# Patient Record
Sex: Female | Born: 1945
Health system: Southern US, Community
[De-identification: ages and names within clinical notes are randomized; demographics above are authoritative.]

## PROBLEM LIST (undated history)

## (undated) DIAGNOSIS — E669 Obesity, unspecified: Secondary | ICD-10-CM

## (undated) DIAGNOSIS — R7401 Elevation of levels of liver transaminase levels: Secondary | ICD-10-CM

## (undated) DIAGNOSIS — R740 Nonspecific elevation of levels of transaminase and lactic acid dehydrogenase [LDH]: Secondary | ICD-10-CM

## (undated) DIAGNOSIS — M1990 Unspecified osteoarthritis, unspecified site: Secondary | ICD-10-CM

## (undated) DIAGNOSIS — I10 Essential (primary) hypertension: Secondary | ICD-10-CM

## (undated) DIAGNOSIS — Z952 Presence of prosthetic heart valve: Secondary | ICD-10-CM

## (undated) DIAGNOSIS — E781 Pure hyperglyceridemia: Secondary | ICD-10-CM

## (undated) DIAGNOSIS — I872 Venous insufficiency (chronic) (peripheral): Secondary | ICD-10-CM

## (undated) DIAGNOSIS — Z853 Personal history of malignant neoplasm of breast: Secondary | ICD-10-CM

## (undated) DIAGNOSIS — Z96653 Presence of artificial knee joint, bilateral: Secondary | ICD-10-CM

## (undated) DIAGNOSIS — R011 Cardiac murmur, unspecified: Secondary | ICD-10-CM

## (undated) HISTORY — DX: Nonspecific elevation of levels of transaminase and lactic acid dehydrogenase (ldh): R74.0

## (undated) HISTORY — PX: REPLACEMENT TOTAL KNEE BILATERAL: SUR1225

## (undated) HISTORY — DX: Presence of artificial knee joint, bilateral: Z96.653

## (undated) HISTORY — DX: Presence of prosthetic heart valve: Z95.2

## (undated) HISTORY — DX: Venous insufficiency (chronic) (peripheral): I87.2

## (undated) HISTORY — DX: Personal history of malignant neoplasm of breast: Z85.3

## (undated) HISTORY — DX: Unspecified osteoarthritis, unspecified site: M19.90

## (undated) HISTORY — DX: Elevation of levels of liver transaminase levels: R74.01

## (undated) HISTORY — DX: Pure hyperglyceridemia: E78.1

## (undated) HISTORY — DX: Obesity, unspecified: E66.9

## (undated) HISTORY — DX: Cardiac murmur, unspecified: R01.1

## (undated) HISTORY — DX: Essential (primary) hypertension: I10

---

## 1984-04-18 HISTORY — PX: TOTAL ABDOMINAL HYSTERECTOMY: SHX209

## 1984-04-18 HISTORY — PX: APPENDECTOMY: SHX54

## 2010-05-03 ENCOUNTER — Encounter: Payer: Self-pay | Admitting: Family Medicine

## 2010-05-05 ENCOUNTER — Encounter: Payer: Self-pay | Admitting: Family Medicine

## 2010-05-05 ENCOUNTER — Ambulatory Visit
Admission: RE | Admit: 2010-05-05 | Discharge: 2010-05-05 | Payer: Self-pay | Source: Home / Self Care | Attending: Family Medicine | Admitting: Family Medicine

## 2010-05-05 DIAGNOSIS — E118 Type 2 diabetes mellitus with unspecified complications: Secondary | ICD-10-CM | POA: Insufficient documentation

## 2010-05-05 DIAGNOSIS — R740 Nonspecific elevation of levels of transaminase and lactic acid dehydrogenase [LDH]: Secondary | ICD-10-CM

## 2010-05-05 DIAGNOSIS — I10 Essential (primary) hypertension: Secondary | ICD-10-CM | POA: Insufficient documentation

## 2010-05-05 LAB — CONVERTED CEMR LAB
Alkaline Phosphatase: 71 units/L (ref 39–117)
BUN: 18 mg/dL (ref 6–23)
CO2: 28 meq/L (ref 19–32)
Cholesterol: 157 mg/dL (ref 0–200)
Creatinine, Ser: 0.78 mg/dL (ref 0.40–1.20)
Glucose, Bld: 154 mg/dL — ABNORMAL HIGH (ref 70–99)
HDL: 41 mg/dL (ref >39)
LDL Cholesterol: 92 mg/dL (ref 0–99)
Total Bilirubin: 0.6 mg/dL (ref 0.3–1.2)
Total CHOL/HDL Ratio: 3.8
Total Protein: 7.7 g/dL (ref 6.0–8.3)
Triglycerides: 118 mg/dL (ref <150)
VLDL: 24 mg/dL (ref 0–40)

## 2010-05-06 ENCOUNTER — Telehealth: Payer: Self-pay | Admitting: Family Medicine

## 2010-05-20 NOTE — Assessment & Plan Note (Signed)
Summary: routine checkup/vfw   Vital Signs:  Patient profile:   65 year old female Height:      63 inches Weight:      283 pounds BMI:     50.31 O2 Sat:      93 % on Room air Pulse rate:   77 / minute BP sitting:   152 / 89  (right arm) Cuff size:   large  Vitals Entered By: Francee Piccolo CMA Duncan Dull) (May 05, 2010 8:22 AM)  O2 Flow:  Room air CC: new to establish, refill glucovance//SP Is Patient Diabetic? Yes Did you bring your meter with you today? No   History of Present Illness:  Type 2 diabetes mellitus follow-up      This is a 65 year old woman who presents with Type 2 diabetes mellitus follow-up.  The patient denies polyuria, polydipsia, blurred vision, self managed hypoglycemia, hypoglycemia requiring help, weight loss, weight gain, and numbness of extremities.  She has intermittent burning/tingling in her toes.  Moderate compliance with diet, no regular exercise.  Checks glucose fasting AM: avg 130-140. Random later in day usually 140s.  No hypoglyc.  Rare gluc > 200.   Hips not bothering her lately at all, nor is her low back. She has her BP checked by a friend who is studying nursing and reports normal readings recently.  Past checks in my office have been mildly elevated more than   We discussed vaccines and routine screenings for her today and she declined everything despite being told of the potential risks/benefits of each (flu vaccine, mammogram, colon cancer screening, bone densitometry).  She did say she would do a bone density test but wants to put it off for now.  Preventive Screening-Counseling & Management  Alcohol-Tobacco     Alcohol drinks/day: 0     Smoking Status: quit  Current Medications (verified): 1)  Glucovance 5-500 Mg Tabs (Glyburide-Metformin) .... 2 Tabs By Mouth Bid 2)  Aspirin 81 Mg Tbec (Aspirin) .Marland Kitchen.. 1 Tab By Mouth Qd 3)  Vitamin E 1000 Unit Caps (Vitamin E) .... Take 1 Capsule By Mouth Once A Day 4)  Vitamin C Cr 1500 Mg  Cr-Tabs (Ascorbic Acid) .... Take 1 Tablet By Mouth Once A Day 5)  Cranberry 500 Mg Caps (Cranberry) .... Take 1 Capsule By Mouth Once A Day 6)  Coenzyme Q10 100 Mg Caps (Coenzyme Q10) .... Take 1 Capsule By Mouth Once A Day 7)  Biotin 300 Mcg Tabs (Biotin) .... Take 1 Tablet By Mouth Once A Day 8)  Niacin Flush Free 500 Mg Caps (Inositol Niacinate) .... Take 1 Capsule By Mouth Once A Day  Allergies (verified): 1)  ! Pcn  Past History:  Past Surgical History: TAH & Hysterectomy 1986 Appendectomy 1986  Family History: Mother: DM 2, CHF Father: CHF Twin brother: dysrythmia Brother: alcoholism  Younger brother: MS Sister: celiac disease  Social History: Smoking Status:  quit  Review of Systems  The patient denies anorexia, fever, weight loss, weight gain, vision loss, decreased hearing, hoarseness, chest pain, syncope, dyspnea on exertion, peripheral edema, prolonged cough, headaches, hemoptysis, abdominal pain, melena, hematochezia, severe indigestion/heartburn, hematuria, incontinence, genital sores, muscle weakness, suspicious skin lesions, transient blindness, difficulty walking, depression, unusual weight change, abnormal bleeding, enlarged lymph nodes, angioedema, and breast masses.    Physical Exam  General:  VS: noted, all normal except BP 152/89 Gen: Alert, well appearing, oriented x 4. HEENT: Scalp without lesions or hair loss.  Ears: EACs clear, normal epithelium.  TMs with good light reflex and landmarks bilaterally.  Eyes: no injection, icteris, swelling, or exudate.  EOMI, PERRLA. Nose: no drainage or turbinate edema/swelling.  No inection or focal lesion.  Mouth: lips without lesion/swelling.  Oral mucosa pink and moist.  Dentition intact and without obvious caries or gingival swelling.  Oropharynx without erythema, exudate, or swelling.  Neck: supple.  No lymphadenopathy, thyromegaly, or mass.  No bruits. Chest: symmetric expansion, with nonlabored respirations.   Clear and equal breath sounds in all lung fields.   CV: RRR, 1-2/6 systolic murmur heard best at RUSB.  S1 and S2 normal.  No /r/g.  Peripheral pulses 2+/symmetric. EXT: no clubbing, cyanosis, or edema.     Impression & Recommendations:  Problem # 1:  DIAB W/NEURO MANIFESTS TYPE II/UNS NOT UNCNTRL (ICD-250.60) Assessment Unchanged She is current on her urine microalbumin/cr and her eye exam. Continue current meds, encouraged stricter compliance with diet and START exercising.  Continue two times a day home gluc monitoring. Her updated medication list for this problem includes:    Glucovance 5-500 Mg Tabs (Glyburide-metformin) .Marland Kitchen... 2 tabs by mouth bid    Aspirin 81 Mg Tbec (Aspirin) .Marland Kitchen... 1 tab by mouth qd  Orders: T-Comprehensive Metabolic Panel [id number]) T-Lipid Profile [phone]) T- Hemoglobin A1C ([id number])  Problem # 2:  ELEVATED BP READING WITHOUT DX HYPERTENSION (ICD-796.2) Assessment: Unchanged Recommended EKG to look for LVH, but she refused this today (said she can't breath when lying on her back and also said she would not take off her blouse).   She'll continue home monitoring of her BP a few times a week and call or return if they are >140/90.  Problem # 3:  NONSPEC ELEVATION OF LEVELS OF TRANSAMINASE/LDH (ICD-790.4) Assessment: Unchanged These were barely 2X normal last check in 07/2009.  Presumably fatty liver, but pt not interested in w/u of this at this time.  Orders: T-Comprehensive Metabolic Panel ([id number])  Problem # 4:  OBESITY, MORBID (ICD-278.01) Assessment: Unchanged Her wt is 3 lb down from 07/2009. Emphasized importance of consistent compliance with diet/exercise...Marland KitchenMarland Kitchenlifestyle change.  Complete Medication List: 1)  Glucovance 5-500 Mg Tabs (Glyburide-metformin) .... 2 tabs by mouth bid 2)  Aspirin 81 Mg Tbec (Aspirin) .Marland Kitchen.. 1 tab by mouth qd 3)  Vitamin E 1000 Unit Caps (Vitamin e) .... Take 1 capsule by mouth once a day 4)  Vitamin C Cr  1500 Mg Cr-tabs (Ascorbic acid) .... Take 1 tablet by mouth once a day 5)  Cranberry 500 Mg Caps (Cranberry) .... Take 1 capsule by mouth once a day 6)  Coenzyme Q10 100 Mg Caps (Coenzyme q10) .... Take 1 capsule by mouth once a day 7)  Biotin 300 Mcg Tabs (Biotin) .... Take 1 tablet by mouth once a day 8)  Niacin Flush Free 500 Mg Caps (Inositol niacinate) .... Take 1 capsule by mouth once a day  Patient Instructions: 1)  Take orders to [facility] on [address]--(please give directions) 2)  Take a women's multivitamin daily and 3 tums daily. 3)  Recheck in office in 6 months. Prescriptions: GLUCOVANCE 5-500 MG TABS (GLYBURIDE-METFORMIN) 2 tabs by mouth bid  #360 x 1   Entered and Authorized by:   Michell Heinrich M.D.   Signed by:   Michell Heinrich M.D. on 05/05/2010   Method used:   Print then Give to Patient   RxID:   [id number]    Orders Added: 1)  T-Comprehensive Metabolic Panel [[id number]] 2)  T-Lipid Profile [[id number]] 3)  T- Hemoglobin A1C [[id number]] 4)  Est. Patient Level IV [04540]

## 2010-05-20 NOTE — Miscellaneous (Signed)
  Clinical Lists Changes  Medications: Added new medication of GLUCOVANCE 5-500 MG TABS (GLYBURIDE-METFORMIN) 2 tabs by mouth bid Added new medication of ASPIRIN 81 MG TBEC (ASPIRIN) 1 tab by mouth qd Observations: Added new observation of SOCIAL HX: Married, no children. Active in church ministry/missionary. No T/A/Ds. (05/03/2010 16:18) Added new observation of PAST MED HX: DM 2 (A1c 07/2009 8.7%.  Urine pro/cr NEG 07/2009.  Eye exam 08/2009, No D.R.) Obesity Osteoarthritis (low back, hips, knees) Hypertriglyceridemia Transaminasemia (mild 07/2009)--hep panel neg. (05/03/2010 16:18) Added new observation of PAST SURG HX: Hysterectomy 1986 (05/03/2010 16:18)       Past History:  Past Medical History: DM 2 (A1c 07/2009 8.7%.  Urine pro/cr NEG 07/2009.  Eye exam 08/2009, No D.R.) Obesity Osteoarthritis (low back, hips, knees) Hypertriglyceridemia Transaminasemia (mild 07/2009)--hep panel neg.  Past Surgical History: Hysterectomy 1986   Social History: Married, no children. Active in church ministry/missionary. No T/A/Ds.

## 2010-05-20 NOTE — Progress Notes (Signed)
Summary: Lab results  Phone Note Other Incoming   Summary of Call: Please notify: her labs showed that her cholesterol level, kidney function, and liver function are all normal.  This is great.  However, her HbA1c---the number that shows how well her sugar has been controlled over the last 3 months---was 8.1%.  This means her average sugar level during this time was 180.  This is better than back in April 2011 when we last checked it, but her goal is to get this down below 7%. Tell her I encourage her to start exercising (she can start with a daily walk, start small and work up to 30 minutes every day), and try to remain faithful to a strict diabetic diet.  Keep taking the glucovance like she is doing, and I also recommend an additional diabetes pill: start Onglyza 2.5mg  once daily.  Continue checking sugar twice per day.  Follow up in office in 459mo instead of 59mo.  If she agrees to the med, let me know and I'll do e-RX.  Also, please mail her a HbA1c info sheet that I'll put on your keyboard.  Thanks Initial call taken by: Michell Heinrich M.D.,  May 06, 2010 8:31 AM  Follow-up for Phone Call        LM to [facility] at home number Francee Piccolo CMA Duncan Dull)  May 06, 2010 4:52 PM   RC from pt.  Notified of above.  She is agreeable with this plan.  Pt requests a copy of labs to be mailed to her. Labs printed and mailed.  Onglyza rx sent. Follow-up by: Francee Piccolo CMA (AAMA),  May 07, 2010 9:08 AM    New/Updated Medications: ONGLYZA 2.5 MG TABS (SAXAGLIPTIN HCL) Take 1 tablet by mouth once a day Prescriptions: ONGLYZA 2.5 MG TABS (SAXAGLIPTIN HCL) Take 1 tablet by mouth once a day  #30 x 2   Entered by:   Francee Piccolo CMA (AAMA)   Authorized by:   Michell Heinrich M.D.   Signed by:   Francee Piccolo CMA (AAMA) on 05/07/2010   Method used:   Electronically to        [facility]* (retail)       [address] [place],  [place]  [address]       Ph: [phone]       Fax: [phone]   RxID:   [id number]

## 2010-06-25 ENCOUNTER — Encounter: Payer: Self-pay | Admitting: *Deleted

## 2010-08-27 ENCOUNTER — Telehealth: Payer: Self-pay | Admitting: *Deleted

## 2010-08-27 DIAGNOSIS — E1149 Type 2 diabetes mellitus with other diabetic neurological complication: Secondary | ICD-10-CM

## 2010-08-27 MED ORDER — SAXAGLIPTIN HCL 2.5 MG PO TABS: 2.5000 mg | ORAL_TABLET | Freq: Every day | ORAL | Status: DC

## 2010-08-27 NOTE — Telephone Encounter (Signed)
Pt needs refill of Onglyza until follow up in July.  Pt states she has started walking 5 minutes twice a day.  Her FBS's are down to 98-112 and her 3pm CBG's are between 110-120.  She is feeling better and can't wait to see what numbers are in July.  Refill sent thru July.

## 2010-10-27 ENCOUNTER — Ambulatory Visit (INDEPENDENT_AMBULATORY_CARE_PROVIDER_SITE_OTHER): Payer: Medicare Other | Admitting: Family Medicine

## 2010-10-27 ENCOUNTER — Encounter: Payer: Self-pay | Admitting: Family Medicine

## 2010-10-27 VITALS — BP 137/78 | HR 79 | Ht 63.0 in | Wt 277.0 lb

## 2010-10-27 DIAGNOSIS — R7402 Elevation of levels of lactic acid dehydrogenase (LDH): Secondary | ICD-10-CM

## 2010-10-27 DIAGNOSIS — R7401 Elevation of levels of liver transaminase levels: Secondary | ICD-10-CM

## 2010-10-27 DIAGNOSIS — B351 Tinea unguium: Secondary | ICD-10-CM

## 2010-10-27 DIAGNOSIS — Z23 Encounter for immunization: Secondary | ICD-10-CM

## 2010-10-27 DIAGNOSIS — E1149 Type 2 diabetes mellitus with other diabetic neurological complication: Secondary | ICD-10-CM

## 2010-10-27 DIAGNOSIS — E119 Type 2 diabetes mellitus without complications: Secondary | ICD-10-CM

## 2010-10-27 DIAGNOSIS — I872 Venous insufficiency (chronic) (peripheral): Secondary | ICD-10-CM

## 2010-10-27 LAB — BASIC METABOLIC PANEL
BUN: 15 mg/dL (ref 6–23)
Calcium: 10.1 mg/dL (ref 8.4–10.5)
Creat: 0.89 mg/dL (ref 0.50–1.10)
Glucose, Bld: 147 mg/dL — ABNORMAL HIGH (ref 70–99)

## 2010-10-27 LAB — MICROALBUMIN / CREATININE URINE RATIO: Microalb Creat Ratio: 9.8 mg/g (ref 0.0–30.0)

## 2010-10-27 LAB — HEPATIC FUNCTION PANEL
AST: 28 U/L (ref 0–37)
Alkaline Phosphatase: 68 U/L (ref 39–117)
Indirect Bilirubin: 0.4 mg/dL (ref 0.0–0.9)
Total Bilirubin: 0.6 mg/dL (ref 0.3–1.2)

## 2010-10-27 LAB — HEMOGLOBIN A1C: Hgb A1c MFr Bld: 7 % — ABNORMAL HIGH (ref <5.7)

## 2010-10-27 MED ORDER — ZOSTER VACCINE LIVE 19400 UNT/0.65ML ~~LOC~~ SOLR: 0.6500 mL | Freq: Once | SUBCUTANEOUS | Status: DC

## 2010-10-27 MED ORDER — FUROSEMIDE 40 MG PO TABS: 40.0000 mg | ORAL_TABLET | Freq: Every day | ORAL | Status: DC

## 2010-10-27 MED ORDER — TERBINAFINE HCL 250 MG PO TABS: ORAL_TABLET | ORAL | Status: DC

## 2010-10-27 MED ORDER — GLYBURIDE-METFORMIN 5-500 MG PO TABS: 2.0000 | ORAL_TABLET | Freq: Two times a day (BID) | ORAL | Status: DC

## 2010-10-27 MED ORDER — TETANUS-DIPHTH-ACELL PERTUSSIS 5-2.5-18.5 LF-MCG/0.5 IM SUSP: 0.5000 mL | Freq: Once | INTRAMUSCULAR | Status: DC

## 2010-10-27 NOTE — Assessment & Plan Note (Signed)
Will call in lamisil tabs x 12 wks. Need to add on hepatic panel to labs today for baseline prior to starting lamisil since she does have hx of mild transaminasemia.

## 2010-10-27 NOTE — Progress Notes (Signed)
OFFICE VISIT  10/27/2010   CC:  Chief Complaint  Patient presents with  . Hypertension  . Diabetes     HPI:    Patient is a 65 y.o. Caucasian female who presents for DM 2 f/u. Says glucoses (checks tid) are much better: usually around 100 fasting, no higher than 130s later in the day except very rare 170s reading. Has stopped the onglyza about 3 wks ago b/c sugars doing good.  Has decreased her glucovance to 1 tab bid instead of 2 bid. Has made some dietary changes, nutrisystem--moderately high protein diet lately.  Fasting today.  Says feet (mainly toes) do tingle and burn constantly.  Also struggles with chronic LE edema bilat. Exercise: walks regularly.  Past Medical History  Diagnosis Date  . Diabetes mellitus     Type 2 (A1C 04/2010 8.1%.  Urine pro/cr Neg 4-11.)  . Obesity   . Osteoarthritis     low back, hips, knees  . Hypertriglyceridemia   . Transaminasemia mild 4/11    hep panel- neg    Past Surgical History  Procedure Date  . Total abdominal hysterectomy 1986  . Appendectomy 1986    Outpatient Prescriptions Prior to Visit  Medication Sig Dispense Refill  . Ascorbic Acid (VITAMIN C CR) 1500 MG TBCR Take 1 tablet by mouth daily.        Marland Kitchen aspirin 81 MG tablet Take 81 mg by mouth daily.        . Biotin 300 MCG TABS Take 1 tablet by mouth daily.        . Coenzyme Q10 100 MG CPCR Take 1 capsule by mouth.       . vitamin E 1000 UNIT capsule Take 1,000 Units by mouth daily.        . Cranberry 500 MG CAPS Take 1 capsule by mouth daily.        . Inositol Niacinate (NIACIN FLUSH FREE) 500 MG CAPS Take 1 capsule by mouth daily.        Marland Kitchen glyBURIDE-metformin (GLUCOVANCE) 5-500 MG per tablet Take 2 tablets by mouth 2 (two) times daily.        . saxagliptin HCl (ONGLYZA) 2.5 MG TABS tablet Take 1 tablet (2.5 mg total) by mouth daily.  30 tablet  1   No facility-administered medications prior to visit.    Allergies  Allergen Reactions  . Penicillins     REACTION:  neck/throat swelling    ROS As per HPI  PE: Blood pressure 137/78, pulse 79, height 5\' 3"  (1.6 m), weight 277 lb (125.646 kg). Gen: Alert, well appearing.  Patient is oriented to person, place, time, and situation. LEGS: 2+ pitting edema in both lower legs, with some varicosities and mild skin freckling and hyperkeratosis from chronic venous insuff edema. Feet pink, diffusely flaky, one small callus in metatarsal head region on left plantar surface.  Mildly TTP here.  No ulceration.  No deformity. Nails all thickened and crumbling.  Sensation intact.  Puffiness of feet and ankles prevent adequate pulse check.  LABS:  None today  IMPRESSION AND PLAN:  DIAB W/NEURO MANIFESTS TYPE II/UNS NOT UNCNTRL Improved as per her home monitoring. Check HbA1c today and check annual urine microalb/cr ratio today.  Chronic venous insufficiency Discussed dx today, discussed low Na recommendations, compression hose (she'll get these herself), lasix up to 3x per week prn, elevation of legs. Check urine protein today as well as BMET.  Need for Tdap vaccination TdaP today. Also gave rx for zostavax.  We'll give her pneumovax at next visit--she agreed to this.  Onychomycosis Will call in lamisil tabs x 12 wks. Need to add on hepatic panel to labs today for baseline prior to starting lamisil since she does have hx of mild transaminasemia.     FOLLOW UP: Return in about 6 months (around 04/29/2011) for f/u DM and venous insufficiency.

## 2010-10-27 NOTE — Assessment & Plan Note (Signed)
Improved as per her home monitoring. Check HbA1c today and check annual urine microalb/cr ratio today.

## 2010-10-27 NOTE — Progress Notes (Signed)
Addended by: Francee Piccolo C on: 10/27/2010 12:10 PM   Modules accepted: Orders

## 2010-10-27 NOTE — Assessment & Plan Note (Signed)
TdaP today. Also gave rx for zostavax. We'll give her pneumovax at next visit--she agreed to this.

## 2010-10-27 NOTE — Assessment & Plan Note (Signed)
Discussed dx today, discussed low Na recommendations, compression hose (she'll get these herself), lasix up to 3x per week prn, elevation of legs. Check urine protein today as well as BMET.

## 2010-11-01 ENCOUNTER — Telehealth: Payer: Self-pay | Admitting: Family Medicine

## 2010-11-01 NOTE — Telephone Encounter (Signed)
Judeth Cornfield mailed out labs on the 12th of July

## 2010-11-01 NOTE — Telephone Encounter (Signed)
Wants lab results mailed from last visit 7.11.12.

## 2010-11-04 ENCOUNTER — Ambulatory Visit: Payer: Self-pay | Admitting: Family Medicine

## 2011-04-29 ENCOUNTER — Ambulatory Visit: Payer: Medicare Other | Admitting: Family Medicine

## 2011-06-07 ENCOUNTER — Telehealth: Payer: Self-pay | Admitting: *Deleted

## 2011-06-07 NOTE — Telephone Encounter (Signed)
Faxed request received for 90 day supply of glucovance.  RX denied.  Pt is due for follow up.

## 2011-07-21 ENCOUNTER — Ambulatory Visit (INDEPENDENT_AMBULATORY_CARE_PROVIDER_SITE_OTHER): Payer: Medicare Other | Admitting: Family Medicine

## 2011-07-21 ENCOUNTER — Encounter: Payer: Self-pay | Admitting: Family Medicine

## 2011-07-21 VITALS — BP 168/81 | HR 80 | Temp 97.6°F | Ht 63.0 in | Wt 279.0 lb

## 2011-07-21 DIAGNOSIS — E1149 Type 2 diabetes mellitus with other diabetic neurological complication: Secondary | ICD-10-CM

## 2011-07-21 DIAGNOSIS — R030 Elevated blood-pressure reading, without diagnosis of hypertension: Secondary | ICD-10-CM

## 2011-07-21 DIAGNOSIS — E1142 Type 2 diabetes mellitus with diabetic polyneuropathy: Secondary | ICD-10-CM

## 2011-07-21 DIAGNOSIS — G609 Hereditary and idiopathic neuropathy, unspecified: Secondary | ICD-10-CM

## 2011-07-21 DIAGNOSIS — Z23 Encounter for immunization: Secondary | ICD-10-CM

## 2011-07-21 DIAGNOSIS — G629 Polyneuropathy, unspecified: Secondary | ICD-10-CM

## 2011-07-21 DIAGNOSIS — E119 Type 2 diabetes mellitus without complications: Secondary | ICD-10-CM

## 2011-07-21 DIAGNOSIS — I872 Venous insufficiency (chronic) (peripheral): Secondary | ICD-10-CM

## 2011-07-21 DIAGNOSIS — B351 Tinea unguium: Secondary | ICD-10-CM

## 2011-07-21 LAB — COMPREHENSIVE METABOLIC PANEL
ALT: 16 U/L (ref 0–35)
AST: 16 U/L (ref 0–37)
Alkaline Phosphatase: 67 U/L (ref 39–117)
Creatinine, Ser: 0.8 mg/dL (ref 0.4–1.2)
GFR: 72.02 mL/min (ref >60.00)
Sodium: 142 mEq/L (ref 135–145)
Total Bilirubin: 0.6 mg/dL (ref 0.3–1.2)

## 2011-07-21 LAB — LIPID PANEL
HDL: 31.8 mg/dL — ABNORMAL LOW (ref >39.00)
Total CHOL/HDL Ratio: 5
VLDL: 24 mg/dL (ref 0.0–40.0)

## 2011-07-21 LAB — HEMOGLOBIN A1C: Hgb A1c MFr Bld: 6.8 % — ABNORMAL HIGH (ref 4.6–6.5)

## 2011-07-21 MED ORDER — UREA 40 % EX FOAM: CUTANEOUS | Status: DC

## 2011-07-21 MED ORDER — GABAPENTIN 300 MG PO CAPS: 300.0000 mg | ORAL_CAPSULE | Freq: Three times a day (TID) | ORAL | Status: DC

## 2011-07-21 NOTE — Progress Notes (Signed)
OFFICE VISIT  07/24/2011   CC:  Chief Complaint  Patient presents with  . Diabetes    follow up, foot exam     HPI:    Patient is a 66 y.o. Caucasian female who presents for 8mo f/u DM 2, obesity, chronic venous insufficiency. Glucoses avg 90s-120s fasting, 150s avg 2H PP, rarely has numbers up to 200 anymore.  Diabetic diet but no exercise: takes 1-2 glucovance qhs.   Describes constant nagging burning on bottoms of feet and in toes of both feet, unclear how long but sounds like greater than a year and getting worse.  She cannot exercise, although sitting with legs elevated doesn't alleviate the pain any.  Denies numbness but says they do tingle sometimes.     Past Medical History  Diagnosis Date  . Diabetes mellitus     Type 2  . Obesity   . Osteoarthritis     low back, hips, knees  . Hypertriglyceridemia   . Transaminasemia mild 4/11    hep panel- neg  . Elevated blood pressure reading without diagnosis of hypertension     Past Surgical History  Procedure Date  . Total abdominal hysterectomy 1986  . Appendectomy 1986    Outpatient Prescriptions Prior to Visit  Medication Sig Dispense Refill  . Ascorbic Acid (VITAMIN C CR) 1500 MG TBCR Take 1 tablet by mouth daily.        Marland Kitchen aspirin 81 MG tablet Take 81 mg by mouth daily.        . Biotin 300 MCG TABS Take 1 tablet by mouth daily.        . Coenzyme Q10 100 MG CPCR Take 1 capsule by mouth.       . Cranberry 500 MG CAPS Take 1 capsule by mouth daily.        . furosemide (LASIX) 40 MG tablet Take 1 tablet (40 mg total) by mouth daily.  30 tablet  3  . glyBURIDE-metformin (GLUCOVANCE) 5-500 MG per tablet Take 2 tablets by mouth 2 (two) times daily.  360 tablet  1  . Inositol Niacinate (NIACIN FLUSH FREE) 500 MG CAPS Take 1 capsule by mouth daily.        . Pyridoxine HCl (VITAMIN B-6) 500 MG tablet Take 500 mg by mouth daily.        . vitamin E 1000 UNIT capsule Take 1,000 Units by mouth daily.        Marland Kitchen terbinafine  (LAMISIL) 250 MG tablet 1 tab po qd x 12 wks  30 tablet  3  . zoster vaccine live, PF, (ZOSTAVAX) 16109 UNT/0.65ML injection Inject 19,400 Units into the skin once.  1 vial  0    Allergies  Allergen Reactions  . Penicillins     REACTION: neck/throat swelling    ROS As per HPI  PE: Blood pressure 168/81, pulse 80, temperature 97.6 F (36.4 C), temperature source Temporal, height 5\' 3"  (1.6 m), weight 279 lb (126.554 kg). Gen: Alert, well appearing, obese white female.  Patient is oriented to person, place, time, and situation. CV: RRR, 2/6 systolic ejection murmur, without rub or gallop.   Chest with symmetric expansion, good aeration, nonlabored respirations.  Clear and equal breath sounds in all lung fields.  No clubbing or cyanosis. LEGS: 2+ pitting edema from knees down to feet bilat, with hemosiderin/pigment skin changes and some hyperkeratosis consistent with venous stasis dermatitis.   Feet: sensation is intact.  Very sensitive to touch on plantar surfaces.  Some mild  calluses scattered about.  No skin breakdown.  Toenails on left foot normal, with toenails on right foot having some thickening/hypertrophic changes distally but proximally they look healthy.   LABS:  none  IMPRESSION AND PLAN:  DIAB W/NEURO MANIFESTS TYPE II/UNS NOT UNCNTRL Control sounds good. She has significant diabetic neuropathy sx's + chronic venous insufficiency changes complicating things (unable to exercise AT ALL). Check HbA1c today.   Has never had diab retinpthy screening exam and she said she'll schedule this soon.   Pneumovax given today.  Diabetic peripheral neuropathy Trial of neurontin: 300mg  qhs and titrate up to 300mg  tid.  Call with report of progress in 1 month. Rx for diabetic shoes, although I suspect she may need podiatrist eval and special custum shoes made for her feet--she declined this referral today and wants to try "regular" diabetic shoes first for  now.  Onychomycosis Resolving s/p terbinafine course.  ELEVATED BP READING WITHOUT DX HYPERTENSION Encouraged her to check BP at home with large cuff on upper arm, which she says she has access to. No meds started as of yet.  Chronic venous insufficiency Start urea foam bid 35-40%. Reiterated the importance of sodium limitation, elevation of legs prn.  May continue qd lasix.  OBESITY, MORBID Nutrisystem helps her lose wt, but she often gains it back quickly b/c she doesn't remain on the system. She is unable to exercise at all due to her feet pain + wt.     FOLLOW UP: Return for 4-6 for f/u DM 2 + neuropathy.

## 2011-07-21 NOTE — Patient Instructions (Signed)
Check blood pressure once daily and call if top number is consistently >140 or bottom number is consistently >90.

## 2011-07-24 ENCOUNTER — Encounter: Payer: Self-pay | Admitting: Family Medicine

## 2011-07-24 DIAGNOSIS — E1142 Type 2 diabetes mellitus with diabetic polyneuropathy: Secondary | ICD-10-CM | POA: Insufficient documentation

## 2011-07-24 NOTE — Assessment & Plan Note (Addendum)
Start urea foam bid 35-40%. Reiterated the importance of sodium limitation, elevation of legs prn.  May continue qd lasix.

## 2011-07-24 NOTE — Assessment & Plan Note (Signed)
Resolving s/p terbinafine course.

## 2011-07-24 NOTE — Assessment & Plan Note (Signed)
Encouraged her to check BP at home with large cuff on upper arm, which she says she has access to. No meds started as of yet.

## 2011-07-24 NOTE — Assessment & Plan Note (Signed)
Control sounds good. She has significant diabetic neuropathy sx's + chronic venous insufficiency changes complicating things (unable to exercise AT ALL). Check HbA1c today.   Has never had diab retinpthy screening exam and she said she'll schedule this soon.   Pneumovax given today.

## 2011-07-24 NOTE — Assessment & Plan Note (Signed)
Nutrisystem helps her lose wt, but she often gains it back quickly b/c she doesn't remain on the system. She is unable to exercise at all due to her feet pain + wt.

## 2011-07-24 NOTE — Assessment & Plan Note (Signed)
Trial of neurontin: 300mg  qhs and titrate up to 300mg  tid.  Call with report of progress in 1 month. Rx for diabetic shoes, although I suspect she may need podiatrist eval and special custum shoes made for her feet--she declined this referral today and wants to try "regular" diabetic shoes first for now.

## 2011-07-27 ENCOUNTER — Telehealth: Payer: Self-pay | Admitting: Family Medicine

## 2011-07-27 NOTE — Telephone Encounter (Signed)
RC to work #--pt has already left for the day.  Message left at home number to [facility] at earliest convenience.

## 2011-08-15 ENCOUNTER — Encounter: Payer: Self-pay | Admitting: Family Medicine

## 2011-08-15 ENCOUNTER — Ambulatory Visit (INDEPENDENT_AMBULATORY_CARE_PROVIDER_SITE_OTHER): Payer: Medicare Other | Admitting: Family Medicine

## 2011-08-15 VITALS — BP 158/81 | HR 80 | Ht 63.0 in | Wt 281.0 lb

## 2011-08-15 DIAGNOSIS — K1370 Unspecified lesions of oral mucosa: Secondary | ICD-10-CM

## 2011-08-15 DIAGNOSIS — R4586 Emotional lability: Secondary | ICD-10-CM

## 2011-08-15 DIAGNOSIS — T50905A Adverse effect of unspecified drugs, medicaments and biological substances, initial encounter: Secondary | ICD-10-CM

## 2011-08-15 DIAGNOSIS — T887XXA Unspecified adverse effect of drug or medicament, initial encounter: Secondary | ICD-10-CM

## 2011-08-15 DIAGNOSIS — K1379 Other lesions of oral mucosa: Secondary | ICD-10-CM

## 2011-08-15 NOTE — Telephone Encounter (Signed)
Pt has appt today.  We will discuss at appointment.

## 2011-08-15 NOTE — Patient Instructions (Signed)
Take lyrica 75mg  twice daily for 7d, then increase to TWO of the 75mg  tabs twice daily and continue this dosing until next follow up appt.

## 2011-08-15 NOTE — Progress Notes (Signed)
OFFICE NOTE  08/15/2011  CC:  Chief Complaint  Patient presents with  . Mouth Lesions    sore on tongue since last week     HPI: Patient is a 66 y.o. Caucasian female who is here for mouth sores.  I saw her a couple of weeks ago and started her on neurontin for diabetic peripheral neuropathy and she says the sores started after starting this med.  Also notes increased emotional lability/sadness/crying for no reason since being on the med. She noted MUCH improvement in her feet neuropathy sx's.  She stopped taking the med 3 days ago and sx's are abating some.  Pertinent PMH:  Past Medical History  Diagnosis Date  . Diabetes mellitus     Type 2  . Obesity   . Osteoarthritis     low back, hips, knees  . Hypertriglyceridemia   . Transaminasemia mild 4/11    hep panel- neg  . Elevated blood pressure reading without diagnosis of hypertension     MEDS:  Outpatient Prescriptions Prior to Visit  Medication Sig Dispense Refill  . Ascorbic Acid (VITAMIN C CR) 1500 MG TBCR Take 1 tablet by mouth daily.        Marland Kitchen aspirin 81 MG tablet Take 81 mg by mouth daily.        . Biotin 300 MCG TABS Take 1 tablet by mouth daily.        . Coenzyme Q10 100 MG CPCR Take 1 capsule by mouth.       . Cranberry 500 MG CAPS Take 1 capsule by mouth daily.        . furosemide (LASIX) 40 MG tablet Take 1 tablet (40 mg total) by mouth daily.  30 tablet  3  . gabapentin (NEURONTIN) 300 MG capsule Take 1 capsule (300 mg total) by mouth 3 (three) times daily.  90 capsule  3  . glyBURIDE-metformin (GLUCOVANCE) 5-500 MG per tablet Take 2 tablets by mouth 2 (two) times daily.  360 tablet  1  . Inositol Niacinate (NIACIN FLUSH FREE) 500 MG CAPS Take 1 capsule by mouth daily.        . Pyridoxine HCl (VITAMIN B-6) 500 MG tablet Take 500 mg by mouth daily.        . vitamin E 1000 UNIT capsule Take 1,000 Units by mouth daily.        . Urea 40 % FOAM Apply to lower legs and feet twice daily  150 g  6    PE: Blood  pressure 158/81, pulse 80, height 5\' 3"  (1.6 m), weight 281 lb (127.461 kg). Gen: Alert, well appearing.  Patient is oriented to person, place, time, and situation. Mouth: right side of tongue with 2 mm nodular/fleshy lesion that is nontender, tiny ulceration just above this spot.  No other oral abnormalities.  IMPRESSION AND PLAN: Mouth lesion: unclear etiology but presumably due to neurontin. It is resolving.  Emotional lability also noted on neurontin at just 300mg  qd--but it was helping wonderfully. Will start trial of lyrica--samples given today-75mg  tabs. Take lyrica 75mg  twice daily for 7d, then increase to TWO of the 75mg  tabs twice daily and continue this dosing until next follow up appt.   Elevated bp: I think she has HTN but she's not been great at trying to document home bps.  FOLLOW UP: routine

## 2011-12-15 ENCOUNTER — Encounter: Payer: Self-pay | Admitting: Family Medicine

## 2011-12-15 ENCOUNTER — Ambulatory Visit (INDEPENDENT_AMBULATORY_CARE_PROVIDER_SITE_OTHER): Payer: Medicare Other | Admitting: Family Medicine

## 2011-12-15 VITALS — BP 159/80 | HR 87 | Ht 63.0 in | Wt 274.0 lb

## 2011-12-15 DIAGNOSIS — R030 Elevated blood-pressure reading, without diagnosis of hypertension: Secondary | ICD-10-CM

## 2011-12-15 DIAGNOSIS — E1142 Type 2 diabetes mellitus with diabetic polyneuropathy: Secondary | ICD-10-CM

## 2011-12-15 DIAGNOSIS — E119 Type 2 diabetes mellitus without complications: Secondary | ICD-10-CM

## 2011-12-15 DIAGNOSIS — E1149 Type 2 diabetes mellitus with other diabetic neurological complication: Secondary | ICD-10-CM

## 2011-12-15 DIAGNOSIS — R011 Cardiac murmur, unspecified: Secondary | ICD-10-CM

## 2011-12-15 LAB — HEMOGLOBIN A1C: Hgb A1c MFr Bld: 7 % — ABNORMAL HIGH (ref 4.6–6.5)

## 2011-12-15 MED ORDER — GLYBURIDE-METFORMIN 5-500 MG PO TABS: 2.0000 | ORAL_TABLET | Freq: Two times a day (BID) | ORAL | Status: DC

## 2011-12-15 MED ORDER — GABAPENTIN 300 MG PO CAPS: ORAL_CAPSULE | ORAL | Status: DC

## 2011-12-15 NOTE — Assessment & Plan Note (Signed)
Stable.   Check HbA1c and urine micro/cr today. Continue neurontin 300mg  qod for DPN. She is due for Diab retin screening exam soon.

## 2011-12-15 NOTE — Progress Notes (Signed)
OFFICE NOTE  12/15/2011  CC:  Chief Complaint  Patient presents with  . Follow-up    neuropathy, lyrica     HPI: Patient is a 66 y.o. Caucasian female who is here for 4 mo f/u visit for DM 2 and obesity. We started her on lyrica 75mg  bid last visit, with hopes of increasing to 150 bid for DPN.  She finds the qod regimen of lyrica OR qod neurontin tolerable (no emotional lability) and HELPS THE PAIN a lot, also feels like her sensation in feet is improved.  Only two home bp checks since last visit: normal per her report.  Lately has had URI/cough x 1 wk, slowly getting better: she is taking OTC cold meds with decongestants.  Fasting gluc 104-142.  Occasional 3 pm checks are 130-140 range. She is dieting some lately.  Very little exercise/physical activity.  Pertinent PMH:  Past Medical History  Diagnosis Date  . Diabetes mellitus     Type 2  . Obesity   . Osteoarthritis     low back, hips, knees  . Hypertriglyceridemia   . Transaminasemia mild 4/11    hep panel- neg  . Elevated blood pressure reading without diagnosis of hypertension     MEDS:  Outpatient Prescriptions Prior to Visit  Medication Sig Dispense Refill  . Ascorbic Acid (VITAMIN C CR) 1500 MG TBCR Take 1 tablet by mouth daily.        Marland Kitchen aspirin 81 MG tablet Take 81 mg by mouth daily.        . Biotin 300 MCG TABS Take 1 tablet by mouth daily.        . Coenzyme Q10 100 MG CPCR Take 1 capsule by mouth.       . furosemide (LASIX) 40 MG tablet Take 1 tablet (40 mg total) by mouth daily.  30 tablet  3  . glyBURIDE-metformin (GLUCOVANCE) 5-500 MG per tablet Take 2 tablets by mouth 2 (two) times daily.  360 tablet  1  . Inositol Niacinate (NIACIN FLUSH FREE) 500 MG CAPS Take 1 capsule by mouth daily.        . Pyridoxine HCl (VITAMIN B-6) 500 MG tablet Take 500 mg by mouth daily.        . vitamin E 1000 UNIT capsule Take 1,000 Units by mouth daily.        . Cranberry 500 MG CAPS Take 1 capsule by mouth daily.        Marland Kitchen  gabapentin (NEURONTIN) 300 MG capsule Take 1 capsule (300 mg total) by mouth 3 (three) times daily.  90 capsule  3  **Note: not taking neurontin 300 tid as listed above  PE: Blood pressure 159/80, pulse 87, height 5\' 3"  (1.6 m), weight 274 lb (124.286 kg). VS: noted--normal. Gen: alert, NAD, NONTOXIC APPEARING. HEENT: eyes without injection, drainage, or swelling.  Ears: EACs clear, TMs with normal light reflex and landmarks.  Nose: Clear rhinorrhea, with some dried, crusty exudate adherent to mildly injected mucosa.  No purulent d/c.  No paranasal sinus TTP.  No facial swelling.  Throat and mouth without focal lesion.  No pharyngial swelling, erythema, or exudate.   Neck: supple, no LAD.   LUNGS: CTA bilat, nonlabored resps.   CV: RRR, 2/6 systolic murmur best heard at RUSB, S1 and S 2 normal.  No diastolic murmur.  No rub or gallop. EXT: no c/c/e SKIN: no rash   IMPRESSION AND PLAN:  DIAB W/NEURO MANIFESTS TYPE II/UNS NOT UNCNTRL Stable.  Check HbA1c and urine micro/cr today. Continue neurontin 300mg  qod for DPN. She is due for Diab retin screening exam soon.  ELEVATED BP READING WITHOUT DX HYPERTENSION Again, stressed the importance of home bp monitoring. At this point, neither of [place] is willing to start bp med based on her measurements only in office.  OBESITY, MORBID Continue to work on consistent calorie restriction and increase physical activity.  Encouraged pt for her wt loss so far.  Heart murmur, systolic I have documented this in my exams for her since I started seeing her 04/2010. No progression of the murmur.  Pulses symmetric, pt without DOE, chest pain, dizziness, or palpitations. We discussed possible causes of the murmur, discussed option of doing echocardiogram. At this point patient is most comfortable with watchful waiting approach--no imaging at this time.  Of note, in the past when I attempted to get an EKG on her in the office she refused b/c she would have to  take off her blouse to get it done and she was not willing to do this.     FOLLOW UP: 54mo

## 2011-12-15 NOTE — Assessment & Plan Note (Signed)
I have documented this in my exams for her since I started seeing her 04/2010. No progression of the murmur.  Pulses symmetric, pt without DOE, chest pain, dizziness, or palpitations. We discussed possible causes of the murmur, discussed option of doing echocardiogram. At this point patient is most comfortable with watchful waiting approach--no imaging at this time.  Of note, in the past when I attempted to get an EKG on her in the office she refused b/c she would have to take off her blouse to get it done and she was not willing to do this.

## 2011-12-15 NOTE — Assessment & Plan Note (Signed)
Continue to work on consistent calorie restriction and increase physical activity.  Encouraged pt for her wt loss so far.

## 2011-12-15 NOTE — Assessment & Plan Note (Signed)
Again, stressed the importance of home bp monitoring. At this point, neither of [place] is willing to start bp med based on her measurements only in office.

## 2012-04-16 ENCOUNTER — Encounter: Payer: Self-pay | Admitting: Family Medicine

## 2012-04-16 ENCOUNTER — Ambulatory Visit (INDEPENDENT_AMBULATORY_CARE_PROVIDER_SITE_OTHER): Payer: Medicare Other | Admitting: Family Medicine

## 2012-04-16 VITALS — BP 160/81 | HR 89 | Ht 63.0 in | Wt 274.0 lb

## 2012-04-16 DIAGNOSIS — R030 Elevated blood-pressure reading, without diagnosis of hypertension: Secondary | ICD-10-CM

## 2012-04-16 DIAGNOSIS — Z23 Encounter for immunization: Secondary | ICD-10-CM

## 2012-04-16 DIAGNOSIS — M25519 Pain in unspecified shoulder: Secondary | ICD-10-CM

## 2012-04-16 DIAGNOSIS — E1149 Type 2 diabetes mellitus with other diabetic neurological complication: Secondary | ICD-10-CM

## 2012-04-16 DIAGNOSIS — R011 Cardiac murmur, unspecified: Secondary | ICD-10-CM

## 2012-04-16 NOTE — Progress Notes (Signed)
OFFICE NOTE  04/16/2012  CC:  Chief Complaint  Patient presents with  . Follow-up    HTN, DM, DPN     HPI: Patient is a 66 y.o. Caucasian female who is here for 4 mo f/u DM 2, elevated bp w/out dx of HTN, and obesity. Feeling well other than some bilateral shoulder/upper arm soreness that has been occurring intermittently/randomly, not daily, for about the last 2 months.  Notices it more when it is damp outside.  Also notes right great troch area hurts sometimes, esp after sleeping on it all night.  TAkes no meds for these things. HS glucose avg 150s Fasting avg: 110-120. LE periph neuro sx's still well controlled with her use of lyrica and neurontin (she seems to alternate between these two meds; does not use them at the same time).  No outside bp's to report. 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  She will be looking for another job.  This has her stressed out, on top of her usual nervousness/discomfort at being in the doctor's office today.  Pertinent PMH:  Past Medical History  Diagnosis Date  . Diabetes mellitus     Type 2  . Obesity   . Osteoarthritis     low back, hips, knees  . Hypertriglyceridemia   . Transaminasemia mild 4/11    hep panel- neg  . Elevated blood pressure reading without diagnosis of hypertension    Past surgical, social, and family history reviewed and no changes noted since last office visit.  MEDS:  Outpatient Prescriptions Prior to Visit  Medication Sig Dispense Refill  . Ascorbic Acid (VITAMIN C CR) 1500 MG TBCR Take 1 tablet by mouth daily.        Marland Kitchen aspirin 81 MG tablet Take 81 mg by mouth daily.        . Biotin 300 MCG TABS Take 1 tablet by mouth daily.        . Coenzyme Q10 100 MG CPCR Take 1 capsule by mouth.       . furosemide (LASIX) 40 MG tablet Take 1 tablet (40 mg total) by mouth daily.  30 tablet  3    . gabapentin (NEURONTIN) 300 MG capsule 1 cap po qod  45 capsule  2  . glyBURIDE-metformin (GLUCOVANCE) 5-500 MG per tablet Take 2 tablets by mouth 2 (two) times daily.  360 tablet  1  . pregabalin (LYRICA) 75 MG capsule Take 150 mg by mouth 2 (two) times daily.      . vitamin E 1000 UNIT capsule Take 1,000 Units by mouth daily.        . Inositol Niacinate (NIACIN FLUSH FREE) 500 MG CAPS Take 1 capsule by mouth daily.        . Pyridoxine HCl (VITAMIN B-6) 500 MG tablet Take 500 mg by mouth daily.         Last reviewed on 04/16/2012  8:36 AM by Jeoffrey Massed, MD  PE: Blood pressure 160/81, pulse 89, height 5\' 3"  (1.6 m), weight 274 lb (124.286 kg). Wt unchanged since last f/u visit. Gen: Alert, well appearing, obese white female.  Patient is oriented to person, place, time, and situation. ENT: Eyes: no injection, icteris, swelling, or exudate.  EOMI, PERRLA. Nose: no drainage or turbinate edema/swelling.  No injection or focal lesion.  Mouth: lips without lesion/swelling.  Oral mucosa pink and  moist.  Dentition intact and without obvious caries or gingival swelling.  Oropharynx without erythema, exudate, or swelling.  Neck - No masses or thyromegaly or limitation in range of motion CV: RRR 2/6 systolic murmur, S1 and S2 fairly distinct, without diastolic murmur.  Lungs CTA bilat, nonlabored resps. Shoulders: bilat AC joint TTP, pain with arm abduction bilat.  Abduction is limited to 90 degrees bilat due to pain. No subacromial TTP.  Shoulder ER/IR are fine.  Flexion and extension at shoulder are fine.  UE strength 5/5/ bilat. UE DTRs trace at biceps and triceps bilat. Right greater troch region with mild TTP.  IMPRESSION AND PLAN:  DIAB W/NEURO MANIFESTS TYPE II/UNS NOT UNCNTRL Stable. Will check labs at next f/u 07/2012.  OBESITY, MORBID Unchanged wt. She simply will not push herself to exercise. Will keep encouraging her.  ELEVATED BP READING WITHOUT DX HYPERTENSION Pt still  averse to trying med at this time. Will try to talk her into this more at next visit--she is mentally tough in this respect, and I have to be tactful with trying to manage things like this and not get too aggressive for her or it will completely scare her off.  Heart murmur, systolic I hear no significant change. She does not stress herself enough to determine whether or not she may have a symptomatic/hemodynamically significant valvular problem. At this time she is ok, as am I, with following this with serial exams and watch for sx's.  Arthralgia of shoulder region Bilateral. Exam c/w more of an AC joint arthritis than anything glenohumeral or rotator cuff-like. Reassured.  Try ice/heat, encouraged ROM.  If worsens then will refer to PT, +/- ortho as appropriate.   Right greater troch bursitis: mild, no specific mgmt at this time.  Reassured pt that this was not a low back or intra-articular hip problem.  Avoid sleeping on this side for at least a week.  Flu vaccine IM today.  An After Visit Summary was printed and given to the patient.  FOLLOW UP: 4 mo

## 2012-04-18 DIAGNOSIS — M25519 Pain in unspecified shoulder: Secondary | ICD-10-CM | POA: Insufficient documentation

## 2012-04-18 NOTE — Assessment & Plan Note (Signed)
Pt still averse to trying med at this time. Will try to talk her into this more at next visit--she is mentally tough in this respect, and I have to be tactful with trying to manage things like this and not get too aggressive for her or it will completely scare her off.

## 2012-04-18 NOTE — Assessment & Plan Note (Signed)
Bilateral. Exam c/w more of an AC joint arthritis than anything glenohumeral or rotator cuff-like. Reassured.  Try ice/heat, encouraged ROM.  If worsens then will refer to PT, +/- ortho as appropriate.

## 2012-04-18 NOTE — Assessment & Plan Note (Signed)
Stable. Will check labs at next f/u 07/2012.

## 2012-04-18 NOTE — Assessment & Plan Note (Signed)
I hear no significant change. She does not stress herself enough to determine whether or not she may have a symptomatic/hemodynamically significant valvular problem. At this time she is ok, as am I, with following this with serial exams and watch for sx's.

## 2012-04-18 NOTE — Assessment & Plan Note (Signed)
Unchanged wt. She simply will not push herself to exercise. Will keep encouraging her.

## 2012-08-15 ENCOUNTER — Ambulatory Visit (INDEPENDENT_AMBULATORY_CARE_PROVIDER_SITE_OTHER): Payer: Medicare Other | Admitting: Family Medicine

## 2012-08-15 ENCOUNTER — Encounter: Payer: Self-pay | Admitting: Family Medicine

## 2012-08-15 VITALS — BP 154/84 | HR 84 | Temp 97.7°F | Resp 16 | Wt 263.5 lb

## 2012-08-15 DIAGNOSIS — E1142 Type 2 diabetes mellitus with diabetic polyneuropathy: Secondary | ICD-10-CM

## 2012-08-15 DIAGNOSIS — I872 Venous insufficiency (chronic) (peripheral): Secondary | ICD-10-CM

## 2012-08-15 DIAGNOSIS — I10 Essential (primary) hypertension: Secondary | ICD-10-CM

## 2012-08-15 DIAGNOSIS — E1149 Type 2 diabetes mellitus with other diabetic neurological complication: Secondary | ICD-10-CM

## 2012-08-15 LAB — BASIC METABOLIC PANEL
BUN: 17 mg/dL (ref 6–23)
Chloride: 102 mEq/L (ref 96–112)
Potassium: 4.8 mEq/L (ref 3.5–5.1)
Sodium: 137 mEq/L (ref 135–145)

## 2012-08-15 MED ORDER — GLYBURIDE-METFORMIN 5-500 MG PO TABS: 2.0000 | ORAL_TABLET | Freq: Two times a day (BID) | ORAL | Status: DC

## 2012-08-15 MED ORDER — LISINOPRIL 10 MG PO TABS: 10.0000 mg | ORAL_TABLET | Freq: Every day | ORAL | Status: DC

## 2012-08-15 NOTE — Assessment & Plan Note (Signed)
Time to start med, patient is agreeable. Start lisinopril 10mg  qd, continue home bp monitoring through her friend. F/u 1 mo, possibly repeat BUN/Cr/K at that time.

## 2012-08-15 NOTE — Assessment & Plan Note (Signed)
Still problematic, impeding regular walking/exercise. Encouraged her to increase her lasix to 40mg  DAILY instead of prn (she avg's qod dosing currently). She couldn't tolerate compression stockings.  Encouraged her to elevate legs and continue to minimize sodium intake. Check lytes/cr today.

## 2012-08-15 NOTE — Assessment & Plan Note (Signed)
Due for HbA1c today. Foot exam today c/w mild DPN.

## 2012-08-15 NOTE — Assessment & Plan Note (Signed)
She has lost 10 lbs in 70mo and I congratulated her for this, encouraged her to continue to increase exercise and eat prudent diet.

## 2012-08-15 NOTE — Progress Notes (Signed)
OFFICE NOTE  08/15/2012  CC:  Chief Complaint  Patient presents with  . Follow-up    4-mth. [DM; HTN; Obesity]     HPI: Patient is a 67 y.o. Caucasian female who is here for 4 mo f/u DM 2, obesity, painful DPN. Glucoses ok: avg 130 fasting and random, but admits the last few weeks they have been "a little worse".  No log book or better number recollection today. Compliant with meds most of the time but if glucose is 110 or so around bedtime she is afraid to take her hs dosing of glyb/metf. Still with very irritating burning/tingling in both LE's, esp bottoms of feet.  This has been somewhat alleviated in the past with use of lyrica but pt reports that lyrica consistently makes her hyper-emotional, "I cry at the drop of a hat".  She stops the med and within 2-3 days all of this goes away.  She takes neurontin 1 cap per day and this has helped a bit.  She denies any similar psych side effect from neurontin.  Her feet edema makes it hard for her to ambulate/exercise.  She takes lasix qod at the most.  A friend of hers checks her bp occasionally at home and it is consistently 150s over 80s.  Also c/o lateral hip and thigh pain on right when she sleeps on her side.  When she sleeps in recliner she does not have this. She denies pain in the area with wt bearing/ambulation.  Pertinent PMH:  Past Medical History  Diagnosis Date  . Diabetes mellitus     Type 2  . Obesity   . Osteoarthritis     low back, hips, knees  . Hypertriglyceridemia   . Transaminasemia mild 4/11    hep panel- neg  . Elevated blood pressure reading without diagnosis of hypertension    Past Surgical History  Procedure Laterality Date  . Total abdominal hysterectomy  1986  . Appendectomy  1986    MEDS:  Outpatient Prescriptions Prior to Visit  Medication Sig Dispense Refill  . Ascorbic Acid (VITAMIN C CR) 1500 MG TBCR Take 1 tablet by mouth daily.        Marland Kitchen aspirin 81 MG tablet Take 81 mg by mouth daily.         . Coenzyme Q10 100 MG CPCR Take 1 capsule by mouth.       . furosemide (LASIX) 40 MG tablet Take 1 tablet (40 mg total) by mouth daily.  30 tablet  3  . gabapentin (NEURONTIN) 300 MG capsule 1 cap po qod  45 capsule  2  . vitamin E 1000 UNIT capsule Take 1,000 Units by mouth daily.        Marland Kitchen glyBURIDE-metformin (GLUCOVANCE) 5-500 MG per tablet Take 2 tablets by mouth 2 (two) times daily.  360 tablet  1  . pregabalin (LYRICA) 75 MG capsule Take 150 mg by mouth 2 (two) times daily.      . Biotin 300 MCG TABS Take 1 tablet by mouth daily.        . Inositol Niacinate (NIACIN FLUSH FREE) 500 MG CAPS Take 1 capsule by mouth daily.        . Pyridoxine HCl (VITAMIN B-6) 500 MG tablet Take 500 mg by mouth daily.         No facility-administered medications prior to visit.    PE: Blood pressure 154/84, pulse 84, temperature 97.7 F (36.5 C), temperature source Oral, resp. rate 16, weight 263 lb 8  oz (119.523 kg), SpO2 93.00%. Gen: Alert, well appearing.  Patient is oriented to person, place, time, and situation. She gets tearful when talking about the lyrica and how it makes her feel. EXT: 2+ pitting/brawny edema in pretibial regions down into both feet.  Mild stasis derm rash pretibial. Feet: no skin breakdown.  Monofilament testing shows decreased sensation in plantar surfaces of toes/feet bilat.   LABS:  Lab Results  Component Value Date   HGBA1C 7.0* 12/15/2011     Chemistry      Component Value Date/Time   NA 142 07/21/2011 0949   K 4.6 07/21/2011 0949   CL 104 07/21/2011 0949   CO2 30 07/21/2011 0949   BUN 17 07/21/2011 0949   CREATININE 0.8 07/21/2011 0949   CREATININE 0.89 10/27/2010 0912      Component Value Date/Time   CALCIUM 9.4 07/21/2011 0949   ALKPHOS 67 07/21/2011 0949   AST 16 07/21/2011 0949   ALT 16 07/21/2011 0949   BILITOT 0.6 07/21/2011 0949      IMPRESSION AND PLAN:  DIAB W/NEURO MANIFESTS TYPE II/UNS NOT UNCNTRL Due for HbA1c today. Foot exam today c/w mild DPN.   HTN  (hypertension), benign Time to start med, patient is agreeable. Start lisinopril 10mg  qd, continue home bp monitoring through her friend. F/u 1 mo, possibly repeat BUN/Cr/K at that time.  Chronic venous insufficiency Still problematic, impeding regular walking/exercise. Encouraged her to increase her lasix to 40mg  DAILY instead of prn (she avg's qod dosing currently). She couldn't tolerate compression stockings.  Encouraged her to elevate legs and continue to minimize sodium intake. Check lytes/cr today.  Diabetic peripheral neuropathy Stop lyrica due to psych side effects. Titrate neurontin up to bid and then tid if needed.  OBESITY, MORBID She has lost 10 lbs in 57mo and I congratulated her for this, encouraged her to continue to increase exercise and eat prudent diet.   Right trochanteric bursitis: avoid lying on right side.  I meant to give pt stretches to do for this (handout) but forgot.  Will give her this at next f/u visit.  An After Visit Summary was printed and given to the patient.  FOLLOW UP:  1 mo f/u HTN

## 2012-08-15 NOTE — Assessment & Plan Note (Signed)
Stop lyrica due to psych side effects. Titrate neurontin up to bid and then tid if needed.

## 2012-08-16 ENCOUNTER — Other Ambulatory Visit: Payer: Self-pay | Admitting: Family Medicine

## 2012-08-16 MED ORDER — FUROSEMIDE 40 MG PO TABS: 40.0000 mg | ORAL_TABLET | Freq: Every day | ORAL | Status: DC

## 2012-08-16 NOTE — Telephone Encounter (Signed)
Refill request for lasix Last filled-10/27/10, #30 x3 Last seen-08/15/12 Follow up -09/14/12 Please advise refill?

## 2012-09-14 ENCOUNTER — Ambulatory Visit (INDEPENDENT_AMBULATORY_CARE_PROVIDER_SITE_OTHER): Payer: Medicare Other | Admitting: Family Medicine

## 2012-09-14 ENCOUNTER — Encounter: Payer: Self-pay | Admitting: Family Medicine

## 2012-09-14 VITALS — BP 130/80 | HR 92 | Temp 98.0°F | Ht 63.0 in | Wt 271.5 lb

## 2012-09-14 DIAGNOSIS — I872 Venous insufficiency (chronic) (peripheral): Secondary | ICD-10-CM

## 2012-09-14 DIAGNOSIS — I10 Essential (primary) hypertension: Secondary | ICD-10-CM

## 2012-09-14 DIAGNOSIS — E1149 Type 2 diabetes mellitus with other diabetic neurological complication: Secondary | ICD-10-CM

## 2012-09-14 LAB — BASIC METABOLIC PANEL WITH GFR
BUN: 16 mg/dL (ref 6–23)
CO2: 29 meq/L (ref 19–32)
Calcium: 9.4 mg/dL (ref 8.4–10.5)
Chloride: 103 meq/L (ref 96–112)
Creatinine, Ser: 0.8 mg/dL (ref 0.4–1.2)
GFR: 75.92 mL/min
Glucose, Bld: 129 mg/dL — ABNORMAL HIGH (ref 70–99)
Potassium: 4.5 meq/L (ref 3.5–5.1)
Sodium: 138 meq/L (ref 135–145)

## 2012-09-14 LAB — LIPID PANEL
Cholesterol: 162 mg/dL (ref 0–200)
HDL: 31.3 mg/dL — ABNORMAL LOW
LDL Cholesterol: 104 mg/dL — ABNORMAL HIGH (ref 0–99)
Total CHOL/HDL Ratio: 5
Triglycerides: 133 mg/dL (ref 0.0–149.0)
VLDL: 26.6 mg/dL (ref 0.0–40.0)

## 2012-09-14 MED ORDER — LISINOPRIL 10 MG PO TABS: 10.0000 mg | ORAL_TABLET | Freq: Every day | ORAL | Status: DC

## 2012-09-14 NOTE — Progress Notes (Signed)
OFFICE NOTE  09/14/2012  CC:  Chief Complaint  Patient presents with  . Follow-up    1 month     HPI: Patient is a 67 y.o. Caucasian female who is here for 1 mo f/u HTN and chronic venous insufficiency edema. Says leg swelling overall improved on a day to day basis since starting daily lasix dosing last month. Tolerating lisinopril w/out problem.  Home bp checks all 130s/70s-80 per pt report today.  ROS: no palpitations, no chest pain, no HAs, no cough, no abd pain.  Appetite good.  Pertinent PMH:  Past Medical History  Diagnosis Date  . Diabetes mellitus     Type 2  . Obesity   . Osteoarthritis     low back, hips, knees  . Hypertriglyceridemia   . Transaminasemia mild 4/11    hep panel- neg  . Elevated blood pressure reading without diagnosis of hypertension    Past surgical, social, and family history reviewed and no changes noted since last office visit.  MEDS:  Outpatient Prescriptions Prior to Visit  Medication Sig Dispense Refill  . Ascorbic Acid (VITAMIN C CR) 1500 MG TBCR Take 1 tablet by mouth daily.        Marland Kitchen aspirin 81 MG tablet Take 81 mg by mouth daily.        . Coenzyme Q10 100 MG CPCR Take 1 capsule by mouth.       . furosemide (LASIX) 40 MG tablet Take 1 tablet (40 mg total) by mouth daily.  30 tablet  6  . gabapentin (NEURONTIN) 300 MG capsule 1 cap po qod  45 capsule  2  . glyBURIDE-metformin (GLUCOVANCE) 5-500 MG per tablet Take 2 tablets by mouth 2 (two) times daily.  360 tablet  1  . lisinopril (PRINIVIL,ZESTRIL) 10 MG tablet Take 1 tablet (10 mg total) by mouth daily.  30 tablet  1  . vitamin E 1000 UNIT capsule Take 1,000 Units by mouth daily.         No facility-administered medications prior to visit.    PE: Blood pressure 130/80, pulse 92, temperature 98 F (36.7 C), temperature source Oral, height 5\' 3"  (1.6 m), weight 271 lb 8 oz (123.152 kg), SpO2 95.00%. Gen: Alert, well appearing.  Patient is oriented to person, place, time, and  situation. No further exam today.  IMPRESSION AND PLAN:  1) HTN, control good now.  Continue lisinopril, RFs done today. Recheck BMET today.  2) Hyperlipidemia, past hx: check FLP today.  3) LE venous insufficiency: stable/improved on daily lasix 40mg  qd.  Check cr/lytes today.  FOLLOW UP: 3 mo DM 2 f/u

## 2012-09-19 ENCOUNTER — Telehealth: Payer: Self-pay | Admitting: Family Medicine

## 2012-09-19 NOTE — Telephone Encounter (Signed)
While giving spouse requested lab information, pt reported she had already seen her results on [facility] Chart]/SLS

## 2012-09-26 ENCOUNTER — Encounter: Payer: Self-pay | Admitting: *Deleted

## 2012-10-09 ENCOUNTER — Telehealth: Payer: Self-pay | Admitting: Family Medicine

## 2012-10-09 NOTE — Telephone Encounter (Signed)
Patient Information:  Caller Name: Lanaya  Phone: [phone]  Patient: Bonnie Kane, Bonnie Kane  Gender: Female  DOB: 03/14/1946  Age: 67 Years  PCP: Earley Favor [facility])  Office Follow Up:  Does the office need to follow up with this patient?: Yes  Instructions For The Office: Please see pt's sxs and follow up with pt if any changes in med's need to be done or other recommendations.   Symptoms  Reason For Call & Symptoms: Approximately 6 weeks pt has had a dry hacking cough, no other symptoms, no drainage, afebrile.    Pt has been on lisinopril x 2 months.  Advised pt of need for appt within 3 days, pt requests if message could be sent to Dr regarding sxs and if she needs to change medications or do anything differently.  Reviewed Health History In EMR: Yes  Reviewed Medications In EMR: Yes  Reviewed Allergies In EMR: Yes  Reviewed Surgeries / Procedures: Yes  Date of Onset of Symptoms: 08/28/2012  Guideline(s) Used:  Cough  Disposition Per Guideline:   See Within 3 Days in Office  Reason For Disposition Reached:   Taking an ACE Inhibitor medication (e.g., benazepril/LOTENSIN, captopril/CAPOTEN, enalapril/VASOTEC, lisinopril/ZESTRIL)  Advice Given:  Call Back If:  Difficulty breathing  Fever lasts > 3 days  You become worse.  Patient Refused Recommendation:  Patient Refused Care Advice  Pt does not want to schedule appt at this time, requests for message to be sent to Dr regarding sxs.

## 2012-10-10 NOTE — Telephone Encounter (Signed)
Please advise 

## 2012-10-10 NOTE — Telephone Encounter (Signed)
Spoke with patient and she states she does not want to come in because she has a lot going on right now.  Please advise about cough.

## 2012-10-11 MED ORDER — IRBESARTAN 150 MG PO TABS: ORAL_TABLET | ORAL | Status: DC

## 2012-10-11 NOTE — Telephone Encounter (Signed)
OK--we'll assume her cough is a side effect of her lisinopril and we'll change bp med to generic avapro--I have already sent in 1 mo supply with 1 RF.  If she's doing well on the med, tolerating it, etc, then she should call and let us know when the med RF time comes and then I'll do a 90 day rx for it.--thx

## 2012-10-11 NOTE — Telephone Encounter (Signed)
Left detailed message on patients vm

## 2012-10-18 ENCOUNTER — Telehealth: Payer: Self-pay | Admitting: Family Medicine

## 2012-10-18 NOTE — Telephone Encounter (Signed)
Pt states that since stopping lisinopril she is no longer coughing.  She wants to know what she should take for her BP now.  Please advise.

## 2012-10-22 NOTE — Telephone Encounter (Signed)
Left detailed message for pt explaining provider instructions.  Told her to call back if she has any questions about avapro or if she needed her 90 day supply filled.

## 2012-10-22 NOTE — Telephone Encounter (Signed)
--  I have already sent in 1 mo supply with 1 RF. If she's doing well on the med, tolerating it, etc, then she should call and let us know when the med RF time comes and then I'll do a 90 day rx for it.--thx      I did the new med on 10/11/12 when I d/c'd the old med and Marcelino Duster documented that she left a voicemail message to pt with this information on it.  I guess pt did not recall this info.

## 2012-10-25 ENCOUNTER — Other Ambulatory Visit: Payer: Self-pay

## 2012-12-12 ENCOUNTER — Ambulatory Visit (INDEPENDENT_AMBULATORY_CARE_PROVIDER_SITE_OTHER): Payer: Medicare Other | Admitting: Family Medicine

## 2012-12-12 DIAGNOSIS — Z111 Encounter for screening for respiratory tuberculosis: Secondary | ICD-10-CM

## 2012-12-14 ENCOUNTER — Ambulatory Visit (INDEPENDENT_AMBULATORY_CARE_PROVIDER_SITE_OTHER): Payer: Medicare Other | Admitting: Family Medicine

## 2012-12-14 ENCOUNTER — Encounter: Payer: Self-pay | Admitting: Family Medicine

## 2012-12-14 VITALS — BP 130/76 | HR 76 | Temp 97.4°F | Resp 16 | Ht 63.0 in | Wt 251.0 lb

## 2012-12-14 DIAGNOSIS — IMO0001 Reserved for inherently not codable concepts without codable children: Secondary | ICD-10-CM

## 2012-12-14 DIAGNOSIS — M25519 Pain in unspecified shoulder: Secondary | ICD-10-CM

## 2012-12-14 DIAGNOSIS — I10 Essential (primary) hypertension: Secondary | ICD-10-CM

## 2012-12-14 DIAGNOSIS — G8929 Other chronic pain: Secondary | ICD-10-CM | POA: Insufficient documentation

## 2012-12-14 DIAGNOSIS — M542 Cervicalgia: Secondary | ICD-10-CM

## 2012-12-14 LAB — TB SKIN TEST: TB Skin Test: NEGATIVE

## 2012-12-14 NOTE — Progress Notes (Signed)
OFFICE NOTE  12/14/2012  CC:  Chief Complaint  Patient presents with  . Hypertension  . Diabetes  . Follow-up     HPI: Patient is a 67 y.o. Caucasian female who is here for routine f/u DM 2, HTN, and obesity.   Gluc's normal. Taking fish oil bid instead of the statin I recommended.  C/o chronic neck pain and stiffness, and last 2 mo or so has noted heaviness and pain in shoulders/upper arms, worse with lifting and with turning/bending of neck.  No real tingling or numbness in arms.  No pain at rest.  Takes no meds for this.  Had TB skin test placed her 2 d/a, needs this read and documented today.  Pertinent PMH:  Past Medical History  Diagnosis Date  . Diabetes mellitus     Type 2  . Obesity   . Osteoarthritis     low back, hips, knees  . Hypertriglyceridemia   . Transaminasemia mild 4/11    hep panel- neg  . Elevated blood pressure reading without diagnosis of hypertension    Past Surgical History  Procedure Laterality Date  . Total abdominal hysterectomy  1986  . Appendectomy  1986    MEDS:  Outpatient Prescriptions Prior to Visit  Medication Sig Dispense Refill  . Ascorbic Acid (VITAMIN C CR) 1500 MG TBCR Take 1 tablet by mouth daily.        Marland Kitchen aspirin 81 MG tablet Take 81 mg by mouth daily.        . Coenzyme Q10 100 MG CPCR Take 1 capsule by mouth.       . furosemide (LASIX) 40 MG tablet Take 1 tablet (40 mg total) by mouth daily.  30 tablet  6  . gabapentin (NEURONTIN) 300 MG capsule 1 cap po qod  45 capsule  2  . glyBURIDE-metformin (GLUCOVANCE) 5-500 MG per tablet Take 2 tablets by mouth 2 (two) times daily.  360 tablet  1  . irbesartan (AVAPRO) 150 MG tablet 1 tab by mouth every day for treatment of high blood pressure  30 tablet  1  . vitamin E 1000 UNIT capsule Take 1,000 Units by mouth daily.         No facility-administered medications prior to visit.    PE: Blood pressure 130/76, pulse 76, temperature 97.4 F (36.3 C), temperature source Temporal,  resp. rate 16, height 5\' 3"  (1.6 m), weight 251 lb (113.853 kg), SpO2 95.00%.  Gen: Alert, well appearing.  Patient is oriented to person, place, time, and situation. Neck: ROM moderately diminished in extension and lateral bending, mildly diminished in rotation.  Flexion is fine. Neck musculature tense, mildly tender, as are her upper back/trapezius regions. She has R shoulder >L shoulder TTP around hook of acromion and at [facility] joint.  Deltoids nontender. Pain with resisted abduction of arms and with resisted ER, R worse than left. Negative drop sign.  She can only abduct her arms to about 110 degrees--limited by pain.  Left forearm where TB skin test was placed: there is NO induration or tenderness.  She has approx 2 cm diameter oval of mild pinkish skin coloration where the test was placed.  IMPRESSION AND PLAN:  1) DM 2 (with DPN) and obesity: dieting well/good wt loss/good glucose numbers. Check HbA1c today.  Eye exam UTD now (no diab retpthy). Continue current meds.  2) HTN: good control.  Continue Avapro 150mg  qd.  3) Bilat shoulder arthralgias---likely some AC joint arthritis as well as some rotator cuff  tendonitis. Discussed judicious use of nsaids and she'll think about my recommendation of PT for this as well as for her neck.  4) Cervical spine arthralgia: likely some DDD + myofascial pain. NSAIDs + possible PT discussed.  5) TB skin test: negative  FOLLOW UP: 59mo

## 2012-12-24 ENCOUNTER — Other Ambulatory Visit: Payer: Self-pay | Admitting: Family Medicine

## 2013-01-19 ENCOUNTER — Other Ambulatory Visit: Payer: Self-pay | Admitting: Family Medicine

## 2013-02-21 ENCOUNTER — Other Ambulatory Visit: Payer: Self-pay

## 2013-02-23 ENCOUNTER — Other Ambulatory Visit: Payer: Self-pay | Admitting: Family Medicine

## 2013-02-25 NOTE — Telephone Encounter (Signed)
Patient requesting a refill on Gabapentin.  Patient last seen 12/14/12.  Last rx was sent 12/14/12 x 2 refills.  Please advise.

## 2013-03-08 ENCOUNTER — Encounter: Payer: Self-pay | Admitting: Family Medicine

## 2013-03-08 ENCOUNTER — Telehealth: Payer: Self-pay | Admitting: Family Medicine

## 2013-03-08 NOTE — Telephone Encounter (Signed)
On 02/25/13 I did 30d supply with 6 RFs.  Why is a new rx needed at this time?  I will be seeing her in the office for routine f/u every 6 months and can renew her rx as appropriate at that time. Let me know if there is anything else i can help with.-thx

## 2013-03-08 NOTE — Telephone Encounter (Signed)
Patient would like her BP medication refilled for one year.  Please advise.

## 2013-04-15 ENCOUNTER — Ambulatory Visit: Payer: Medicare Other | Admitting: Family Medicine

## 2013-04-22 ENCOUNTER — Encounter: Payer: Self-pay | Admitting: Family Medicine

## 2013-04-22 ENCOUNTER — Ambulatory Visit (INDEPENDENT_AMBULATORY_CARE_PROVIDER_SITE_OTHER): Payer: Medicare HMO | Admitting: Family Medicine

## 2013-04-22 VITALS — BP 146/82 | HR 69 | Temp 97.8°F | Resp 18 | Ht 63.0 in | Wt 257.0 lb

## 2013-04-22 DIAGNOSIS — E1149 Type 2 diabetes mellitus with other diabetic neurological complication: Secondary | ICD-10-CM

## 2013-04-22 DIAGNOSIS — E1142 Type 2 diabetes mellitus with diabetic polyneuropathy: Secondary | ICD-10-CM

## 2013-04-22 DIAGNOSIS — E119 Type 2 diabetes mellitus without complications: Secondary | ICD-10-CM

## 2013-04-22 DIAGNOSIS — I10 Essential (primary) hypertension: Secondary | ICD-10-CM

## 2013-04-22 DIAGNOSIS — E66813 Obesity, class 3: Secondary | ICD-10-CM

## 2013-04-22 LAB — BASIC METABOLIC PANEL
BUN: 17 mg/dL (ref 6–23)
CHLORIDE: 103 meq/L (ref 96–112)
CO2: 28 mEq/L (ref 19–32)
Calcium: 9.8 mg/dL (ref 8.4–10.5)
Creatinine, Ser: 0.8 mg/dL (ref 0.4–1.2)
GFR: 81.64 mL/min (ref >60.00)
Glucose, Bld: 97 mg/dL (ref 70–99)
POTASSIUM: 4.5 meq/L (ref 3.5–5.1)
Sodium: 140 mEq/L (ref 135–145)

## 2013-04-22 LAB — LIPID PANEL
CHOLESTEROL: 174 mg/dL (ref 0–200)
HDL: 35.6 mg/dL — ABNORMAL LOW (ref >39.00)
LDL Cholesterol: 110 mg/dL — ABNORMAL HIGH (ref 0–99)
TRIGLYCERIDES: 141 mg/dL (ref 0.0–149.0)
Total CHOL/HDL Ratio: 5
VLDL: 28.2 mg/dL (ref 0.0–40.0)

## 2013-04-22 LAB — HEMOGLOBIN A1C: Hgb A1c MFr Bld: 6.6 % — ABNORMAL HIGH (ref 4.6–6.5)

## 2013-04-22 LAB — MICROALBUMIN / CREATININE URINE RATIO
CREATININE, U: 91.5 mg/dL
MICROALB/CREAT RATIO: 0.5 mg/g (ref 0.0–30.0)
Microalb, Ur: 0.5 mg/dL (ref 0.0–1.9)

## 2013-04-22 NOTE — Progress Notes (Signed)
OFFICE NOTE  04/22/2013  CC:  Chief Complaint  Patient presents with  . Follow-up     HPI: Patient is a 68 y.o. Caucasian female who is here for 4 mo f/u DM 2, HTN, obesity.   Here for fasting blood work.   Occ gluc check near normal.  No home bp checks to report. Compliant with meds, doing a bit better with diet.  Walks a little more now but still not much. Aches and pains doing well on alleve 2 tabs once a day most days.   Pertinent PMH:  Past Medical History  Diagnosis Date  . Diabetes mellitus     Type 2  . Obesity   . Osteoarthritis     low back, hips, knees  . Hypertriglyceridemia   . Transaminasemia mild 4/11    hep panel- neg  . HTN (hypertension), benign    Past surgical, social, and family history reviewed and no changes noted since last office visit.  MEDS:  Outpatient Prescriptions Prior to Visit  Medication Sig Dispense Refill  . Ascorbic Acid (VITAMIN C CR) 1500 MG TBCR Take 1 tablet by mouth daily.        Marland Kitchen aspirin 81 MG tablet Take 81 mg by mouth daily.        . Coenzyme Q10 100 MG CPCR Take 1 capsule by mouth.       . furosemide (LASIX) 40 MG tablet Take 1 tablet (40 mg total) by mouth daily.  30 tablet  6  . gabapentin (NEURONTIN) 300 MG capsule TAKE 1 CAPSULE BY MOUTH 3TIMES A DAY  90 capsule  3  . glyBURIDE-metformin (GLUCOVANCE) 5-500 MG per tablet TAKE 2 TABLETS BY MOUTH 2TIMES DAILY.  360 tablet  1  . irbesartan (AVAPRO) 150 MG tablet TAKE 1 TABLET BY MOUTH DAILY FOR TREATMENT OF HIGH BLOOD PRESSURE  30 tablet  6  . vitamin E 1000 UNIT capsule Take 1,000 Units by mouth daily.         No facility-administered medications prior to visit.    PE: Blood pressure 146/82, pulse 69, temperature 97.8 F (36.6 C), temperature source Temporal, resp. rate 18, height 5\' 3"  (1.6 m), weight 257 lb (116.574 kg), SpO2 99.00%. Gen: Alert, well appearing.  Patient is oriented to person, place, time, and situation. No further exam today.  IMPRESSION AND  PLAN:  1) DM 2, non insulin-requiring. The current medical regimen is effective;  continue present plan and medications. HbA1c today.    2) HTN; The current medical regimen is effective;  continue present plan and medications. Cr/Lytes today.  3) Obesity; reinforced importance of TLC.  Encouraged pt in her steps in the right direction.  4) DPN; doing well on neurontin.  An After Visit Summary was printed and given to the patient.  FOLLOW UP: 42mo

## 2013-04-22 NOTE — Progress Notes (Signed)
Pre visit review using our clinic review tool, if applicable. No additional management support is needed unless otherwise documented below in the visit note. 

## 2013-05-10 ENCOUNTER — Telehealth: Payer: Self-pay | Admitting: Family Medicine

## 2013-05-10 NOTE — Telephone Encounter (Signed)
Patients diabetic testing supplys require prior-auth with her new insurance, the number to call is [phone], Patient uses  [place]

## 2013-06-06 NOTE — Telephone Encounter (Signed)
I tried to call [facility]

## 2013-06-07 NOTE — Telephone Encounter (Signed)
Spoke with pt, she states she was approved for her diabetes testing supplies. She received a letter in the mail.

## 2013-07-15 ENCOUNTER — Other Ambulatory Visit: Payer: Self-pay | Admitting: Family Medicine

## 2013-08-13 ENCOUNTER — Other Ambulatory Visit: Payer: Self-pay

## 2013-08-13 MED ORDER — GABAPENTIN 300 MG PO CAPS: ORAL_CAPSULE | ORAL | Status: DC

## 2013-08-13 MED ORDER — IRBESARTAN 150 MG PO TABS: ORAL_TABLET | ORAL | Status: DC

## 2013-08-13 MED ORDER — GLYBURIDE-METFORMIN 5-500 MG PO TABS: ORAL_TABLET | ORAL | Status: DC

## 2013-08-20 ENCOUNTER — Ambulatory Visit: Payer: Medicare HMO | Admitting: Family Medicine

## 2013-08-28 ENCOUNTER — Telehealth: Payer: Self-pay | Admitting: Family Medicine

## 2013-08-28 ENCOUNTER — Encounter: Payer: Self-pay | Admitting: Family Medicine

## 2013-08-28 ENCOUNTER — Ambulatory Visit (INDEPENDENT_AMBULATORY_CARE_PROVIDER_SITE_OTHER): Payer: Medicare HMO | Admitting: Family Medicine

## 2013-08-28 VITALS — BP 170/83 | HR 76 | Temp 97.4°F | Ht 63.0 in | Wt 263.0 lb

## 2013-08-28 DIAGNOSIS — I10 Essential (primary) hypertension: Secondary | ICD-10-CM

## 2013-08-28 DIAGNOSIS — E1142 Type 2 diabetes mellitus with diabetic polyneuropathy: Secondary | ICD-10-CM

## 2013-08-28 DIAGNOSIS — I872 Venous insufficiency (chronic) (peripheral): Secondary | ICD-10-CM

## 2013-08-28 DIAGNOSIS — E1149 Type 2 diabetes mellitus with other diabetic neurological complication: Secondary | ICD-10-CM

## 2013-08-28 LAB — HEMOGLOBIN A1C: HEMOGLOBIN A1C: 6.4 % (ref 4.6–6.5)

## 2013-08-28 NOTE — Patient Instructions (Signed)
Gradually increase your gabapentin by one 300mg  tab per week to help with your burning in feet. Build up to a dose of TWO 300mg  tabs three times a day by the time I see you next in 4 mo.

## 2013-08-28 NOTE — Progress Notes (Signed)
OFFICE NOTE  08/28/2013  CC:  Chief Complaint  Patient presents with  . Follow-up    4 month     HPI: Patient is a 68 y.o. Caucasian female who is here for 4 mo f/u DM and HTN. Fasting glucs 70s-110, hs gluc usually 130s. Not always requiring 2 tabs bid of glucovance.  Diet seems to be improving, still struggling to exercise much due to feet pain.  Home bps normal.  Still with quite a bit burning in feet,, also numbness:  takes 300mg  neurontin tid. Says she has fungus under one toenail, wants oral med.  Of note, pt reports being told by a family member that several family members have "HNPP", which she says she can't recall the name of but says it is something similar to MS.  She says she was told the only way to diagnose it is by EMG and she is unwilling to go through this test.    She has been told there is no treatment for the dz.   Pertinent PMH:  Past medical, surgical, social, and family history reviewed and no changes are noted since last office visit.  MEDS:  Outpatient Prescriptions Prior to Visit  Medication Sig Dispense Refill  . Ascorbic Acid (VITAMIN C CR) 1500 MG TBCR Take 1 tablet by mouth daily.        Marland Kitchen aspirin 81 MG tablet Take 81 mg by mouth daily.        . Coenzyme Q10 100 MG CPCR Take 1 capsule by mouth.       . furosemide (LASIX) 40 MG tablet Take 1 tablet (40 mg total) by mouth daily.  30 tablet  6  . gabapentin (NEURONTIN) 300 MG capsule TAKE 1 CAPSULE BY MOUTH 3TIMES A DAY  90 capsule  0  . glyBURIDE-metformin (GLUCOVANCE) 5-500 MG per tablet TAKE 2 TABLETS BY MOUTH 2TIMES DAILY.  360 tablet  0  . irbesartan (AVAPRO) 150 MG tablet TAKE 1 TABLET BY MOUTH DAILY FOR TREATMENT OF HIGH BLOOD PRESSURE  30 tablet  0  . vitamin E 1000 UNIT capsule Take 1,000 Units by mouth daily.         No facility-administered medications prior to visit.    PE: Blood pressure 170/83, pulse 76, temperature 97.4 F (36.3 C), temperature source Temporal, height 5\' 3"  (1.6  m), weight 263 lb (119.296 kg), SpO2 94.00%. Gen: Alert, well appearing, morbidly obese-appearing.  Patient is oriented to person, place, time, and situation. Feet: puffy, w/out much pitting edema at all, violaceous hue except for toes.  Some thickened toenails.  Mild decreased sensation in left heal with monofilament testing.  No calluses, no ulcerations.     IMPRESSION AND PLAN:  DIAB W/NEURO MANIFESTS TYPE II/UNS NOT UNCNTRL Stable.  Check HbA1c today.  Diabetic peripheral neuropathy Poor control.  Needs to up-titrate gabapentin: Gradually increase your gabapentin by one 300mg  tab per week to help with your burning in feet. Build up to a dose of TWO 300mg  tabs three times a day by the time I see you next in 4 mo.   HTN (hypertension), benign The current medical regimen is effective;  continue present plan and medications.   OBESITY, MORBID Reviewed prudent/diabetic diet, try to increase exercise slowly ---hopefully her DPN sx's will be less of an impairment in the near future.  FH of neurologic dz: ? "HNPP"--will try to research this.  An After Visit Summary was printed and given to the patient.  FOLLOW UP: 28mo

## 2013-08-28 NOTE — Telephone Encounter (Signed)
Relevant patient education assigned to patient using Emmi. ° °

## 2013-08-28 NOTE — Progress Notes (Signed)
Pre visit review using our clinic review tool, if applicable. No additional management support is needed unless otherwise documented below in the visit note. 

## 2013-09-07 NOTE — Assessment & Plan Note (Addendum)
Poor control.  Needs to up-titrate gabapentin: Gradually increase your gabapentin by one 300mg  tab per week to help with your burning in feet. Build up to a dose of TWO 300mg  tabs three times a day by the time I see you next in 4 mo.

## 2013-09-07 NOTE — Assessment & Plan Note (Signed)
Reviewed prudent/diabetic diet, try to increase exercise slowly ---hopefully her DPN sx's will be less of an impairment in the near future.

## 2013-09-07 NOTE — Assessment & Plan Note (Signed)
Stable.  Check HbA1c today.

## 2013-09-07 NOTE — Assessment & Plan Note (Signed)
The current medical regimen is effective;  continue present plan and medications.  

## 2013-09-13 ENCOUNTER — Other Ambulatory Visit: Payer: Self-pay

## 2013-09-13 MED ORDER — IRBESARTAN 150 MG PO TABS: ORAL_TABLET | ORAL | Status: DC

## 2013-09-27 ENCOUNTER — Telehealth: Payer: Self-pay | Admitting: Family Medicine

## 2013-09-27 MED ORDER — IRBESARTAN 150 MG PO TABS: ORAL_TABLET | ORAL | Status: DC

## 2013-09-27 NOTE — Telephone Encounter (Signed)
Patient is out of her irbesartan and needs a refill sent to [facility], patient no longer lives in [place] so she will no longer be using [facility] if that can be removed from her chart

## 2013-09-27 NOTE — Telephone Encounter (Signed)
Rx sent into [facility] per patient request.  [facility] removed from pt demographics.

## 2013-10-31 ENCOUNTER — Telehealth: Payer: Self-pay | Admitting: Family Medicine

## 2013-10-31 MED ORDER — GLYBURIDE-METFORMIN 5-500 MG PO TABS: ORAL_TABLET | ORAL | Status: DC

## 2013-10-31 NOTE — Telephone Encounter (Signed)
Rx sent to [facility].

## 2013-12-16 ENCOUNTER — Other Ambulatory Visit: Payer: Self-pay | Admitting: Family Medicine

## 2013-12-16 NOTE — Telephone Encounter (Signed)
Last visit it was our plan for her to increase her gabapentin dosing gradually to a dose of TWO of the 300 mg tabs tid.  Did she do this?  If not, how many tabs a day is she taking.  I need to know this so I can put the correct instructions on sig, dispense the right amount of pills, etc. -thx

## 2013-12-16 NOTE — Telephone Encounter (Signed)
Spoke with pt, she is taking two 300 mg TID.

## 2013-12-16 NOTE — Telephone Encounter (Signed)
Which pharmacy?  [facility] her [facility] in MEDS section, but an alternate one listed is [facility].  Also, does she want 30d or 90d supply?-thx

## 2013-12-16 NOTE — Telephone Encounter (Signed)
Pt requesting rf of gabapentin.  Last OV was 08/28/13.  Last Rx was 08/13/13 no refills.  Please advise.

## 2013-12-17 MED ORDER — GABAPENTIN 300 MG PO CAPS: ORAL_CAPSULE | ORAL | Status: DC

## 2013-12-17 NOTE — Telephone Encounter (Signed)
[place] and 90 day supply.

## 2013-12-17 NOTE — Telephone Encounter (Signed)
Ok, rx sent as per pt request.-thx

## 2014-02-26 ENCOUNTER — Ambulatory Visit (INDEPENDENT_AMBULATORY_CARE_PROVIDER_SITE_OTHER): Payer: Medicare HMO | Admitting: Family Medicine

## 2014-02-26 ENCOUNTER — Encounter: Payer: Self-pay | Admitting: Family Medicine

## 2014-02-26 VITALS — BP 160/90 | HR 68 | Temp 98.4°F | Ht 63.0 in | Wt 267.0 lb

## 2014-02-26 DIAGNOSIS — E1140 Type 2 diabetes mellitus with diabetic neuropathy, unspecified: Secondary | ICD-10-CM

## 2014-02-26 DIAGNOSIS — I10 Essential (primary) hypertension: Secondary | ICD-10-CM

## 2014-02-26 DIAGNOSIS — Z23 Encounter for immunization: Secondary | ICD-10-CM

## 2014-02-26 LAB — HEMOGLOBIN A1C: HEMOGLOBIN A1C: 6.1 % (ref 4.6–6.5)

## 2014-02-26 NOTE — Progress Notes (Signed)
OFFICE NOTE  02/26/2014  CC:  Chief Complaint  Patient presents with  . Follow-up     HPI: Patient is a 68 y.o. Caucasian female who is here for 4 mo f/u DM 2, HTN, obesity, DPN. Glucoses excellent: 90s fasting.  New diet: "whole 30" in last 3 months.  Taking glucovance only once every few weeks. Home bp's 140s/60s.    Feet: "much better", takes 2 of the 300mg  tabs 2-3 times per day, says she is walking better.    Pertinent PMH:  Past medical, surgical, social, and family history reviewed and no changes are noted since last office visit.  MEDS:  Outpatient Prescriptions Prior to Visit  Medication Sig Dispense Refill  . Ascorbic Acid (VITAMIN C CR) 1500 MG TBCR Take 1 tablet by mouth daily.      Marland Kitchen aspirin 81 MG tablet Take 81 mg by mouth daily.      . Coenzyme Q10 100 MG CPCR Take 1 capsule by mouth.     . furosemide (LASIX) 40 MG tablet Take 1 tablet (40 mg total) by mouth daily. 30 tablet 6  . gabapentin (NEURONTIN) 300 MG capsule TAKE 2 CAPSULES BY MOUTH 3 TIMES A DAY 540 capsule 4  . glyBURIDE-metformin (GLUCOVANCE) 5-500 MG per tablet TAKE 2 TABLETS BY MOUTH 2TIMES DAILY. 360 tablet 1  . irbesartan (AVAPRO) 150 MG tablet TAKE 1 TABLET BY MOUTH DAILY FOR TREATMENT OF HIGH BLOOD PRESSURE 90 tablet 1  . vitamin E 1000 UNIT capsule Take 1,000 Units by mouth daily.       No facility-administered medications prior to visit.    PE: Blood pressure 160/90, pulse 68, temperature 98.4 F (36.9 C), temperature source Temporal, height 5\' 3"  (1.6 m), weight 267 lb (121.11 kg), SpO2 96 %. Gen: Alert, well appearing.  Patient is oriented to person, place, time, and situation. No further exam today.  IMPRESSION AND PLAN:  1) DM 2, good control w/out med per home measurments. Cont good diet, activity. Check A1c today.  2) HTN: good control as per home bp monitoring.  3) DPN: much improved on gabapentin. Continue this med at current dosing.  4) Morbid obesity: doing great with  diet and increasing activity.  Flu vaccine IM today.  FOLLOW UP: 21mo

## 2014-02-26 NOTE — Progress Notes (Signed)
Pre visit review using our clinic review tool, if applicable. No additional management support is needed unless otherwise documented below in the visit note. 

## 2014-03-24 ENCOUNTER — Other Ambulatory Visit: Payer: Self-pay | Admitting: Family Medicine

## 2014-03-28 ENCOUNTER — Telehealth: Payer: Self-pay

## 2014-03-31 ENCOUNTER — Other Ambulatory Visit: Payer: Self-pay | Admitting: Family Medicine

## 2014-03-31 MED ORDER — GLYBURIDE-METFORMIN 5-500 MG PO TABS: ORAL_TABLET | ORAL | Status: DC

## 2014-08-27 ENCOUNTER — Encounter: Payer: Self-pay | Admitting: Family Medicine

## 2014-08-27 ENCOUNTER — Other Ambulatory Visit: Payer: Self-pay | Admitting: Family Medicine

## 2014-08-27 ENCOUNTER — Ambulatory Visit (INDEPENDENT_AMBULATORY_CARE_PROVIDER_SITE_OTHER): Payer: Medicare PPO | Admitting: Family Medicine

## 2014-08-27 VITALS — BP 160/84 | HR 65 | Temp 97.5°F | Resp 16 | Wt 268.0 lb

## 2014-08-27 DIAGNOSIS — I10 Essential (primary) hypertension: Secondary | ICD-10-CM | POA: Diagnosis not present

## 2014-08-27 DIAGNOSIS — E785 Hyperlipidemia, unspecified: Secondary | ICD-10-CM | POA: Diagnosis not present

## 2014-08-27 DIAGNOSIS — E118 Type 2 diabetes mellitus with unspecified complications: Secondary | ICD-10-CM | POA: Diagnosis not present

## 2014-08-27 LAB — LIPID PANEL
CHOL/HDL RATIO: 4
Cholesterol: 150 mg/dL (ref 0–200)
HDL: 37.4 mg/dL — AB (ref >39.00)
LDL CALC: 84 mg/dL (ref 0–99)
NONHDL: 112.6
TRIGLYCERIDES: 145 mg/dL (ref 0.0–149.0)
VLDL: 29 mg/dL (ref 0.0–40.0)

## 2014-08-27 LAB — COMPREHENSIVE METABOLIC PANEL
ALT: 11 U/L (ref 0–35)
AST: 14 U/L (ref 0–37)
Albumin: 3.8 g/dL (ref 3.5–5.2)
Alkaline Phosphatase: 83 U/L (ref 39–117)
BILIRUBIN TOTAL: 0.6 mg/dL (ref 0.2–1.2)
BUN: 15 mg/dL (ref 6–23)
CHLORIDE: 102 meq/L (ref 96–112)
CO2: 29 meq/L (ref 19–32)
Calcium: 9.7 mg/dL (ref 8.4–10.5)
Creatinine, Ser: 0.68 mg/dL (ref 0.40–1.20)
GFR: 91.05 mL/min (ref >60.00)
Glucose, Bld: 97 mg/dL (ref 70–99)
Potassium: 4.3 mEq/L (ref 3.5–5.1)
SODIUM: 139 meq/L (ref 135–145)
TOTAL PROTEIN: 7.6 g/dL (ref 6.0–8.3)

## 2014-08-27 LAB — MICROALBUMIN / CREATININE URINE RATIO
CREATININE, U: 84 mg/dL
MICROALB/CREAT RATIO: 1.3 mg/g (ref 0.0–30.0)
Microalb, Ur: 1.1 mg/dL (ref 0.0–1.9)

## 2014-08-27 LAB — HEMOGLOBIN A1C: Hgb A1c MFr Bld: 6.1 % (ref 4.6–6.5)

## 2014-08-27 MED ORDER — METFORMIN HCL 500 MG PO TABS: 500.0000 mg | ORAL_TABLET | Freq: Two times a day (BID) | ORAL | Status: DC

## 2014-08-27 NOTE — Progress Notes (Signed)
OFFICE NOTE  08/27/2014  CC:  Chief Complaint  Patient presents with  . Follow-up     HPI: Patient is a 69 y.o. Caucasian female who is here for 6 mo f/u DM 2, HTN, DPN, and morbid obesity. Says she feels good.  Walking more. She was doing well on neurontin but then started reading about potential side effects so she stopped it: she felt like it was making her depressed. Soaking feet, toes not burning.  Occ alleve use helps.  BP: uses wrist cuff daily: 132-143 over 60s.   Glucoses: fastings 90-115, hs 130 avg.  Takes glucovance about once a week. Diet is excellent, she is beginning to ramp up her walking regimen, esp since she's going to get diabetic shoes today.  Left knee hurting for the last 10d, no injury recalled, hurts with wt bearing only.  It swelled up some, felt hot one day, no redness.  Feels like it is improving the last few days.  Pertinent PMH:  Past medical, surgical, social, and family history reviewed and no changes are noted since last office visit. +Hx of osteoarthritis in L spine, hips, knees  MEDS:  Outpatient Prescriptions Prior to Visit  Medication Sig Dispense Refill  . Ascorbic Acid (VITAMIN C CR) 1500 MG TBCR Take 1 tablet by mouth daily.      Marland Kitchen aspirin 81 MG tablet Take 81 mg by mouth daily.      . Coenzyme Q10 100 MG CPCR Take 1 capsule by mouth.     . furosemide (LASIX) 40 MG tablet Take 1 tablet (40 mg total) by mouth daily. 30 tablet 6  . glyBURIDE-metformin (GLUCOVANCE) 5-500 MG per tablet TAKE 2 TABLETS BY MOUTH 2TIMES DAILY. 360 tablet 1  . irbesartan (AVAPRO) 150 MG tablet TAKE 1 TABLET DAILY FOR TREATMENT OF HIGH BLOOD PRESSURE 90 tablet 1  . vitamin E 1000 UNIT capsule Take 1,000 Units by mouth daily.      Marland Kitchen gabapentin (NEURONTIN) 300 MG capsule TAKE 2 CAPSULES BY MOUTH 3 TIMES A DAY (Patient not taking: Reported on 08/27/2014) 540 capsule 4   No facility-administered medications prior to visit.    PE: Blood pressure 160/84, pulse 65,  temperature 97.5 F (36.4 C), temperature source Oral, resp. rate 16, weight 268 lb (121.564 kg), SpO2 96 %. Gen: Alert, well appearing.  Patient is oriented to person, place, time, and situation. Knees: no obvious effusion, no warmth or erythema.  ROM is intact.  No instability, neg patellar grind.  Mild medial peripatellar tenderness.  No joint line tenderness. Doughy-type edema in both LL's in ankles and feet, only 1+ pitting edema in these areas, with hyperpigmentation of skin in ankles and feet.  LABS:  Lab Results  Component Value Date   HGBA1C 6.1 02/26/2014     Chemistry      Component Value Date/Time   NA 140 04/22/2013 1101   K 4.5 04/22/2013 1101   CL 103 04/22/2013 1101   CO2 28 04/22/2013 1101   BUN 17 04/22/2013 1101   CREATININE 0.8 04/22/2013 1101   CREATININE 0.89 10/27/2010 0912      Component Value Date/Time   CALCIUM 9.8 04/22/2013 1101   ALKPHOS 67 07/21/2011 0949   AST 16 07/21/2011 0949   ALT 16 07/21/2011 0949   BILITOT 0.6 07/21/2011 0949     Lab Results  Component Value Date   CHOL 174 04/22/2013   HDL 35.60* 04/22/2013   LDLCALC 110* 04/22/2013   TRIG 141.0 04/22/2013  CHOLHDL 5 04/22/2013    IMPRESSION AND PLAN:  1) DM 2, control is good. A1c and urine microalb/cr today. If A1c still excellent will likely change med to metformin and take daily instead of the way she is taking her glucovance at this time. Overdue for diab retpthy eye exam: reminded pt of need for this annually, encouraged her to find an optometrist or ophthalmologist in her new hometown in [place]. Her DPN pain in feet seems to be doing fine even since her self d/c of gabapentin.  2) HTN; control good per home monitoring. Continue avapro 150mg  qd. Check BMET today.  3) Hx of mild hyperlipidemia; check FLP today. She has been resistant to the idea of taking a statin in the past due to her husband's bad side effects from this med.  4) L knee pain recently: suspect  osteoarthritis flare.  Resolving. Knee exam normal today.  5) Prev health care: She refused prevnar 13 today.  She has repeatedly refused breast and colon cancer screening.  6) Morbid obesity: working hard on TLC and she feels good about her efforts and results at this point. Encouraged her to keep up the hard work.  An After Visit Summary was printed and given to the patient.  FOLLOW UP: 6 mo

## 2014-08-27 NOTE — Progress Notes (Signed)
Pre visit review using our clinic review tool, if applicable. No additional management support is needed unless otherwise documented below in the visit note. 

## 2014-09-01 ENCOUNTER — Telehealth: Payer: Self-pay

## 2014-09-01 ENCOUNTER — Telehealth: Payer: Self-pay | Admitting: *Deleted

## 2014-09-01 NOTE — Telephone Encounter (Signed)
Signed.

## 2014-09-01 NOTE — Telephone Encounter (Signed)
Patient lives in [place] and is going to look to see whats around her to be able to get screening done. She will CB but she does want a MMG scheduled.

## 2014-09-01 NOTE — Telephone Encounter (Signed)
I will document mammogram info

## 2014-09-01 NOTE — Telephone Encounter (Signed)
Patient called back & said she was having her mammo done at [facility].

## 2014-09-01 NOTE — Telephone Encounter (Signed)
Pt called requesting a new Rx be faxed to [facility] for her glucose meter, test strips and lancets. She states that they no longer carry the glucose meter she is using. Spoke to [employer] with [facility] and he stated that they will cover the [employer] with test strips and lancets. She states that she checks her blood sugar twice a day. Rx printed and waiting for providers signature. Will fax to [phone] once singed.

## 2014-10-07 ENCOUNTER — Telehealth: Payer: Self-pay | Admitting: *Deleted

## 2014-10-07 NOTE — Telephone Encounter (Signed)
Pt LMOM stating that she wants a shot for her knees and she wants to go back to the other medication for her DM. I spoke to pt and she stated that she is having pain in both of her knees and would like to discuss steroid injections. She also stated that she does not feel the metformin is working as well as the glyburide and she would like to go back to glyburide. She stated that glyburide will need a PA for insurance to pay for it. I advised her the Dr. Anitra Lauth was out of the office this week and would be back next week. She voiced understanding.

## 2014-10-13 MED ORDER — GLYBURIDE-METFORMIN 5-500 MG PO TABS: ORAL_TABLET | ORAL | Status: DC

## 2014-10-13 NOTE — Telephone Encounter (Signed)
Fine. Stop metformin. Pls eRx generic glucovance 5/500 (this is what she was on prior to my switching her to metformin), 2 tabs po bid, #360, RF x 3. Tell her to make o/v with me for knee injections.-thx

## 2014-10-13 NOTE — Telephone Encounter (Signed)
Pt advised and voiced understanding. Pt stated that she only needs to take 1-2 of the glucovance daily. Per Dr. Anitra Lauth okay to send Rx as requested.

## 2014-10-21 ENCOUNTER — Telehealth: Payer: Self-pay | Admitting: Family Medicine

## 2014-10-21 NOTE — Telephone Encounter (Signed)
Pt is requesting the reason for her next visit to decide if she wants to come to the office or not.  Please advise.

## 2014-10-21 NOTE — Telephone Encounter (Signed)
Noted  

## 2014-10-21 NOTE — Telephone Encounter (Signed)
Spoke to pt and she stated that she has to dirve 3 hours and if she is going to need xray of her knees then she would like to have them done there before she drives down. I advised pt that its best to be evaluated first and that there is a walking imaging clinics we can send her to after her apt if needed be. Pt voiced understanding and was in agreement.

## 2014-10-23 ENCOUNTER — Ambulatory Visit (HOSPITAL_BASED_OUTPATIENT_CLINIC_OR_DEPARTMENT_OTHER)
Admission: RE | Admit: 2014-10-23 | Discharge: 2014-10-23 | Disposition: A | Discharge status: Home / Self Care | Payer: Medicare PPO | Source: Ambulatory Visit | Attending: Family Medicine | Admitting: Family Medicine

## 2014-10-23 ENCOUNTER — Ambulatory Visit (INDEPENDENT_AMBULATORY_CARE_PROVIDER_SITE_OTHER): Payer: Medicare PPO | Admitting: Family Medicine

## 2014-10-23 ENCOUNTER — Encounter: Payer: Self-pay | Admitting: Family Medicine

## 2014-10-23 VITALS — BP 168/84 | HR 76 | Temp 97.6°F | Resp 16 | Ht 63.0 in | Wt 266.0 lb

## 2014-10-23 DIAGNOSIS — M25562 Pain in left knee: Secondary | ICD-10-CM

## 2014-10-23 DIAGNOSIS — M25561 Pain in right knee: Secondary | ICD-10-CM | POA: Insufficient documentation

## 2014-10-23 DIAGNOSIS — M150 Primary generalized (osteo)arthritis: Secondary | ICD-10-CM | POA: Diagnosis not present

## 2014-10-23 DIAGNOSIS — I739 Peripheral vascular disease, unspecified: Secondary | ICD-10-CM | POA: Insufficient documentation

## 2014-10-23 DIAGNOSIS — M1990 Unspecified osteoarthritis, unspecified site: Secondary | ICD-10-CM | POA: Insufficient documentation

## 2014-10-23 DIAGNOSIS — M179 Osteoarthritis of knee, unspecified: Secondary | ICD-10-CM | POA: Diagnosis not present

## 2014-10-23 DIAGNOSIS — M159 Polyosteoarthritis, unspecified: Secondary | ICD-10-CM

## 2014-10-23 MED ORDER — METHYLPREDNISOLONE ACETATE 40 MG/ML IJ SUSP
40.0000 mg | Freq: Once | INTRAMUSCULAR | Status: AC
  Administered 2014-10-23: 40 mg via INTRA_ARTICULAR

## 2014-10-23 MED ORDER — LIDOCAINE HCL 1 % IJ SOLN
2.0000 mL | Freq: Once | INTRAMUSCULAR | Status: AC
  Administered 2014-10-23: 2 mL

## 2014-10-23 NOTE — Addendum Note (Signed)
Addended by: Lanae Crumbly on: 10/23/2014 09:55 AM   Modules accepted: Orders

## 2014-10-23 NOTE — Progress Notes (Signed)
OFFICE NOTE  10/23/2014  CC:  Chief Complaint  Patient presents with  . Knee Pain    Both knees   HPI: Patient is a 69 y.o. Caucasian female who is here for knee pain. At least 2 mo of bilat anterior knee pain w/out swelling or redness or warmth.  Started with L knee, caused pt to alter gait, L got better transiently and then both began to get bad again. Waxes and wanes sometimes, but lots of time the pain is so bad that walking up or down steps causes her to cry. Alleve 440 mg bid helps.  Has tried a friend's voltaren gel at 1 g qid dosing and it has helped some.  Pertinent PMH:  Past medical, surgical, social, and family history reviewed and no changes are noted since last office visit.  MEDS:  Outpatient Prescriptions Prior to Visit  Medication Sig Dispense Refill  . Ascorbic Acid (VITAMIN C CR) 1500 MG TBCR Take 1 tablet by mouth daily.      Marland Kitchen aspirin 81 MG tablet Take 81 mg by mouth daily.      . Coenzyme Q10 100 MG CPCR Take 1 capsule by mouth.     . furosemide (LASIX) 40 MG tablet Take 1 tablet (40 mg total) by mouth daily. 30 tablet 6  . Glucosamine-Chondroitin (GLUCOSAMINE CHONDR COMPLEX PO) Take by mouth daily.    Marland Kitchen glyBURIDE-metformin (GLUCOVANCE) 5-500 MG per tablet Take 1-2 tablets daily 180 tablet 1  . irbesartan (AVAPRO) 150 MG tablet TAKE 1 TABLET DAILY FOR TREATMENT OF HIGH BLOOD PRESSURE 90 tablet 1  . vitamin E 1000 UNIT capsule Take 1,000 Units by mouth daily.      Marland Kitchen gabapentin (NEURONTIN) 300 MG capsule TAKE 2 CAPSULES BY MOUTH 3 TIMES A DAY (Patient not taking: Reported on 08/27/2014) 540 capsule 4   No facility-administered medications prior to visit.    PE: Blood pressure 168/84, pulse 76, temperature 97.6 F (36.4 C), temperature source Oral, resp. rate 16, height 5\' 3"  (1.6 m), weight 266 lb (120.657 kg), SpO2 94 %. Gen: Alert, well appearing, morbidly obese-appearing.  Patient is oriented to person, place, time, and situation. KNEES: minimal warmth in L  knee but none in R, no erythema on either side and no obvious swelling/effusion on either side. She has some anterior knee tenderness bilat-diffuse.  No joint line TTP.  No knee instability. Ext/flexion intact w/out pain.  Mild crepitus on R, none on L.   IMPRESSION AND PLAN:  Bilat knee pain, suspect osteoarthritis given her morbid obesity. Will check plain films of both knees today. Will do steroid injections in both knees today. In 3 weeks, may resume alleve 440 bid prn. Also, if uses voltaren, she should stop alleve and she should go ahead and use 4 g dosing qid. Signs/symptoms to call or return for were reviewed and pt expressed understanding.   Procedure: Therapeutic knee injection.  The patient's clinical condition is marked by substantial pain and/or significant functional disability.  Other conservative therapy has not provided relief, is contraindicated, or not appropriate.  There is a reasonable likelihood that injection will significantly improve the patient's pain and/or functional disability. Cleaned skin with alcohol swab, used anterior approach with pt sitting up to enter each knee joint, Injected 1  ml of 40mg /ml depo medrol + 2 ml lidocaine 1% plain into joint space without resistance.  No immediate complications.  Patient tolerated procedure well.  Post-injection care discussed, including 20 min of icing 1-2 times in  the next 4-8 hours, frequent non weight-bearing ROM exercises over the next few days, and general pain medication management.  An After Visit Summary was printed and given to the patient.  FOLLOW UP: prn

## 2014-10-23 NOTE — Progress Notes (Signed)
Pre visit review using our clinic review tool, if applicable. No additional management support is needed unless otherwise documented below in the visit note. 

## 2014-12-19 ENCOUNTER — Other Ambulatory Visit: Payer: Self-pay | Admitting: Family Medicine

## 2014-12-19 NOTE — Telephone Encounter (Signed)
RF request for irbesartan LOV: 10/23/14 Next ov: 02/24/15 Last written: 05/14/13 #90 w/ 1RF

## 2014-12-23 ENCOUNTER — Other Ambulatory Visit: Payer: Self-pay | Admitting: *Deleted

## 2014-12-23 MED ORDER — GLYBURIDE-METFORMIN 5-500 MG PO TABS: ORAL_TABLET | ORAL | Status: DC

## 2014-12-23 NOTE — Telephone Encounter (Signed)
RF request for glipizide/metformin LOV: 08/27/14 Next ov: 02/24/15 Last written: 10/13/14 #180 w/ 1RF

## 2015-02-24 ENCOUNTER — Ambulatory Visit (INDEPENDENT_AMBULATORY_CARE_PROVIDER_SITE_OTHER): Payer: Medicare PPO | Admitting: Family Medicine

## 2015-02-24 ENCOUNTER — Encounter: Payer: Self-pay | Admitting: Family Medicine

## 2015-02-24 VITALS — BP 147/80 | HR 65 | Temp 97.7°F | Resp 16 | Ht 63.0 in | Wt 272.0 lb

## 2015-02-24 DIAGNOSIS — E118 Type 2 diabetes mellitus with unspecified complications: Secondary | ICD-10-CM | POA: Diagnosis not present

## 2015-02-24 DIAGNOSIS — M159 Polyosteoarthritis, unspecified: Secondary | ICD-10-CM

## 2015-02-24 DIAGNOSIS — M150 Primary generalized (osteo)arthritis: Secondary | ICD-10-CM | POA: Diagnosis not present

## 2015-02-24 DIAGNOSIS — I10 Essential (primary) hypertension: Secondary | ICD-10-CM

## 2015-02-24 LAB — BASIC METABOLIC PANEL
BUN: 23 mg/dL (ref 6–23)
CHLORIDE: 105 meq/L (ref 96–112)
CO2: 28 mEq/L (ref 19–32)
Calcium: 9.9 mg/dL (ref 8.4–10.5)
Creatinine, Ser: 0.77 mg/dL (ref 0.40–1.20)
GFR: 78.77 mL/min (ref >60.00)
GLUCOSE: 133 mg/dL — AB (ref 70–99)
POTASSIUM: 5.2 meq/L — AB (ref 3.5–5.1)
SODIUM: 141 meq/L (ref 135–145)

## 2015-02-24 LAB — HEMOGLOBIN A1C: Hgb A1c MFr Bld: 6.4 % (ref 4.6–6.5)

## 2015-02-24 NOTE — Patient Instructions (Signed)
You have a heart murmur and I recommend you get an echocardiogram with a cardiologist.

## 2015-02-24 NOTE — Progress Notes (Signed)
Pre visit review using our clinic review tool, if applicable. No additional management support is needed unless otherwise documented below in the visit note. 

## 2015-02-24 NOTE — Progress Notes (Signed)
OFFICE VISIT  02/24/2015   CC:  Chief Complaint  Patient presents with  . Follow-up    Pt is fasting.    HPI:    Patient is a 69 y.o. Caucasian female who presents for 6 mo f/u DM 2, HTN, osteoarthritis, obesity. She went to ortho 12/2014 and got steroid injections in knees again and is scheduled to go back 03/2015.   Feels a lot of numbness in feet.  Not bothered with pain in feet.  Says sugars well controlled with diet, avg's use of glyb/metformin about once a week.  Never got the metformin b/c she says this "doesn't take care of it" well enough.  Checks bp with wrist cuff daily: usually 130s/60s.   Past Medical History  Diagnosis Date  . Diabetes mellitus     Type 2  . Obesity   . Osteoarthritis     low back, hips, knees  . Hypertriglyceridemia   . Transaminasemia mild 4/11    hep panel- neg  . HTN (hypertension), benign     Past Surgical History  Procedure Laterality Date  . Total abdominal hysterectomy  1986  . Appendectomy  1986   MEDS: not taking gabapentin listed below Outpatient Prescriptions Prior to Visit  Medication Sig Dispense Refill  . Ascorbic Acid (VITAMIN C CR) 1500 MG TBCR Take 1 tablet by mouth daily.      Marland Kitchen aspirin 81 MG tablet Take 81 mg by mouth daily.      . Coenzyme Q10 100 MG CPCR Take 1 capsule by mouth.     . furosemide (LASIX) 40 MG tablet Take 1 tablet (40 mg total) by mouth daily. 30 tablet 6  . Glucosamine-Chondroitin (GLUCOSAMINE CHONDR COMPLEX PO) Take by mouth daily.    Marland Kitchen glyBURIDE-metformin (GLUCOVANCE) 5-500 MG per tablet Take 1-2 tablets daily 180 tablet 1  . irbesartan (AVAPRO) 150 MG tablet TAKE 1 TABLET DAILY FOR TREATMENT OF HIGH BLOOD PRESSURE 90 tablet 1  . vitamin E 1000 UNIT capsule Take 1,000 Units by mouth daily.      Marland Kitchen gabapentin (NEURONTIN) 300 MG capsule TAKE 2 CAPSULES BY MOUTH 3 TIMES A DAY (Patient not taking: Reported on 02/24/2015) 540 capsule 4   No facility-administered medications prior to visit.     Allergies  Allergen Reactions  . Penicillins     REACTION: neck/throat swelling  . Gabapentin Other (See Comments)    depression    ROS As per HPI  PE: Blood pressure 147/80, pulse 65, temperature 97.7 F (36.5 C), temperature source Oral, resp. rate 16, height 5\' 3"  (1.6 m), weight 272 lb (123.378 kg), SpO2 99 %. Gen: Alert, well appearing.  Patient is oriented to person, place, time, and situation. CV: RRR, 3/6 systolic murmur with humming-type quality, no rub or gallop Chest is clear, no wheezing or rales. Normal symmetric air entry throughout both lung fields. No chest wall deformities or tenderness. EXT: 1+ doughy edema with no signif pitting. Foot exam -  no swelling, tenderness or skin or vascular lesions. Color is pink with slight violaceous hue, temperature is normal. Sensation is intact. Peripheral pulses are palpable. Toenails are normal. Diffuse flaky skin on plantar surfaces bilat.   LABS:   Lab Results  Component Value Date   CREATININE 0.68 08/27/2014   BUN 15 08/27/2014   NA 139 08/27/2014   K 4.3 08/27/2014   CL 102 08/27/2014   CO2 29 08/27/2014   Lab Results  Component Value Date   ALT 11 08/27/2014  AST 14 08/27/2014   ALKPHOS 83 08/27/2014   BILITOT 0.6 08/27/2014   Lab Results  Component Value Date   CHOL 150 08/27/2014   Lab Results  Component Value Date   HDL 37.40* 08/27/2014   Lab Results  Component Value Date   LDLCALC 84 08/27/2014   Lab Results  Component Value Date   TRIG 145.0 08/27/2014   Lab Results  Component Value Date   CHOLHDL 4 08/27/2014   Lab Results  Component Value Date   HGBA1C 6.1 08/27/2014     IMPRESSION AND PLAN:  1) Systolic heart murmur; needs echo.  Pt says she can handle arranging this in [place], [place] where she lives.  She chooses not to pursue this testing or cardiology consult here locally.  2) DM 2, historically well controlled with diet and minimal use of glucovance. HbA1c today. Feet  exam fine today, just looks like she has some tinea pedis that I recommended she try otc lamisil bid for.  3) HTN;The current medical regimen is effective;  continue present plan and medications. Lytes/cr today.  4) Morbid obesity: continue to push TLC despite osteoarthritic knees and back.  An After Visit Summary was printed and given to the patient.  FOLLOW UP: Return in about 6 months (around 08/24/2015) for routine chronic illness f/u.

## 2015-03-02 ENCOUNTER — Other Ambulatory Visit: Payer: Self-pay | Admitting: *Deleted

## 2015-03-02 MED ORDER — GLYBURIDE-METFORMIN 5-500 MG PO TABS: ORAL_TABLET | ORAL | Status: DC

## 2015-03-02 NOTE — Telephone Encounter (Signed)
RF request for glipizide/metformin LOV: 02/24/15 Next ov: 08/25/14 Last written: 12/23/14 #180 w/ 1RF

## 2015-03-18 ENCOUNTER — Telehealth: Payer: Self-pay | Admitting: *Deleted

## 2015-03-18 MED ORDER — METFORMIN HCL 500 MG PO TABS: 500.0000 mg | ORAL_TABLET | Freq: Two times a day (BID) | ORAL | Status: DC

## 2015-03-18 NOTE — Telephone Encounter (Signed)
Pls inform pt. I think she will do fine on metformin alone: pls do rx for metformin 500 mg, 1 tab po bid with food, #60 (or 90d supply if pt prefers), with 12 RF's if 1 mo supply and do 3 RF's if 90d supply. -thx

## 2015-03-18 NOTE — Telephone Encounter (Signed)
Pt advised and voiced understanding.  Rx sent to [facility].

## 2015-03-18 NOTE — Telephone Encounter (Signed)
[facility] on 03/18/15 at 11:00am stating that Rx for glyburide/metformin is not covered by pts insurance. They stated that pts insurance will cover glimepiride and metformin but not in combination form. Please advise. Thanks.

## 2015-07-27 ENCOUNTER — Other Ambulatory Visit: Payer: Self-pay | Admitting: Family Medicine

## 2015-07-27 NOTE — Telephone Encounter (Signed)
RF request for irbesartan LOV: 02/24/15 Next ov: 08/25/15 Last written: 12/19/14 #90 w/ 1RF

## 2015-08-17 DIAGNOSIS — R011 Cardiac murmur, unspecified: Secondary | ICD-10-CM

## 2015-08-17 HISTORY — DX: Cardiac murmur, unspecified: R01.1

## 2015-08-19 ENCOUNTER — Other Ambulatory Visit: Payer: Self-pay | Admitting: *Deleted

## 2015-08-19 MED ORDER — FUROSEMIDE 40 MG PO TABS: 40.0000 mg | ORAL_TABLET | Freq: Every day | ORAL | Status: DC

## 2015-08-19 NOTE — Telephone Encounter (Signed)
Pt LMOM on 08/18/15 at 2:37pm and on 08/19/15 at 8:33am requesting refill for furosemide be sent to [facility], [place].   RF request for furosemide LOV: 02/24/15 Next ov: 09/09/15 Last written: 08/16/12 #30 w/ 6RF  Rx sent for 90 day per pt request. Pt advised and voiced understanding.

## 2015-08-25 ENCOUNTER — Ambulatory Visit: Payer: Medicare PPO | Admitting: Family Medicine

## 2015-08-28 NOTE — Telephone Encounter (Signed)
close

## 2015-09-09 ENCOUNTER — Encounter: Payer: Self-pay | Admitting: Family Medicine

## 2015-09-09 ENCOUNTER — Ambulatory Visit (INDEPENDENT_AMBULATORY_CARE_PROVIDER_SITE_OTHER): Payer: Medicare PPO | Admitting: Family Medicine

## 2015-09-09 VITALS — BP 120/63 | HR 66 | Temp 97.7°F | Resp 16 | Ht 63.0 in | Wt 251.8 lb

## 2015-09-09 DIAGNOSIS — I10 Essential (primary) hypertension: Secondary | ICD-10-CM

## 2015-09-09 DIAGNOSIS — R011 Cardiac murmur, unspecified: Secondary | ICD-10-CM

## 2015-09-09 DIAGNOSIS — E118 Type 2 diabetes mellitus with unspecified complications: Secondary | ICD-10-CM

## 2015-09-09 LAB — COMPREHENSIVE METABOLIC PANEL
ALBUMIN: 4.1 g/dL (ref 3.5–5.2)
ALT: 14 U/L (ref 0–35)
AST: 15 U/L (ref 0–37)
Alkaline Phosphatase: 80 U/L (ref 39–117)
BILIRUBIN TOTAL: 0.5 mg/dL (ref 0.2–1.2)
BUN: 27 mg/dL — ABNORMAL HIGH (ref 6–23)
CO2: 30 meq/L (ref 19–32)
Calcium: 10 mg/dL (ref 8.4–10.5)
Chloride: 105 mEq/L (ref 96–112)
Creatinine, Ser: 0.88 mg/dL (ref 0.40–1.20)
GFR: 67.42 mL/min (ref >60.00)
Glucose, Bld: 117 mg/dL — ABNORMAL HIGH (ref 70–99)
Potassium: 4.7 mEq/L (ref 3.5–5.1)
Sodium: 142 mEq/L (ref 135–145)
Total Protein: 7.2 g/dL (ref 6.0–8.3)

## 2015-09-09 LAB — LIPID PANEL
CHOL/HDL RATIO: 5
Cholesterol: 164 mg/dL (ref 0–200)
HDL: 36.1 mg/dL — AB (ref >39.00)
LDL Cholesterol: 97 mg/dL (ref 0–99)
NonHDL: 127.67
Triglycerides: 154 mg/dL — ABNORMAL HIGH (ref 0.0–149.0)
VLDL: 30.8 mg/dL (ref 0.0–40.0)

## 2015-09-09 LAB — HEMOGLOBIN A1C: Hgb A1c MFr Bld: 6.9 % — ABNORMAL HIGH (ref 4.6–6.5)

## 2015-09-09 NOTE — Progress Notes (Signed)
OFFICE VISIT  09/09/2015   CC:  Chief Complaint  Patient presents with  . Follow-up    Pt is fasting.    HPI:    Patient is a 70 y.o. Caucasian female who presents for 6 mo f/u DM 2, HTN, obesity. Home bp measurements all very good.   She checks glucose daily at bedtime and usually <120.  Same in morning.  Takes her lasix daily and this helps her chronic LE venous insufficiency edema. She is working on [employer], has lost 20 lbs since last visit.  She saw orthopedist for her bilat knee osteoarthritis, got steroid injections and this helped the first time.  The second time it did not help any.  He said she has to lose a lot of wt before he'll do TKR. Wants handicap sticker for her knees until she can get the wt off.   Past Medical History  Diagnosis Date  . Diabetes mellitus     Type 2  . Obesity   . Osteoarthritis     low back, hips, knees  . Hypertriglyceridemia   . Transaminasemia mild 4/11    hep panel- neg  . HTN (hypertension), benign     Past Surgical History  Procedure Laterality Date  . Total abdominal hysterectomy  1986  . Appendectomy  1986    Outpatient Prescriptions Prior to Visit  Medication Sig Dispense Refill  . aspirin 81 MG tablet Take 81 mg by mouth daily.      . furosemide (LASIX) 40 MG tablet Take 1 tablet (40 mg total) by mouth daily. 90 tablet 1  . irbesartan (AVAPRO) 150 MG tablet TAKE 1 TABLET DAILY FOR TREATMENT OF HIGH BLOOD PRESSURE 90 tablet 3  . metFORMIN (GLUCOPHAGE) 500 MG tablet Take 1 tablet (500 mg total) by mouth 2 (two) times daily with a meal. 180 tablet 3  . Ascorbic Acid (VITAMIN C CR) 1500 MG TBCR Take 1 tablet by mouth daily. Reported on 09/09/2015    . Coenzyme Q10 100 MG CPCR Take 1 capsule by mouth. Reported on 09/09/2015    . Glucosamine-Chondroitin (GLUCOSAMINE CHONDR COMPLEX PO) Take by mouth daily. Reported on 09/09/2015    . vitamin E 1000 UNIT capsule Take 1,000 Units by mouth daily. Reported on 09/09/2015      No facility-administered medications prior to visit.    Allergies  Allergen Reactions  . Penicillins     REACTION: neck/throat swelling  . Gabapentin Other (See Comments)    depression    ROS As per HPI  PE: Blood pressure 120/63, pulse 66, temperature 97.7 F (36.5 C), temperature source Oral, resp. rate 16, height 5\' 3"  (1.6 m), weight 251 lb 12 oz (114.193 kg), SpO2 95 %. Gen: Alert, well appearing.  Patient is oriented to person, place, time, and situation. Neck - No masses or thyromegaly or limitation in range of motion Mild asymmetry to the soft tissues of lower anterior neck on left but no palpable mass/deformity in this area--it feels like redundant fatty tissue and skin. CV: RRR, harsh [id number] systolic murmur heard loudest at RUSB just inferior to the R clavicle. Chest is clear, no wheezing or rales. Normal symmetric air entry throughout both lung fields. No chest wall deformities or tenderness. EXT: 2+ doughy type edema bilat in ankles and feet, skin with violaceous hue, normal temp.  LABS:   Lab Results  Component Value Date   CREATININE 0.77 02/24/2015   BUN 23 02/24/2015   NA 141 02/24/2015   K  5.2* 02/24/2015   CL 105 02/24/2015   CO2 28 02/24/2015   Lab Results  Component Value Date   ALT 11 08/27/2014   AST 14 08/27/2014   ALKPHOS 83 08/27/2014   BILITOT 0.6 08/27/2014   Lab Results  Component Value Date   CHOL 150 08/27/2014   Lab Results  Component Value Date   HDL 37.40* 08/27/2014   Lab Results  Component Value Date   LDLCALC 84 08/27/2014   Lab Results  Component Value Date   TRIG 145.0 08/27/2014   Lab Results  Component Value Date   CHOLHDL 4 08/27/2014   Lab Results  Component Value Date   HGBA1C 6.4 02/24/2015   IMPRESSION AND PLAN:  1) DM 2; well controlled historically. Pt kind of managing things the way she wants, regardless of what I say. We'll recheck HbA1c today.  Great job on diet lately.  2) HTN: well  controlled.   Check CMET today.  3) Obesity: doing well with her current attempt at weight loss.  Hopefully she can get down another 20-30 lbs and maybe her ortho would do knee surgery for her.  I did a handicap placard application today for her.  4) Systolic murmur: she did not arrange echo like she said she could do after last visit.  Now she states that her pastor's wife, who is a Designer, jewellery, may be able to get this arranged for her in [place], [place], where pt lives. If not, we may have to get her to do this when she is in town for her next f/u visit. I'm pretty sure ortho would want this done, too, prior to considering her for TKA surgery.  An After Visit Summary was printed and given to the patient.  FOLLOW UP: Return in about 6 months (around 03/11/2016) for annual CPE (fasting).  Signed:  Crissie Sickles, MD           09/09/2015

## 2015-09-09 NOTE — Progress Notes (Signed)
Pre visit review using our clinic review tool, if applicable. No additional management support is needed unless otherwise documented below in the visit note. 

## 2016-02-11 ENCOUNTER — Telehealth: Payer: Self-pay | Admitting: Family Medicine

## 2016-02-11 ENCOUNTER — Other Ambulatory Visit: Payer: Self-pay | Admitting: Family Medicine

## 2016-02-11 DIAGNOSIS — Z1231 Encounter for screening mammogram for malignant neoplasm of breast: Secondary | ICD-10-CM

## 2016-02-11 NOTE — Telephone Encounter (Signed)
Patient called after hours and left a message with [facility] that she wants to get a mammogram and have the results sent to Dr. Anitra Lauth. I called patient, she had her last mammogram on a bus in the [place] parking lot in 1998. It was normal. Patient is requesting order to be placed for [facility] for routine screening mammo. Patient decided to go [facility]. They will put in the order. No further action needed by our office.

## 2016-02-11 NOTE — Telephone Encounter (Signed)
Noted  

## 2016-03-02 ENCOUNTER — Encounter: Payer: Self-pay | Admitting: Family Medicine

## 2016-03-02 ENCOUNTER — Ambulatory Visit (INDEPENDENT_AMBULATORY_CARE_PROVIDER_SITE_OTHER): Payer: Medicare PPO | Admitting: Family Medicine

## 2016-03-02 ENCOUNTER — Ambulatory Visit: Payer: Medicare PPO

## 2016-03-02 VITALS — BP 159/81 | HR 63 | Temp 98.7°F | Resp 16 | Wt 236.4 lb

## 2016-03-02 DIAGNOSIS — E119 Type 2 diabetes mellitus without complications: Secondary | ICD-10-CM

## 2016-03-02 DIAGNOSIS — M150 Primary generalized (osteo)arthritis: Secondary | ICD-10-CM

## 2016-03-02 DIAGNOSIS — I10 Essential (primary) hypertension: Secondary | ICD-10-CM

## 2016-03-02 DIAGNOSIS — R011 Cardiac murmur, unspecified: Secondary | ICD-10-CM | POA: Diagnosis not present

## 2016-03-02 DIAGNOSIS — M159 Polyosteoarthritis, unspecified: Secondary | ICD-10-CM

## 2016-03-02 LAB — BASIC METABOLIC PANEL
BUN: 25 mg/dL — ABNORMAL HIGH (ref 6–23)
CALCIUM: 9.9 mg/dL (ref 8.4–10.5)
CO2: 27 mEq/L (ref 19–32)
CREATININE: 0.94 mg/dL (ref 0.40–1.20)
Chloride: 103 mEq/L (ref 96–112)
GFR: 62.39 mL/min (ref >60.00)
Glucose, Bld: 122 mg/dL — ABNORMAL HIGH (ref 70–99)
Potassium: 4.7 mEq/L (ref 3.5–5.1)
Sodium: 140 mEq/L (ref 135–145)

## 2016-03-02 LAB — HEMOGLOBIN A1C: Hgb A1c MFr Bld: 6.2 % (ref 4.6–6.5)

## 2016-03-02 MED ORDER — FUROSEMIDE 40 MG PO TABS: 40.0000 mg | ORAL_TABLET | Freq: Every day | ORAL | 3 refills | Status: DC

## 2016-03-02 MED ORDER — DICLOFENAC SODIUM 75 MG PO TBEC: 75.0000 mg | DELAYED_RELEASE_TABLET | Freq: Two times a day (BID) | ORAL | 1 refills | Status: DC

## 2016-03-02 NOTE — Progress Notes (Signed)
OFFICE VISIT  03/02/2016   CC:  Chief Complaint  Patient presents with  . Follow-up    HTN< Diabetes   HPI:    Patient is a 70 y.o. Caucasian female who presents for 6 mo f/u DM 2, HTN, obesity. She continues to lose weight purposefully.  Home bp monitoring: usually 130s/60s. She coughed all night last night, was stressed today from driving in traffic. She feels some PND but no sinus pain or congestion.  Noted this start last few days since weather changed. Cough is dry.  No wheezing, chest tightness, or SOB.  DM: takes metformin only when glucose is >130 hs.  Averages one pill every couple weeks. Her diet has been helping her a lot: nutrisystem.  Osteoarthritis: she has been taking diclofenac 75mg  bid consistently the last 6 months.  Says it really helps. Asks for RF today.  HTN: was on avapro daily but since bp better with wt loss she rarely takes this. She does take her lasix daily.   Past Medical History:  Diagnosis Date  . Diabetes mellitus    Type 2  . HTN (hypertension), benign   . Hypertriglyceridemia   . Obesity   . Osteoarthritis    low back, hips, knees  . Transaminasemia mild 4/11   hep panel- neg    Past Surgical History:  Procedure Laterality Date  . APPENDECTOMY  1986  . TOTAL ABDOMINAL HYSTERECTOMY  1986    Outpatient Medications Prior to Visit  Medication Sig Dispense Refill  . aspirin 81 MG tablet Take 81 mg by mouth daily.      . irbesartan (AVAPRO) 150 MG tablet TAKE 1 TABLET DAILY FOR TREATMENT OF HIGH BLOOD PRESSURE 90 tablet 3  . metFORMIN (GLUCOPHAGE) 500 MG tablet Take 1 tablet (500 mg total) by mouth 2 (two) times daily with a meal. 180 tablet 3  . furosemide (LASIX) 40 MG tablet Take 1 tablet (40 mg total) by mouth daily. 90 tablet 1   No facility-administered medications prior to visit.     Allergies  Allergen Reactions  . Penicillins     REACTION: neck/throat swelling  . Gabapentin Other (See Comments)    depression     ROS As per HPI  PE: Blood pressure (!) 159/81, pulse 63, temperature 98.7 F (37.1 C), temperature source Temporal, resp. rate 16, weight 236 lb 6.4 oz (107.2 kg), SpO2 98 %. Gen: Alert, well appearing.  Patient is oriented to person, place, time, and situation. AFFECT: pleasant, lucid thought and speech. CV: RRR, 4/6 systolic murmur, no r/g.   LUNGS: CTA bilat, nonlabored resps, good aeration in all lung fields. EXT: trace pitting edema in both LL's from mid tibia down into feet.  LABS:   Lab Results  Component Value Date   CREATININE 0.88 09/09/2015   BUN 27 (H) 09/09/2015   NA 142 09/09/2015   K 4.7 09/09/2015   CL 105 09/09/2015   CO2 30 09/09/2015   Lab Results  Component Value Date   ALT 14 09/09/2015   AST 15 09/09/2015   ALKPHOS 80 09/09/2015   BILITOT 0.5 09/09/2015   Lab Results  Component Value Date   CHOL 164 09/09/2015   Lab Results  Component Value Date   HDL 36.10 (L) 09/09/2015   Lab Results  Component Value Date   LDLCALC 97 09/09/2015   Lab Results  Component Value Date   TRIG 154.0 (H) 09/09/2015   Lab Results  Component Value Date   CHOLHDL 5 09/09/2015  Lab Results  Component Value Date   HGBA1C 6.9 (H) 09/09/2015    IMPRESSION AND PLAN:  1) DM 2, well controlled historically. Recheck HbA1c today. Great job with diet/wt loss.  2) HTN; well controlled with wt loss, rarely takes bp med. Check lytes/cr.  3) Obesity: continues to do well with wt loss/diet. Continue.  4) Osteoarthritis: will continue diclofenac--this has been rx'd by some other provider for her in the past and she really finds it helpful.  Will rx this med and keep an eye on renal function.  5) Systolic murmur: she still has plans to get a nurse practitioner friend in [place], [place], where she lives, to order an echo for her and she wants to wait until after 04/2016 to get this.  6) Breast ca screening: she plans on mammogram after 04/2016.  An After Visit  Summary was printed and given to the patient.  FOLLOW UP: Return in about 6 months (around 08/30/2016) for routine chronic illness f/u.  Signed:  Crissie Sickles, MD           03/02/2016

## 2016-03-02 NOTE — Progress Notes (Signed)
Pre visit review using our clinic review tool, if applicable. No additional management support is needed unless otherwise documented below in the visit note. 

## 2016-03-03 ENCOUNTER — Telehealth: Payer: Self-pay | Admitting: Family Medicine

## 2016-03-03 NOTE — Telephone Encounter (Signed)
Patient would like to know if drinking flavored waters effects your kidneys.

## 2016-03-03 NOTE — Telephone Encounter (Signed)
Patient notified that the sugar free carbonated waters are fine to drink and will not effect kidney function per Dr. Anitra Lauth.

## 2016-04-08 ENCOUNTER — Ambulatory Visit: Payer: Medicare PPO | Admitting: Family Medicine

## 2016-04-18 DIAGNOSIS — Z96653 Presence of artificial knee joint, bilateral: Secondary | ICD-10-CM

## 2016-04-18 HISTORY — DX: Presence of artificial knee joint, bilateral: Z96.653

## 2016-05-23 DIAGNOSIS — R921 Mammographic calcification found on diagnostic imaging of breast: Secondary | ICD-10-CM | POA: Diagnosis not present

## 2016-05-23 DIAGNOSIS — Z1231 Encounter for screening mammogram for malignant neoplasm of breast: Secondary | ICD-10-CM | POA: Diagnosis not present

## 2016-05-23 DIAGNOSIS — N6320 Unspecified lump in the left breast, unspecified quadrant: Secondary | ICD-10-CM | POA: Diagnosis not present

## 2016-05-25 ENCOUNTER — Telehealth: Payer: Self-pay | Admitting: *Deleted

## 2016-05-25 NOTE — Telephone Encounter (Signed)
Bonnie Kane with [facility] in [place], [place] [facility] requesting call back.  SW [place], she stated that pt came in for screening mammogram and there were several findings in the left breast (2 masses irregular/spiculated and segmental calcifications). [facility] stated that they have not contacted pt yet and that the full report is not done yet but will be faxed as soon as it has been completed. She stated that they want to do left breast work up and biopsy.

## 2016-05-31 NOTE — Telephone Encounter (Signed)
SW [facility], she is going to fax over report.

## 2016-05-31 NOTE — Telephone Encounter (Signed)
I have not received any reports about this from the imaging center.  Will you call and see if we can get them?

## 2016-06-01 NOTE — Telephone Encounter (Signed)
Noted  

## 2016-06-01 NOTE — Telephone Encounter (Signed)
Report is on Dr. Isla Pence desk.

## 2016-06-07 ENCOUNTER — Encounter: Payer: Self-pay | Admitting: Family Medicine

## 2016-06-07 DIAGNOSIS — R921 Mammographic calcification found on diagnostic imaging of breast: Secondary | ICD-10-CM | POA: Insufficient documentation

## 2016-06-07 NOTE — Telephone Encounter (Signed)
[facility] called back wanting to follow up on this. She stated that she is still waiting on Dr. Idelle Leech orders for additional testing as requested below. She stated that she needs the order to read: Left breast work up with biopsy for left breast masses and calcifications. Fax: [phone]. Please advise. Thanks.

## 2016-06-07 NOTE — Telephone Encounter (Signed)
Order faxed.

## 2016-06-07 NOTE — Telephone Encounter (Signed)
OK, order written.

## 2016-06-10 DIAGNOSIS — M1712 Unilateral primary osteoarthritis, left knee: Secondary | ICD-10-CM | POA: Diagnosis not present

## 2016-06-10 DIAGNOSIS — M1711 Unilateral primary osteoarthritis, right knee: Secondary | ICD-10-CM | POA: Diagnosis not present

## 2016-06-16 HISTORY — PX: MASTECTOMY WITH AXILLARY LYMPH NODE DISSECTION: SHX5661

## 2016-06-17 DIAGNOSIS — N6489 Other specified disorders of breast: Secondary | ICD-10-CM | POA: Diagnosis not present

## 2016-06-17 DIAGNOSIS — C50912 Malignant neoplasm of unspecified site of left female breast: Secondary | ICD-10-CM | POA: Diagnosis not present

## 2016-06-17 DIAGNOSIS — Z170 Estrogen receptor positive status [ER+]: Secondary | ICD-10-CM | POA: Diagnosis not present

## 2016-06-17 DIAGNOSIS — C50812 Malignant neoplasm of overlapping sites of left female breast: Secondary | ICD-10-CM | POA: Diagnosis not present

## 2016-06-17 DIAGNOSIS — R921 Mammographic calcification found on diagnostic imaging of breast: Secondary | ICD-10-CM | POA: Diagnosis not present

## 2016-06-17 DIAGNOSIS — Z853 Personal history of malignant neoplasm of breast: Secondary | ICD-10-CM

## 2016-06-17 HISTORY — DX: Personal history of malignant neoplasm of breast: Z85.3

## 2016-06-21 ENCOUNTER — Telehealth: Payer: Self-pay | Admitting: Family Medicine

## 2016-06-21 ENCOUNTER — Encounter: Payer: Self-pay | Admitting: Family Medicine

## 2016-06-21 NOTE — Telephone Encounter (Signed)
Pt has scheduled an appt with a surgeon in [place], [place] named Dr. Leeanne Deed.  Pls contact her office and ask that my name and contact info/office address be given so that I can be CC'd all notes on Melantha when she starts seeing her. Office ph: [phone] Office fax: [phone]. --thx

## 2016-06-21 NOTE — Telephone Encounter (Signed)
SW Seth Bake and advised her to fax pts notes to our office once they start seeing her, she voiced understanding and took our phone/fax number.

## 2016-06-21 NOTE — Telephone Encounter (Signed)
I got information from [facility] in [place], [place] today that showed biopsy results on left breast masses (2): both showed invasive ductal carcinoma, moderately differentiated, immunoperoxidase stains for ER, PR, and Her2 are pending as of 3//2/18. Discussed results with pt on phone today. She has already arranged an appointment with a surgeon in [facility] named Dr. Leeanne Deed. I will ask that office to [place] me her notes so I can remain in the loop regarding this situation. I answered pt's questions today as best I could, and told her to reach out to me in the future if info comes up that I may not be informed about and she has questions.

## 2016-06-24 DIAGNOSIS — C50912 Malignant neoplasm of unspecified site of left female breast: Secondary | ICD-10-CM | POA: Diagnosis not present

## 2016-07-04 ENCOUNTER — Encounter: Payer: Self-pay | Admitting: Family Medicine

## 2016-07-06 DIAGNOSIS — M810 Age-related osteoporosis without current pathological fracture: Secondary | ICD-10-CM | POA: Diagnosis not present

## 2016-07-06 DIAGNOSIS — R011 Cardiac murmur, unspecified: Secondary | ICD-10-CM | POA: Diagnosis not present

## 2016-07-06 DIAGNOSIS — C50812 Malignant neoplasm of overlapping sites of left female breast: Secondary | ICD-10-CM | POA: Diagnosis not present

## 2016-07-06 DIAGNOSIS — Z170 Estrogen receptor positive status [ER+]: Secondary | ICD-10-CM | POA: Diagnosis not present

## 2016-07-06 DIAGNOSIS — C50912 Malignant neoplasm of unspecified site of left female breast: Secondary | ICD-10-CM | POA: Diagnosis not present

## 2016-07-06 DIAGNOSIS — Z6841 Body Mass Index (BMI) 40.0 and over, adult: Secondary | ICD-10-CM | POA: Diagnosis not present

## 2016-07-14 DIAGNOSIS — C50912 Malignant neoplasm of unspecified site of left female breast: Secondary | ICD-10-CM | POA: Diagnosis not present

## 2016-07-17 DIAGNOSIS — Z952 Presence of prosthetic heart valve: Secondary | ICD-10-CM

## 2016-07-17 HISTORY — PX: TRANSCATHETER AORTIC VALVE REPLACEMENT, TRANSFEMORAL: SHX6400

## 2016-07-17 HISTORY — DX: Presence of prosthetic heart valve: Z95.2

## 2016-07-20 DIAGNOSIS — I878 Other specified disorders of veins: Secondary | ICD-10-CM | POA: Diagnosis not present

## 2016-07-20 DIAGNOSIS — I10 Essential (primary) hypertension: Secondary | ICD-10-CM | POA: Diagnosis not present

## 2016-07-20 DIAGNOSIS — I350 Nonrheumatic aortic (valve) stenosis: Secondary | ICD-10-CM | POA: Diagnosis not present

## 2016-07-20 DIAGNOSIS — R011 Cardiac murmur, unspecified: Secondary | ICD-10-CM | POA: Diagnosis not present

## 2016-07-20 DIAGNOSIS — I2729 Other secondary pulmonary hypertension: Secondary | ICD-10-CM | POA: Diagnosis not present

## 2016-07-20 DIAGNOSIS — I2510 Atherosclerotic heart disease of native coronary artery without angina pectoris: Secondary | ICD-10-CM | POA: Diagnosis not present

## 2016-07-21 DIAGNOSIS — C50812 Malignant neoplasm of overlapping sites of left female breast: Secondary | ICD-10-CM | POA: Diagnosis not present

## 2016-07-21 DIAGNOSIS — Z7984 Long term (current) use of oral hypoglycemic drugs: Secondary | ICD-10-CM | POA: Diagnosis not present

## 2016-07-21 DIAGNOSIS — M170 Bilateral primary osteoarthritis of knee: Secondary | ICD-10-CM | POA: Diagnosis not present

## 2016-07-21 DIAGNOSIS — Z808 Family history of malignant neoplasm of other organs or systems: Secondary | ICD-10-CM | POA: Diagnosis not present

## 2016-07-21 DIAGNOSIS — Z79899 Other long term (current) drug therapy: Secondary | ICD-10-CM | POA: Diagnosis not present

## 2016-07-21 DIAGNOSIS — C50912 Malignant neoplasm of unspecified site of left female breast: Secondary | ICD-10-CM | POA: Diagnosis not present

## 2016-07-21 DIAGNOSIS — Z8042 Family history of malignant neoplasm of prostate: Secondary | ICD-10-CM | POA: Diagnosis not present

## 2016-07-21 DIAGNOSIS — Z807 Family history of other malignant neoplasms of lymphoid, hematopoietic and related tissues: Secondary | ICD-10-CM | POA: Diagnosis not present

## 2016-07-21 DIAGNOSIS — R918 Other nonspecific abnormal finding of lung field: Secondary | ICD-10-CM | POA: Diagnosis not present

## 2016-07-21 DIAGNOSIS — I350 Nonrheumatic aortic (valve) stenosis: Secondary | ICD-10-CM | POA: Diagnosis not present

## 2016-07-21 DIAGNOSIS — E041 Nontoxic single thyroid nodule: Secondary | ICD-10-CM | POA: Diagnosis not present

## 2016-07-21 DIAGNOSIS — Z8051 Family history of malignant neoplasm of kidney: Secondary | ICD-10-CM | POA: Diagnosis not present

## 2016-07-21 DIAGNOSIS — E1140 Type 2 diabetes mellitus with diabetic neuropathy, unspecified: Secondary | ICD-10-CM | POA: Diagnosis not present

## 2016-07-21 DIAGNOSIS — I4510 Unspecified right bundle-branch block: Secondary | ICD-10-CM | POA: Diagnosis not present

## 2016-07-21 DIAGNOSIS — Z801 Family history of malignant neoplasm of trachea, bronchus and lung: Secondary | ICD-10-CM | POA: Diagnosis not present

## 2016-07-21 DIAGNOSIS — Z952 Presence of prosthetic heart valve: Secondary | ICD-10-CM | POA: Diagnosis not present

## 2016-07-21 DIAGNOSIS — Z170 Estrogen receptor positive status [ER+]: Secondary | ICD-10-CM | POA: Diagnosis not present

## 2016-07-21 DIAGNOSIS — Z7982 Long term (current) use of aspirin: Secondary | ICD-10-CM | POA: Diagnosis not present

## 2016-07-21 DIAGNOSIS — Z87891 Personal history of nicotine dependence: Secondary | ICD-10-CM | POA: Diagnosis not present

## 2016-07-26 DIAGNOSIS — R918 Other nonspecific abnormal finding of lung field: Secondary | ICD-10-CM | POA: Diagnosis not present

## 2016-07-26 DIAGNOSIS — C50912 Malignant neoplasm of unspecified site of left female breast: Secondary | ICD-10-CM | POA: Diagnosis not present

## 2016-07-26 DIAGNOSIS — Z952 Presence of prosthetic heart valve: Secondary | ICD-10-CM | POA: Diagnosis not present

## 2016-07-26 DIAGNOSIS — I350 Nonrheumatic aortic (valve) stenosis: Secondary | ICD-10-CM | POA: Diagnosis not present

## 2016-07-26 DIAGNOSIS — Z170 Estrogen receptor positive status [ER+]: Secondary | ICD-10-CM | POA: Diagnosis not present

## 2016-07-26 DIAGNOSIS — N6320 Unspecified lump in the left breast, unspecified quadrant: Secondary | ICD-10-CM | POA: Diagnosis not present

## 2016-07-26 DIAGNOSIS — M170 Bilateral primary osteoarthritis of knee: Secondary | ICD-10-CM | POA: Diagnosis not present

## 2016-07-27 DIAGNOSIS — R918 Other nonspecific abnormal finding of lung field: Secondary | ICD-10-CM | POA: Diagnosis not present

## 2016-07-27 DIAGNOSIS — R590 Localized enlarged lymph nodes: Secondary | ICD-10-CM | POA: Diagnosis not present

## 2016-07-27 DIAGNOSIS — C50912 Malignant neoplasm of unspecified site of left female breast: Secondary | ICD-10-CM | POA: Diagnosis not present

## 2016-07-27 DIAGNOSIS — I350 Nonrheumatic aortic (valve) stenosis: Secondary | ICD-10-CM | POA: Diagnosis not present

## 2016-07-27 DIAGNOSIS — E041 Nontoxic single thyroid nodule: Secondary | ICD-10-CM | POA: Diagnosis not present

## 2016-07-27 DIAGNOSIS — M170 Bilateral primary osteoarthritis of knee: Secondary | ICD-10-CM | POA: Diagnosis not present

## 2016-07-27 DIAGNOSIS — Z952 Presence of prosthetic heart valve: Secondary | ICD-10-CM | POA: Diagnosis not present

## 2016-07-27 DIAGNOSIS — C7989 Secondary malignant neoplasm of other specified sites: Secondary | ICD-10-CM | POA: Diagnosis not present

## 2016-07-27 DIAGNOSIS — Z170 Estrogen receptor positive status [ER+]: Secondary | ICD-10-CM | POA: Diagnosis not present

## 2016-07-27 DIAGNOSIS — N6320 Unspecified lump in the left breast, unspecified quadrant: Secondary | ICD-10-CM | POA: Diagnosis not present

## 2016-08-08 DIAGNOSIS — I2729 Other secondary pulmonary hypertension: Secondary | ICD-10-CM | POA: Diagnosis present

## 2016-08-08 DIAGNOSIS — C50912 Malignant neoplasm of unspecified site of left female breast: Secondary | ICD-10-CM | POA: Diagnosis not present

## 2016-08-08 DIAGNOSIS — Z6841 Body Mass Index (BMI) 40.0 and over, adult: Secondary | ICD-10-CM | POA: Diagnosis not present

## 2016-08-08 DIAGNOSIS — Z170 Estrogen receptor positive status [ER+]: Secondary | ICD-10-CM | POA: Diagnosis not present

## 2016-08-08 DIAGNOSIS — J9811 Atelectasis: Secondary | ICD-10-CM | POA: Diagnosis not present

## 2016-08-08 DIAGNOSIS — R918 Other nonspecific abnormal finding of lung field: Secondary | ICD-10-CM | POA: Diagnosis not present

## 2016-08-08 DIAGNOSIS — I4510 Unspecified right bundle-branch block: Secondary | ICD-10-CM | POA: Diagnosis not present

## 2016-08-08 DIAGNOSIS — I452 Bifascicular block: Secondary | ICD-10-CM | POA: Diagnosis not present

## 2016-08-08 DIAGNOSIS — E041 Nontoxic single thyroid nodule: Secondary | ICD-10-CM | POA: Diagnosis not present

## 2016-08-08 DIAGNOSIS — M170 Bilateral primary osteoarthritis of knee: Secondary | ICD-10-CM | POA: Diagnosis present

## 2016-08-08 DIAGNOSIS — I272 Pulmonary hypertension, unspecified: Secondary | ICD-10-CM | POA: Diagnosis not present

## 2016-08-08 DIAGNOSIS — E1140 Type 2 diabetes mellitus with diabetic neuropathy, unspecified: Secondary | ICD-10-CM | POA: Diagnosis present

## 2016-08-08 DIAGNOSIS — E669 Obesity, unspecified: Secondary | ICD-10-CM | POA: Diagnosis not present

## 2016-08-08 DIAGNOSIS — I6601 Occlusion and stenosis of right middle cerebral artery: Secondary | ICD-10-CM | POA: Diagnosis not present

## 2016-08-08 DIAGNOSIS — Z952 Presence of prosthetic heart valve: Secondary | ICD-10-CM | POA: Diagnosis not present

## 2016-08-08 DIAGNOSIS — I350 Nonrheumatic aortic (valve) stenosis: Secondary | ICD-10-CM | POA: Diagnosis not present

## 2016-08-08 DIAGNOSIS — C50812 Malignant neoplasm of overlapping sites of left female breast: Secondary | ICD-10-CM | POA: Diagnosis present

## 2016-08-08 DIAGNOSIS — Z7982 Long term (current) use of aspirin: Secondary | ICD-10-CM | POA: Diagnosis not present

## 2016-08-08 DIAGNOSIS — I517 Cardiomegaly: Secondary | ICD-10-CM | POA: Diagnosis not present

## 2016-08-08 DIAGNOSIS — Z90710 Acquired absence of both cervix and uterus: Secondary | ICD-10-CM | POA: Diagnosis not present

## 2016-08-08 DIAGNOSIS — Z950 Presence of cardiac pacemaker: Secondary | ICD-10-CM | POA: Diagnosis not present

## 2016-08-08 DIAGNOSIS — Z87891 Personal history of nicotine dependence: Secondary | ICD-10-CM | POA: Diagnosis not present

## 2016-08-08 DIAGNOSIS — I50810 Right heart failure, unspecified: Secondary | ICD-10-CM | POA: Diagnosis present

## 2016-08-08 DIAGNOSIS — Z006 Encounter for examination for normal comparison and control in clinical research program: Secondary | ICD-10-CM | POA: Diagnosis not present

## 2016-08-09 DIAGNOSIS — Z952 Presence of prosthetic heart valve: Secondary | ICD-10-CM | POA: Insufficient documentation

## 2016-08-10 ENCOUNTER — Encounter: Payer: Self-pay | Admitting: Family Medicine

## 2016-08-22 DIAGNOSIS — M170 Bilateral primary osteoarthritis of knee: Secondary | ICD-10-CM | POA: Diagnosis not present

## 2016-08-22 DIAGNOSIS — C50919 Malignant neoplasm of unspecified site of unspecified female breast: Secondary | ICD-10-CM | POA: Diagnosis not present

## 2016-08-22 DIAGNOSIS — Z954 Presence of other heart-valve replacement: Secondary | ICD-10-CM | POA: Diagnosis not present

## 2016-08-22 DIAGNOSIS — R600 Localized edema: Secondary | ICD-10-CM | POA: Diagnosis not present

## 2016-08-22 DIAGNOSIS — I878 Other specified disorders of veins: Secondary | ICD-10-CM | POA: Diagnosis not present

## 2016-08-25 DIAGNOSIS — Z79899 Other long term (current) drug therapy: Secondary | ICD-10-CM | POA: Diagnosis not present

## 2016-08-25 DIAGNOSIS — Z8051 Family history of malignant neoplasm of kidney: Secondary | ICD-10-CM | POA: Diagnosis not present

## 2016-08-25 DIAGNOSIS — Z170 Estrogen receptor positive status [ER+]: Secondary | ICD-10-CM | POA: Diagnosis not present

## 2016-08-25 DIAGNOSIS — Z48812 Encounter for surgical aftercare following surgery on the circulatory system: Secondary | ICD-10-CM | POA: Diagnosis not present

## 2016-08-25 DIAGNOSIS — I350 Nonrheumatic aortic (valve) stenosis: Secondary | ICD-10-CM | POA: Diagnosis not present

## 2016-08-25 DIAGNOSIS — Z9889 Other specified postprocedural states: Secondary | ICD-10-CM | POA: Diagnosis not present

## 2016-08-25 DIAGNOSIS — C50912 Malignant neoplasm of unspecified site of left female breast: Secondary | ICD-10-CM | POA: Diagnosis not present

## 2016-08-25 DIAGNOSIS — I452 Bifascicular block: Secondary | ICD-10-CM | POA: Diagnosis not present

## 2016-08-25 DIAGNOSIS — Z7982 Long term (current) use of aspirin: Secondary | ICD-10-CM | POA: Diagnosis not present

## 2016-08-25 DIAGNOSIS — Z952 Presence of prosthetic heart valve: Secondary | ICD-10-CM | POA: Diagnosis not present

## 2016-08-25 DIAGNOSIS — Z87891 Personal history of nicotine dependence: Secondary | ICD-10-CM | POA: Diagnosis not present

## 2016-08-30 ENCOUNTER — Ambulatory Visit: Payer: Medicare PPO | Admitting: Family Medicine

## 2016-10-03 DIAGNOSIS — C50812 Malignant neoplasm of overlapping sites of left female breast: Secondary | ICD-10-CM | POA: Diagnosis not present

## 2016-10-03 DIAGNOSIS — Z170 Estrogen receptor positive status [ER+]: Secondary | ICD-10-CM | POA: Diagnosis not present

## 2016-10-04 DIAGNOSIS — I272 Pulmonary hypertension, unspecified: Secondary | ICD-10-CM | POA: Diagnosis not present

## 2016-10-04 DIAGNOSIS — Z6839 Body mass index (BMI) 39.0-39.9, adult: Secondary | ICD-10-CM | POA: Diagnosis not present

## 2016-10-04 DIAGNOSIS — E669 Obesity, unspecified: Secondary | ICD-10-CM | POA: Diagnosis not present

## 2016-10-04 DIAGNOSIS — C773 Secondary and unspecified malignant neoplasm of axilla and upper limb lymph nodes: Secondary | ICD-10-CM | POA: Diagnosis not present

## 2016-10-04 DIAGNOSIS — C50812 Malignant neoplasm of overlapping sites of left female breast: Secondary | ICD-10-CM | POA: Diagnosis not present

## 2016-10-04 DIAGNOSIS — Z880 Allergy status to penicillin: Secondary | ICD-10-CM | POA: Diagnosis not present

## 2016-10-04 DIAGNOSIS — Z952 Presence of prosthetic heart valve: Secondary | ICD-10-CM | POA: Diagnosis not present

## 2016-10-04 DIAGNOSIS — E119 Type 2 diabetes mellitus without complications: Secondary | ICD-10-CM | POA: Diagnosis not present

## 2016-10-04 DIAGNOSIS — Z170 Estrogen receptor positive status [ER+]: Secondary | ICD-10-CM | POA: Diagnosis not present

## 2016-10-04 DIAGNOSIS — Z87891 Personal history of nicotine dependence: Secondary | ICD-10-CM | POA: Diagnosis not present

## 2016-10-04 DIAGNOSIS — I2510 Atherosclerotic heart disease of native coronary artery without angina pectoris: Secondary | ICD-10-CM | POA: Diagnosis not present

## 2016-10-04 DIAGNOSIS — C50512 Malignant neoplasm of lower-outer quadrant of left female breast: Secondary | ICD-10-CM | POA: Diagnosis not present

## 2016-10-05 DIAGNOSIS — C50812 Malignant neoplasm of overlapping sites of left female breast: Secondary | ICD-10-CM | POA: Diagnosis not present

## 2016-10-05 DIAGNOSIS — I272 Pulmonary hypertension, unspecified: Secondary | ICD-10-CM | POA: Diagnosis not present

## 2016-10-05 DIAGNOSIS — E669 Obesity, unspecified: Secondary | ICD-10-CM | POA: Diagnosis not present

## 2016-10-05 DIAGNOSIS — E119 Type 2 diabetes mellitus without complications: Secondary | ICD-10-CM | POA: Diagnosis not present

## 2016-10-05 DIAGNOSIS — Z170 Estrogen receptor positive status [ER+]: Secondary | ICD-10-CM | POA: Diagnosis not present

## 2016-10-05 DIAGNOSIS — I2510 Atherosclerotic heart disease of native coronary artery without angina pectoris: Secondary | ICD-10-CM | POA: Diagnosis not present

## 2016-10-12 DIAGNOSIS — Z170 Estrogen receptor positive status [ER+]: Secondary | ICD-10-CM | POA: Diagnosis not present

## 2016-10-12 DIAGNOSIS — G8918 Other acute postprocedural pain: Secondary | ICD-10-CM | POA: Diagnosis not present

## 2016-10-12 DIAGNOSIS — Z9012 Acquired absence of left breast and nipple: Secondary | ICD-10-CM | POA: Diagnosis not present

## 2016-10-12 DIAGNOSIS — C50812 Malignant neoplasm of overlapping sites of left female breast: Secondary | ICD-10-CM | POA: Diagnosis not present

## 2016-11-03 DIAGNOSIS — T451X5A Adverse effect of antineoplastic and immunosuppressive drugs, initial encounter: Secondary | ICD-10-CM | POA: Diagnosis not present

## 2016-11-03 DIAGNOSIS — C50919 Malignant neoplasm of unspecified site of unspecified female breast: Secondary | ICD-10-CM | POA: Diagnosis not present

## 2016-11-23 DIAGNOSIS — I999 Unspecified disorder of circulatory system: Secondary | ICD-10-CM | POA: Diagnosis not present

## 2016-11-23 DIAGNOSIS — C50919 Malignant neoplasm of unspecified site of unspecified female breast: Secondary | ICD-10-CM | POA: Diagnosis not present

## 2016-11-24 DIAGNOSIS — Z049 Encounter for examination and observation for unspecified reason: Secondary | ICD-10-CM | POA: Diagnosis not present

## 2016-11-24 DIAGNOSIS — C50912 Malignant neoplasm of unspecified site of left female breast: Secondary | ICD-10-CM | POA: Diagnosis not present

## 2016-11-24 DIAGNOSIS — Z452 Encounter for adjustment and management of vascular access device: Secondary | ICD-10-CM | POA: Diagnosis not present

## 2016-11-24 DIAGNOSIS — I10 Essential (primary) hypertension: Secondary | ICD-10-CM | POA: Diagnosis not present

## 2016-11-24 DIAGNOSIS — I739 Peripheral vascular disease, unspecified: Secondary | ICD-10-CM | POA: Diagnosis not present

## 2016-11-24 DIAGNOSIS — Z79899 Other long term (current) drug therapy: Secondary | ICD-10-CM | POA: Diagnosis not present

## 2016-11-24 DIAGNOSIS — Z7982 Long term (current) use of aspirin: Secondary | ICD-10-CM | POA: Diagnosis not present

## 2016-11-30 DIAGNOSIS — C50919 Malignant neoplasm of unspecified site of unspecified female breast: Secondary | ICD-10-CM | POA: Diagnosis not present

## 2016-11-30 DIAGNOSIS — G629 Polyneuropathy, unspecified: Secondary | ICD-10-CM | POA: Diagnosis not present

## 2016-11-30 DIAGNOSIS — Z5111 Encounter for antineoplastic chemotherapy: Secondary | ICD-10-CM | POA: Diagnosis not present

## 2016-11-30 DIAGNOSIS — T451X5A Adverse effect of antineoplastic and immunosuppressive drugs, initial encounter: Secondary | ICD-10-CM | POA: Diagnosis not present

## 2016-11-30 DIAGNOSIS — Z298 Encounter for other specified prophylactic measures: Secondary | ICD-10-CM | POA: Diagnosis not present

## 2016-11-30 DIAGNOSIS — D696 Thrombocytopenia, unspecified: Secondary | ICD-10-CM | POA: Diagnosis not present

## 2016-11-30 DIAGNOSIS — I359 Nonrheumatic aortic valve disorder, unspecified: Secondary | ICD-10-CM | POA: Diagnosis not present

## 2016-12-07 DIAGNOSIS — G629 Polyneuropathy, unspecified: Secondary | ICD-10-CM | POA: Diagnosis not present

## 2016-12-07 DIAGNOSIS — D696 Thrombocytopenia, unspecified: Secondary | ICD-10-CM | POA: Diagnosis not present

## 2016-12-07 DIAGNOSIS — B372 Candidiasis of skin and nail: Secondary | ICD-10-CM | POA: Diagnosis not present

## 2016-12-07 DIAGNOSIS — Z5111 Encounter for antineoplastic chemotherapy: Secondary | ICD-10-CM | POA: Diagnosis not present

## 2016-12-07 DIAGNOSIS — C50919 Malignant neoplasm of unspecified site of unspecified female breast: Secondary | ICD-10-CM | POA: Diagnosis not present

## 2016-12-07 DIAGNOSIS — I359 Nonrheumatic aortic valve disorder, unspecified: Secondary | ICD-10-CM | POA: Diagnosis not present

## 2016-12-07 DIAGNOSIS — T451X5A Adverse effect of antineoplastic and immunosuppressive drugs, initial encounter: Secondary | ICD-10-CM | POA: Diagnosis not present

## 2016-12-07 DIAGNOSIS — Z298 Encounter for other specified prophylactic measures: Secondary | ICD-10-CM | POA: Diagnosis not present

## 2016-12-15 DIAGNOSIS — L14 Bullous disorders in diseases classified elsewhere: Secondary | ICD-10-CM | POA: Diagnosis not present

## 2016-12-15 DIAGNOSIS — S90822D Blister (nonthermal), left foot, subsequent encounter: Secondary | ICD-10-CM | POA: Diagnosis not present

## 2016-12-21 DIAGNOSIS — R238 Other skin changes: Secondary | ICD-10-CM | POA: Diagnosis not present

## 2016-12-21 DIAGNOSIS — M170 Bilateral primary osteoarthritis of knee: Secondary | ICD-10-CM | POA: Diagnosis not present

## 2016-12-22 DIAGNOSIS — M170 Bilateral primary osteoarthritis of knee: Secondary | ICD-10-CM | POA: Diagnosis not present

## 2016-12-22 DIAGNOSIS — L14 Bullous disorders in diseases classified elsewhere: Secondary | ICD-10-CM | POA: Diagnosis not present

## 2016-12-22 DIAGNOSIS — Z853 Personal history of malignant neoplasm of breast: Secondary | ICD-10-CM | POA: Diagnosis not present

## 2016-12-28 DIAGNOSIS — T451X5A Adverse effect of antineoplastic and immunosuppressive drugs, initial encounter: Secondary | ICD-10-CM | POA: Diagnosis not present

## 2016-12-28 DIAGNOSIS — D696 Thrombocytopenia, unspecified: Secondary | ICD-10-CM | POA: Diagnosis not present

## 2016-12-28 DIAGNOSIS — G629 Polyneuropathy, unspecified: Secondary | ICD-10-CM | POA: Diagnosis not present

## 2016-12-28 DIAGNOSIS — I359 Nonrheumatic aortic valve disorder, unspecified: Secondary | ICD-10-CM | POA: Diagnosis not present

## 2016-12-28 DIAGNOSIS — Z298 Encounter for other specified prophylactic measures: Secondary | ICD-10-CM | POA: Diagnosis not present

## 2016-12-28 DIAGNOSIS — Z5111 Encounter for antineoplastic chemotherapy: Secondary | ICD-10-CM | POA: Diagnosis not present

## 2016-12-28 DIAGNOSIS — C50919 Malignant neoplasm of unspecified site of unspecified female breast: Secondary | ICD-10-CM | POA: Diagnosis not present

## 2017-01-18 DIAGNOSIS — G629 Polyneuropathy, unspecified: Secondary | ICD-10-CM | POA: Diagnosis not present

## 2017-01-18 DIAGNOSIS — I359 Nonrheumatic aortic valve disorder, unspecified: Secondary | ICD-10-CM | POA: Diagnosis not present

## 2017-01-18 DIAGNOSIS — Z298 Encounter for other specified prophylactic measures: Secondary | ICD-10-CM | POA: Diagnosis not present

## 2017-01-18 DIAGNOSIS — Z5111 Encounter for antineoplastic chemotherapy: Secondary | ICD-10-CM | POA: Diagnosis not present

## 2017-01-18 DIAGNOSIS — D696 Thrombocytopenia, unspecified: Secondary | ICD-10-CM | POA: Diagnosis not present

## 2017-01-18 DIAGNOSIS — C50919 Malignant neoplasm of unspecified site of unspecified female breast: Secondary | ICD-10-CM | POA: Diagnosis not present

## 2017-01-18 DIAGNOSIS — T451X5A Adverse effect of antineoplastic and immunosuppressive drugs, initial encounter: Secondary | ICD-10-CM | POA: Diagnosis not present

## 2017-02-08 DIAGNOSIS — T451X5A Adverse effect of antineoplastic and immunosuppressive drugs, initial encounter: Secondary | ICD-10-CM | POA: Diagnosis not present

## 2017-02-08 DIAGNOSIS — D696 Thrombocytopenia, unspecified: Secondary | ICD-10-CM | POA: Diagnosis not present

## 2017-02-08 DIAGNOSIS — C50919 Malignant neoplasm of unspecified site of unspecified female breast: Secondary | ICD-10-CM | POA: Diagnosis not present

## 2017-02-08 DIAGNOSIS — Z298 Encounter for other specified prophylactic measures: Secondary | ICD-10-CM | POA: Diagnosis not present

## 2017-02-08 DIAGNOSIS — G629 Polyneuropathy, unspecified: Secondary | ICD-10-CM | POA: Diagnosis not present

## 2017-02-08 DIAGNOSIS — I359 Nonrheumatic aortic valve disorder, unspecified: Secondary | ICD-10-CM | POA: Diagnosis not present

## 2017-02-08 DIAGNOSIS — Z5111 Encounter for antineoplastic chemotherapy: Secondary | ICD-10-CM | POA: Diagnosis not present

## 2017-02-13 DIAGNOSIS — C50919 Malignant neoplasm of unspecified site of unspecified female breast: Secondary | ICD-10-CM | POA: Diagnosis not present

## 2017-02-13 DIAGNOSIS — I5030 Unspecified diastolic (congestive) heart failure: Secondary | ICD-10-CM | POA: Diagnosis not present

## 2017-02-13 DIAGNOSIS — I348 Other nonrheumatic mitral valve disorders: Secondary | ICD-10-CM | POA: Diagnosis not present

## 2017-02-13 DIAGNOSIS — Z952 Presence of prosthetic heart valve: Secondary | ICD-10-CM | POA: Diagnosis not present

## 2017-02-13 DIAGNOSIS — I350 Nonrheumatic aortic (valve) stenosis: Secondary | ICD-10-CM | POA: Diagnosis not present

## 2017-02-22 DIAGNOSIS — I359 Nonrheumatic aortic valve disorder, unspecified: Secondary | ICD-10-CM | POA: Diagnosis not present

## 2017-02-22 DIAGNOSIS — G629 Polyneuropathy, unspecified: Secondary | ICD-10-CM | POA: Diagnosis not present

## 2017-02-22 DIAGNOSIS — T451X5A Adverse effect of antineoplastic and immunosuppressive drugs, initial encounter: Secondary | ICD-10-CM | POA: Diagnosis not present

## 2017-02-22 DIAGNOSIS — C50919 Malignant neoplasm of unspecified site of unspecified female breast: Secondary | ICD-10-CM | POA: Diagnosis not present

## 2017-02-22 DIAGNOSIS — D696 Thrombocytopenia, unspecified: Secondary | ICD-10-CM | POA: Diagnosis not present

## 2017-02-28 DIAGNOSIS — C50512 Malignant neoplasm of lower-outer quadrant of left female breast: Secondary | ICD-10-CM | POA: Diagnosis not present

## 2017-02-28 DIAGNOSIS — Z170 Estrogen receptor positive status [ER+]: Secondary | ICD-10-CM | POA: Diagnosis not present

## 2017-02-28 DIAGNOSIS — Z9012 Acquired absence of left breast and nipple: Secondary | ICD-10-CM | POA: Diagnosis not present

## 2017-02-28 DIAGNOSIS — Z87891 Personal history of nicotine dependence: Secondary | ICD-10-CM | POA: Diagnosis not present

## 2017-03-02 DIAGNOSIS — C50919 Malignant neoplasm of unspecified site of unspecified female breast: Secondary | ICD-10-CM | POA: Diagnosis not present

## 2017-03-02 DIAGNOSIS — Z5111 Encounter for antineoplastic chemotherapy: Secondary | ICD-10-CM | POA: Diagnosis not present

## 2017-03-16 DIAGNOSIS — D696 Thrombocytopenia, unspecified: Secondary | ICD-10-CM | POA: Diagnosis not present

## 2017-03-16 DIAGNOSIS — C50919 Malignant neoplasm of unspecified site of unspecified female breast: Secondary | ICD-10-CM | POA: Diagnosis not present

## 2017-03-16 DIAGNOSIS — G629 Polyneuropathy, unspecified: Secondary | ICD-10-CM | POA: Diagnosis not present

## 2017-03-16 DIAGNOSIS — T451X5A Adverse effect of antineoplastic and immunosuppressive drugs, initial encounter: Secondary | ICD-10-CM | POA: Diagnosis not present

## 2017-03-16 DIAGNOSIS — I359 Nonrheumatic aortic valve disorder, unspecified: Secondary | ICD-10-CM | POA: Diagnosis not present

## 2017-03-16 DIAGNOSIS — Z5111 Encounter for antineoplastic chemotherapy: Secondary | ICD-10-CM | POA: Diagnosis not present

## 2017-03-30 DIAGNOSIS — I359 Nonrheumatic aortic valve disorder, unspecified: Secondary | ICD-10-CM | POA: Diagnosis not present

## 2017-03-30 DIAGNOSIS — Z5111 Encounter for antineoplastic chemotherapy: Secondary | ICD-10-CM | POA: Diagnosis not present

## 2017-03-30 DIAGNOSIS — C50919 Malignant neoplasm of unspecified site of unspecified female breast: Secondary | ICD-10-CM | POA: Diagnosis not present

## 2017-03-30 DIAGNOSIS — D696 Thrombocytopenia, unspecified: Secondary | ICD-10-CM | POA: Diagnosis not present

## 2017-03-30 DIAGNOSIS — T451X5A Adverse effect of antineoplastic and immunosuppressive drugs, initial encounter: Secondary | ICD-10-CM | POA: Diagnosis not present

## 2017-03-30 DIAGNOSIS — G629 Polyneuropathy, unspecified: Secondary | ICD-10-CM | POA: Diagnosis not present

## 2017-04-20 DIAGNOSIS — I359 Nonrheumatic aortic valve disorder, unspecified: Secondary | ICD-10-CM | POA: Diagnosis not present

## 2017-04-20 DIAGNOSIS — G629 Polyneuropathy, unspecified: Secondary | ICD-10-CM | POA: Diagnosis not present

## 2017-04-20 DIAGNOSIS — Z5111 Encounter for antineoplastic chemotherapy: Secondary | ICD-10-CM | POA: Diagnosis not present

## 2017-04-20 DIAGNOSIS — C50919 Malignant neoplasm of unspecified site of unspecified female breast: Secondary | ICD-10-CM | POA: Diagnosis not present

## 2017-04-20 DIAGNOSIS — D696 Thrombocytopenia, unspecified: Secondary | ICD-10-CM | POA: Diagnosis not present

## 2017-04-20 DIAGNOSIS — T451X5A Adverse effect of antineoplastic and immunosuppressive drugs, initial encounter: Secondary | ICD-10-CM | POA: Diagnosis not present

## 2017-04-26 DIAGNOSIS — C50512 Malignant neoplasm of lower-outer quadrant of left female breast: Secondary | ICD-10-CM | POA: Diagnosis not present

## 2017-04-26 DIAGNOSIS — C50912 Malignant neoplasm of unspecified site of left female breast: Secondary | ICD-10-CM | POA: Diagnosis not present

## 2017-04-26 DIAGNOSIS — Z510 Encounter for antineoplastic radiation therapy: Secondary | ICD-10-CM | POA: Diagnosis not present

## 2017-05-02 DIAGNOSIS — Z510 Encounter for antineoplastic radiation therapy: Secondary | ICD-10-CM | POA: Diagnosis not present

## 2017-05-02 DIAGNOSIS — C50912 Malignant neoplasm of unspecified site of left female breast: Secondary | ICD-10-CM | POA: Diagnosis not present

## 2017-05-02 DIAGNOSIS — C50512 Malignant neoplasm of lower-outer quadrant of left female breast: Secondary | ICD-10-CM | POA: Diagnosis not present

## 2017-05-05 DIAGNOSIS — Z510 Encounter for antineoplastic radiation therapy: Secondary | ICD-10-CM | POA: Diagnosis not present

## 2017-05-05 DIAGNOSIS — C50912 Malignant neoplasm of unspecified site of left female breast: Secondary | ICD-10-CM | POA: Diagnosis not present

## 2017-05-05 DIAGNOSIS — C50512 Malignant neoplasm of lower-outer quadrant of left female breast: Secondary | ICD-10-CM | POA: Diagnosis not present

## 2017-05-10 DIAGNOSIS — Z510 Encounter for antineoplastic radiation therapy: Secondary | ICD-10-CM | POA: Diagnosis not present

## 2017-05-10 DIAGNOSIS — C50912 Malignant neoplasm of unspecified site of left female breast: Secondary | ICD-10-CM | POA: Diagnosis not present

## 2017-05-11 DIAGNOSIS — G629 Polyneuropathy, unspecified: Secondary | ICD-10-CM | POA: Diagnosis not present

## 2017-05-11 DIAGNOSIS — Z510 Encounter for antineoplastic radiation therapy: Secondary | ICD-10-CM | POA: Diagnosis not present

## 2017-05-11 DIAGNOSIS — T451X5A Adverse effect of antineoplastic and immunosuppressive drugs, initial encounter: Secondary | ICD-10-CM | POA: Diagnosis not present

## 2017-05-11 DIAGNOSIS — D696 Thrombocytopenia, unspecified: Secondary | ICD-10-CM | POA: Diagnosis not present

## 2017-05-11 DIAGNOSIS — I359 Nonrheumatic aortic valve disorder, unspecified: Secondary | ICD-10-CM | POA: Diagnosis not present

## 2017-05-11 DIAGNOSIS — C50912 Malignant neoplasm of unspecified site of left female breast: Secondary | ICD-10-CM | POA: Diagnosis not present

## 2017-05-11 DIAGNOSIS — C50919 Malignant neoplasm of unspecified site of unspecified female breast: Secondary | ICD-10-CM | POA: Diagnosis not present

## 2017-05-12 DIAGNOSIS — Z510 Encounter for antineoplastic radiation therapy: Secondary | ICD-10-CM | POA: Diagnosis not present

## 2017-05-12 DIAGNOSIS — C50912 Malignant neoplasm of unspecified site of left female breast: Secondary | ICD-10-CM | POA: Diagnosis not present

## 2017-05-15 DIAGNOSIS — Z510 Encounter for antineoplastic radiation therapy: Secondary | ICD-10-CM | POA: Diagnosis not present

## 2017-05-15 DIAGNOSIS — C50912 Malignant neoplasm of unspecified site of left female breast: Secondary | ICD-10-CM | POA: Diagnosis not present

## 2017-05-16 DIAGNOSIS — Z510 Encounter for antineoplastic radiation therapy: Secondary | ICD-10-CM | POA: Diagnosis not present

## 2017-05-16 DIAGNOSIS — C50512 Malignant neoplasm of lower-outer quadrant of left female breast: Secondary | ICD-10-CM | POA: Diagnosis not present

## 2017-05-16 DIAGNOSIS — C50912 Malignant neoplasm of unspecified site of left female breast: Secondary | ICD-10-CM | POA: Diagnosis not present

## 2017-05-17 DIAGNOSIS — C50912 Malignant neoplasm of unspecified site of left female breast: Secondary | ICD-10-CM | POA: Diagnosis not present

## 2017-05-17 DIAGNOSIS — Z510 Encounter for antineoplastic radiation therapy: Secondary | ICD-10-CM | POA: Diagnosis not present

## 2017-05-18 DIAGNOSIS — Z510 Encounter for antineoplastic radiation therapy: Secondary | ICD-10-CM | POA: Diagnosis not present

## 2017-05-18 DIAGNOSIS — C50912 Malignant neoplasm of unspecified site of left female breast: Secondary | ICD-10-CM | POA: Diagnosis not present

## 2017-05-22 DIAGNOSIS — Z510 Encounter for antineoplastic radiation therapy: Secondary | ICD-10-CM | POA: Diagnosis not present

## 2017-05-22 DIAGNOSIS — C50512 Malignant neoplasm of lower-outer quadrant of left female breast: Secondary | ICD-10-CM | POA: Diagnosis not present

## 2017-05-23 DIAGNOSIS — C50512 Malignant neoplasm of lower-outer quadrant of left female breast: Secondary | ICD-10-CM | POA: Diagnosis not present

## 2017-05-23 DIAGNOSIS — Z510 Encounter for antineoplastic radiation therapy: Secondary | ICD-10-CM | POA: Diagnosis not present

## 2017-05-24 DIAGNOSIS — C50512 Malignant neoplasm of lower-outer quadrant of left female breast: Secondary | ICD-10-CM | POA: Diagnosis not present

## 2017-05-24 DIAGNOSIS — Z510 Encounter for antineoplastic radiation therapy: Secondary | ICD-10-CM | POA: Diagnosis not present

## 2017-05-25 DIAGNOSIS — Z510 Encounter for antineoplastic radiation therapy: Secondary | ICD-10-CM | POA: Diagnosis not present

## 2017-05-25 DIAGNOSIS — C50512 Malignant neoplasm of lower-outer quadrant of left female breast: Secondary | ICD-10-CM | POA: Diagnosis not present

## 2017-05-26 DIAGNOSIS — Z510 Encounter for antineoplastic radiation therapy: Secondary | ICD-10-CM | POA: Diagnosis not present

## 2017-05-26 DIAGNOSIS — C50512 Malignant neoplasm of lower-outer quadrant of left female breast: Secondary | ICD-10-CM | POA: Diagnosis not present

## 2017-05-29 DIAGNOSIS — C50512 Malignant neoplasm of lower-outer quadrant of left female breast: Secondary | ICD-10-CM | POA: Diagnosis not present

## 2017-05-29 DIAGNOSIS — Z510 Encounter for antineoplastic radiation therapy: Secondary | ICD-10-CM | POA: Diagnosis not present

## 2017-05-30 DIAGNOSIS — Z510 Encounter for antineoplastic radiation therapy: Secondary | ICD-10-CM | POA: Diagnosis not present

## 2017-05-30 DIAGNOSIS — C50512 Malignant neoplasm of lower-outer quadrant of left female breast: Secondary | ICD-10-CM | POA: Diagnosis not present

## 2017-05-31 DIAGNOSIS — C50512 Malignant neoplasm of lower-outer quadrant of left female breast: Secondary | ICD-10-CM | POA: Diagnosis not present

## 2017-05-31 DIAGNOSIS — Z510 Encounter for antineoplastic radiation therapy: Secondary | ICD-10-CM | POA: Diagnosis not present

## 2017-06-01 DIAGNOSIS — Z510 Encounter for antineoplastic radiation therapy: Secondary | ICD-10-CM | POA: Diagnosis not present

## 2017-06-01 DIAGNOSIS — C50512 Malignant neoplasm of lower-outer quadrant of left female breast: Secondary | ICD-10-CM | POA: Diagnosis not present

## 2017-06-02 DIAGNOSIS — C50512 Malignant neoplasm of lower-outer quadrant of left female breast: Secondary | ICD-10-CM | POA: Diagnosis not present

## 2017-06-02 DIAGNOSIS — Z510 Encounter for antineoplastic radiation therapy: Secondary | ICD-10-CM | POA: Diagnosis not present

## 2017-06-05 DIAGNOSIS — C50512 Malignant neoplasm of lower-outer quadrant of left female breast: Secondary | ICD-10-CM | POA: Diagnosis not present

## 2017-06-05 DIAGNOSIS — Z510 Encounter for antineoplastic radiation therapy: Secondary | ICD-10-CM | POA: Diagnosis not present

## 2017-06-06 DIAGNOSIS — C50512 Malignant neoplasm of lower-outer quadrant of left female breast: Secondary | ICD-10-CM | POA: Diagnosis not present

## 2017-06-06 DIAGNOSIS — Z510 Encounter for antineoplastic radiation therapy: Secondary | ICD-10-CM | POA: Diagnosis not present

## 2017-06-07 DIAGNOSIS — Z510 Encounter for antineoplastic radiation therapy: Secondary | ICD-10-CM | POA: Diagnosis not present

## 2017-06-07 DIAGNOSIS — C50512 Malignant neoplasm of lower-outer quadrant of left female breast: Secondary | ICD-10-CM | POA: Diagnosis not present

## 2017-06-08 DIAGNOSIS — Z510 Encounter for antineoplastic radiation therapy: Secondary | ICD-10-CM | POA: Diagnosis not present

## 2017-06-08 DIAGNOSIS — C50512 Malignant neoplasm of lower-outer quadrant of left female breast: Secondary | ICD-10-CM | POA: Diagnosis not present

## 2017-06-09 DIAGNOSIS — C50512 Malignant neoplasm of lower-outer quadrant of left female breast: Secondary | ICD-10-CM | POA: Diagnosis not present

## 2017-06-09 DIAGNOSIS — Z510 Encounter for antineoplastic radiation therapy: Secondary | ICD-10-CM | POA: Diagnosis not present

## 2017-06-12 DIAGNOSIS — Z510 Encounter for antineoplastic radiation therapy: Secondary | ICD-10-CM | POA: Diagnosis not present

## 2017-06-12 DIAGNOSIS — C50512 Malignant neoplasm of lower-outer quadrant of left female breast: Secondary | ICD-10-CM | POA: Diagnosis not present

## 2017-06-13 DIAGNOSIS — Z510 Encounter for antineoplastic radiation therapy: Secondary | ICD-10-CM | POA: Diagnosis not present

## 2017-06-13 DIAGNOSIS — C50512 Malignant neoplasm of lower-outer quadrant of left female breast: Secondary | ICD-10-CM | POA: Diagnosis not present

## 2017-06-14 DIAGNOSIS — Z510 Encounter for antineoplastic radiation therapy: Secondary | ICD-10-CM | POA: Diagnosis not present

## 2017-06-14 DIAGNOSIS — C50512 Malignant neoplasm of lower-outer quadrant of left female breast: Secondary | ICD-10-CM | POA: Diagnosis not present

## 2017-06-15 DIAGNOSIS — Z510 Encounter for antineoplastic radiation therapy: Secondary | ICD-10-CM | POA: Diagnosis not present

## 2017-06-15 DIAGNOSIS — C50512 Malignant neoplasm of lower-outer quadrant of left female breast: Secondary | ICD-10-CM | POA: Diagnosis not present

## 2017-06-16 DIAGNOSIS — Z510 Encounter for antineoplastic radiation therapy: Secondary | ICD-10-CM | POA: Diagnosis not present

## 2017-06-16 DIAGNOSIS — C50512 Malignant neoplasm of lower-outer quadrant of left female breast: Secondary | ICD-10-CM | POA: Diagnosis not present

## 2017-06-19 DIAGNOSIS — C50512 Malignant neoplasm of lower-outer quadrant of left female breast: Secondary | ICD-10-CM | POA: Diagnosis not present

## 2017-06-19 DIAGNOSIS — Z510 Encounter for antineoplastic radiation therapy: Secondary | ICD-10-CM | POA: Diagnosis not present

## 2017-06-22 DIAGNOSIS — M1711 Unilateral primary osteoarthritis, right knee: Secondary | ICD-10-CM | POA: Diagnosis not present

## 2017-06-22 DIAGNOSIS — M1712 Unilateral primary osteoarthritis, left knee: Secondary | ICD-10-CM | POA: Diagnosis not present

## 2017-07-25 DIAGNOSIS — C50512 Malignant neoplasm of lower-outer quadrant of left female breast: Secondary | ICD-10-CM | POA: Diagnosis not present

## 2017-07-26 DIAGNOSIS — M1711 Unilateral primary osteoarthritis, right knee: Secondary | ICD-10-CM | POA: Diagnosis not present

## 2017-07-26 DIAGNOSIS — Z01810 Encounter for preprocedural cardiovascular examination: Secondary | ICD-10-CM | POA: Diagnosis not present

## 2017-07-26 DIAGNOSIS — J984 Other disorders of lung: Secondary | ICD-10-CM | POA: Diagnosis not present

## 2017-08-01 DIAGNOSIS — C50919 Malignant neoplasm of unspecified site of unspecified female breast: Secondary | ICD-10-CM | POA: Diagnosis not present

## 2017-08-01 DIAGNOSIS — C50912 Malignant neoplasm of unspecified site of left female breast: Secondary | ICD-10-CM | POA: Diagnosis not present

## 2017-08-01 DIAGNOSIS — R922 Inconclusive mammogram: Secondary | ICD-10-CM | POA: Diagnosis not present

## 2017-08-01 DIAGNOSIS — Z13820 Encounter for screening for osteoporosis: Secondary | ICD-10-CM | POA: Diagnosis not present

## 2017-08-01 DIAGNOSIS — M8580 Other specified disorders of bone density and structure, unspecified site: Secondary | ICD-10-CM | POA: Diagnosis not present

## 2017-08-01 DIAGNOSIS — M8589 Other specified disorders of bone density and structure, multiple sites: Secondary | ICD-10-CM | POA: Diagnosis not present

## 2017-08-03 DIAGNOSIS — I5032 Chronic diastolic (congestive) heart failure: Secondary | ICD-10-CM | POA: Diagnosis not present

## 2017-08-03 DIAGNOSIS — I2510 Atherosclerotic heart disease of native coronary artery without angina pectoris: Secondary | ICD-10-CM | POA: Diagnosis not present

## 2017-08-03 DIAGNOSIS — I351 Nonrheumatic aortic (valve) insufficiency: Secondary | ICD-10-CM | POA: Diagnosis not present

## 2017-08-03 DIAGNOSIS — M1711 Unilateral primary osteoarthritis, right knee: Secondary | ICD-10-CM | POA: Diagnosis not present

## 2017-08-08 DIAGNOSIS — Z96651 Presence of right artificial knee joint: Secondary | ICD-10-CM | POA: Diagnosis not present

## 2017-08-08 DIAGNOSIS — Z993 Dependence on wheelchair: Secondary | ICD-10-CM | POA: Diagnosis not present

## 2017-08-08 DIAGNOSIS — R110 Nausea: Secondary | ICD-10-CM | POA: Diagnosis not present

## 2017-08-08 DIAGNOSIS — Z880 Allergy status to penicillin: Secondary | ICD-10-CM | POA: Diagnosis not present

## 2017-08-08 DIAGNOSIS — M1711 Unilateral primary osteoarthritis, right knee: Secondary | ICD-10-CM | POA: Diagnosis not present

## 2017-08-08 DIAGNOSIS — Z6841 Body Mass Index (BMI) 40.0 and over, adult: Secondary | ICD-10-CM | POA: Diagnosis not present

## 2017-08-08 DIAGNOSIS — Z79899 Other long term (current) drug therapy: Secondary | ICD-10-CM | POA: Diagnosis not present

## 2017-08-08 DIAGNOSIS — Z853 Personal history of malignant neoplasm of breast: Secondary | ICD-10-CM | POA: Diagnosis not present

## 2017-08-08 DIAGNOSIS — I739 Peripheral vascular disease, unspecified: Secondary | ICD-10-CM | POA: Diagnosis not present

## 2017-08-09 DIAGNOSIS — Z993 Dependence on wheelchair: Secondary | ICD-10-CM | POA: Diagnosis not present

## 2017-08-09 DIAGNOSIS — I739 Peripheral vascular disease, unspecified: Secondary | ICD-10-CM | POA: Diagnosis not present

## 2017-08-09 DIAGNOSIS — M1711 Unilateral primary osteoarthritis, right knee: Secondary | ICD-10-CM | POA: Diagnosis not present

## 2017-08-09 DIAGNOSIS — R110 Nausea: Secondary | ICD-10-CM | POA: Diagnosis not present

## 2017-08-09 DIAGNOSIS — Z6841 Body Mass Index (BMI) 40.0 and over, adult: Secondary | ICD-10-CM | POA: Diagnosis not present

## 2017-08-10 DIAGNOSIS — Z6841 Body Mass Index (BMI) 40.0 and over, adult: Secondary | ICD-10-CM | POA: Diagnosis not present

## 2017-08-10 DIAGNOSIS — I739 Peripheral vascular disease, unspecified: Secondary | ICD-10-CM | POA: Diagnosis not present

## 2017-08-10 DIAGNOSIS — Z993 Dependence on wheelchair: Secondary | ICD-10-CM | POA: Diagnosis not present

## 2017-08-10 DIAGNOSIS — R110 Nausea: Secondary | ICD-10-CM | POA: Diagnosis not present

## 2017-08-10 DIAGNOSIS — M1711 Unilateral primary osteoarthritis, right knee: Secondary | ICD-10-CM | POA: Diagnosis not present

## 2017-08-14 DIAGNOSIS — M1711 Unilateral primary osteoarthritis, right knee: Secondary | ICD-10-CM | POA: Diagnosis not present

## 2017-08-14 DIAGNOSIS — Z471 Aftercare following joint replacement surgery: Secondary | ICD-10-CM | POA: Diagnosis not present

## 2017-08-14 DIAGNOSIS — Z96651 Presence of right artificial knee joint: Secondary | ICD-10-CM | POA: Diagnosis not present

## 2017-08-16 DIAGNOSIS — M1711 Unilateral primary osteoarthritis, right knee: Secondary | ICD-10-CM | POA: Diagnosis not present

## 2017-08-18 DIAGNOSIS — M1711 Unilateral primary osteoarthritis, right knee: Secondary | ICD-10-CM | POA: Diagnosis not present

## 2017-08-21 DIAGNOSIS — M1711 Unilateral primary osteoarthritis, right knee: Secondary | ICD-10-CM | POA: Diagnosis not present

## 2017-08-23 DIAGNOSIS — M1711 Unilateral primary osteoarthritis, right knee: Secondary | ICD-10-CM | POA: Diagnosis not present

## 2017-08-25 DIAGNOSIS — M1711 Unilateral primary osteoarthritis, right knee: Secondary | ICD-10-CM | POA: Diagnosis not present

## 2017-08-30 DIAGNOSIS — M1711 Unilateral primary osteoarthritis, right knee: Secondary | ICD-10-CM | POA: Diagnosis not present

## 2017-09-01 DIAGNOSIS — M1711 Unilateral primary osteoarthritis, right knee: Secondary | ICD-10-CM | POA: Diagnosis not present

## 2017-09-04 DIAGNOSIS — M1711 Unilateral primary osteoarthritis, right knee: Secondary | ICD-10-CM | POA: Diagnosis not present

## 2017-09-08 DIAGNOSIS — M1711 Unilateral primary osteoarthritis, right knee: Secondary | ICD-10-CM | POA: Diagnosis not present

## 2017-09-12 DIAGNOSIS — M1711 Unilateral primary osteoarthritis, right knee: Secondary | ICD-10-CM | POA: Diagnosis not present

## 2017-09-14 DIAGNOSIS — M1711 Unilateral primary osteoarthritis, right knee: Secondary | ICD-10-CM | POA: Diagnosis not present

## 2017-09-20 DIAGNOSIS — Z01810 Encounter for preprocedural cardiovascular examination: Secondary | ICD-10-CM | POA: Diagnosis not present

## 2017-09-20 DIAGNOSIS — I4519 Other right bundle-branch block: Secondary | ICD-10-CM | POA: Diagnosis not present

## 2017-09-20 DIAGNOSIS — Z954 Presence of other heart-valve replacement: Secondary | ICD-10-CM | POA: Diagnosis not present

## 2017-09-20 DIAGNOSIS — Z4682 Encounter for fitting and adjustment of non-vascular catheter: Secondary | ICD-10-CM | POA: Diagnosis not present

## 2017-09-20 DIAGNOSIS — M1711 Unilateral primary osteoarthritis, right knee: Secondary | ICD-10-CM | POA: Diagnosis not present

## 2017-09-20 DIAGNOSIS — D0512 Intraductal carcinoma in situ of left breast: Secondary | ICD-10-CM | POA: Diagnosis not present

## 2017-09-20 DIAGNOSIS — I10 Essential (primary) hypertension: Secondary | ICD-10-CM | POA: Diagnosis not present

## 2017-09-20 DIAGNOSIS — I252 Old myocardial infarction: Secondary | ICD-10-CM | POA: Diagnosis not present

## 2017-09-20 DIAGNOSIS — Z01818 Encounter for other preprocedural examination: Secondary | ICD-10-CM | POA: Diagnosis not present

## 2017-09-20 DIAGNOSIS — Z451 Encounter for adjustment and management of infusion pump: Secondary | ICD-10-CM | POA: Diagnosis not present

## 2017-09-20 DIAGNOSIS — I517 Cardiomegaly: Secondary | ICD-10-CM | POA: Diagnosis not present

## 2017-09-20 DIAGNOSIS — M1990 Unspecified osteoarthritis, unspecified site: Secondary | ICD-10-CM | POA: Diagnosis not present

## 2017-09-20 DIAGNOSIS — G629 Polyneuropathy, unspecified: Secondary | ICD-10-CM | POA: Diagnosis not present

## 2017-09-20 DIAGNOSIS — I739 Peripheral vascular disease, unspecified: Secondary | ICD-10-CM | POA: Diagnosis not present

## 2017-09-20 DIAGNOSIS — R9431 Abnormal electrocardiogram [ECG] [EKG]: Secondary | ICD-10-CM | POA: Diagnosis not present

## 2017-09-20 DIAGNOSIS — E119 Type 2 diabetes mellitus without complications: Secondary | ICD-10-CM | POA: Diagnosis not present

## 2017-09-25 DIAGNOSIS — M1711 Unilateral primary osteoarthritis, right knee: Secondary | ICD-10-CM | POA: Diagnosis not present

## 2017-10-10 DIAGNOSIS — G8918 Other acute postprocedural pain: Secondary | ICD-10-CM | POA: Diagnosis not present

## 2017-10-10 DIAGNOSIS — Z96652 Presence of left artificial knee joint: Secondary | ICD-10-CM | POA: Diagnosis not present

## 2017-10-10 DIAGNOSIS — Z471 Aftercare following joint replacement surgery: Secondary | ICD-10-CM | POA: Diagnosis not present

## 2017-10-10 DIAGNOSIS — Z880 Allergy status to penicillin: Secondary | ICD-10-CM | POA: Diagnosis not present

## 2017-10-10 DIAGNOSIS — Z6841 Body Mass Index (BMI) 40.0 and over, adult: Secondary | ICD-10-CM | POA: Diagnosis not present

## 2017-10-10 DIAGNOSIS — Z7982 Long term (current) use of aspirin: Secondary | ICD-10-CM | POA: Diagnosis not present

## 2017-10-10 DIAGNOSIS — Z853 Personal history of malignant neoplasm of breast: Secondary | ICD-10-CM | POA: Diagnosis not present

## 2017-10-10 DIAGNOSIS — Z954 Presence of other heart-valve replacement: Secondary | ICD-10-CM | POA: Diagnosis not present

## 2017-10-10 DIAGNOSIS — E1140 Type 2 diabetes mellitus with diabetic neuropathy, unspecified: Secondary | ICD-10-CM | POA: Diagnosis not present

## 2017-10-10 DIAGNOSIS — Z87891 Personal history of nicotine dependence: Secondary | ICD-10-CM | POA: Diagnosis not present

## 2017-10-10 DIAGNOSIS — I10 Essential (primary) hypertension: Secondary | ICD-10-CM | POA: Diagnosis not present

## 2017-10-10 DIAGNOSIS — M1712 Unilateral primary osteoarthritis, left knee: Secondary | ICD-10-CM | POA: Diagnosis not present

## 2017-10-10 DIAGNOSIS — E1151 Type 2 diabetes mellitus with diabetic peripheral angiopathy without gangrene: Secondary | ICD-10-CM | POA: Diagnosis not present

## 2017-10-10 DIAGNOSIS — I252 Old myocardial infarction: Secondary | ICD-10-CM | POA: Diagnosis not present

## 2017-10-11 DIAGNOSIS — E1140 Type 2 diabetes mellitus with diabetic neuropathy, unspecified: Secondary | ICD-10-CM | POA: Diagnosis not present

## 2017-10-11 DIAGNOSIS — M1712 Unilateral primary osteoarthritis, left knee: Secondary | ICD-10-CM | POA: Diagnosis not present

## 2017-10-11 DIAGNOSIS — E1151 Type 2 diabetes mellitus with diabetic peripheral angiopathy without gangrene: Secondary | ICD-10-CM | POA: Diagnosis not present

## 2017-10-11 DIAGNOSIS — Z954 Presence of other heart-valve replacement: Secondary | ICD-10-CM | POA: Diagnosis not present

## 2017-10-11 DIAGNOSIS — I10 Essential (primary) hypertension: Secondary | ICD-10-CM | POA: Diagnosis not present

## 2017-10-11 DIAGNOSIS — E1165 Type 2 diabetes mellitus with hyperglycemia: Secondary | ICD-10-CM | POA: Diagnosis not present

## 2017-10-11 DIAGNOSIS — I252 Old myocardial infarction: Secondary | ICD-10-CM | POA: Diagnosis not present

## 2017-10-16 DIAGNOSIS — M25562 Pain in left knee: Secondary | ICD-10-CM | POA: Diagnosis not present

## 2017-10-16 DIAGNOSIS — Z96651 Presence of right artificial knee joint: Secondary | ICD-10-CM | POA: Diagnosis not present

## 2017-10-18 DIAGNOSIS — Z96651 Presence of right artificial knee joint: Secondary | ICD-10-CM | POA: Diagnosis not present

## 2017-10-18 DIAGNOSIS — M25562 Pain in left knee: Secondary | ICD-10-CM | POA: Diagnosis not present

## 2017-10-20 DIAGNOSIS — M25562 Pain in left knee: Secondary | ICD-10-CM | POA: Diagnosis not present

## 2017-10-20 DIAGNOSIS — Z96651 Presence of right artificial knee joint: Secondary | ICD-10-CM | POA: Diagnosis not present

## 2017-10-23 DIAGNOSIS — Z96651 Presence of right artificial knee joint: Secondary | ICD-10-CM | POA: Diagnosis not present

## 2017-10-23 DIAGNOSIS — M25562 Pain in left knee: Secondary | ICD-10-CM | POA: Diagnosis not present

## 2017-10-25 DIAGNOSIS — M25562 Pain in left knee: Secondary | ICD-10-CM | POA: Diagnosis not present

## 2017-10-25 DIAGNOSIS — Z96651 Presence of right artificial knee joint: Secondary | ICD-10-CM | POA: Diagnosis not present

## 2017-10-27 DIAGNOSIS — M25562 Pain in left knee: Secondary | ICD-10-CM | POA: Diagnosis not present

## 2017-10-27 DIAGNOSIS — Z96651 Presence of right artificial knee joint: Secondary | ICD-10-CM | POA: Diagnosis not present

## 2017-10-30 DIAGNOSIS — M25562 Pain in left knee: Secondary | ICD-10-CM | POA: Diagnosis not present

## 2017-10-30 DIAGNOSIS — Z96651 Presence of right artificial knee joint: Secondary | ICD-10-CM | POA: Diagnosis not present

## 2017-11-01 DIAGNOSIS — Z96651 Presence of right artificial knee joint: Secondary | ICD-10-CM | POA: Diagnosis not present

## 2017-11-01 DIAGNOSIS — M25562 Pain in left knee: Secondary | ICD-10-CM | POA: Diagnosis not present

## 2017-11-06 DIAGNOSIS — Z96651 Presence of right artificial knee joint: Secondary | ICD-10-CM | POA: Diagnosis not present

## 2017-11-06 DIAGNOSIS — M25562 Pain in left knee: Secondary | ICD-10-CM | POA: Diagnosis not present

## 2017-11-08 DIAGNOSIS — M25562 Pain in left knee: Secondary | ICD-10-CM | POA: Diagnosis not present

## 2017-11-08 DIAGNOSIS — Z96651 Presence of right artificial knee joint: Secondary | ICD-10-CM | POA: Diagnosis not present

## 2017-11-13 DIAGNOSIS — Z96651 Presence of right artificial knee joint: Secondary | ICD-10-CM | POA: Diagnosis not present

## 2017-11-13 DIAGNOSIS — M25562 Pain in left knee: Secondary | ICD-10-CM | POA: Diagnosis not present

## 2017-11-19 DIAGNOSIS — Z853 Personal history of malignant neoplasm of breast: Secondary | ICD-10-CM | POA: Insufficient documentation

## 2017-11-20 DIAGNOSIS — Z95828 Presence of other vascular implants and grafts: Secondary | ICD-10-CM | POA: Diagnosis not present

## 2017-11-20 DIAGNOSIS — R222 Localized swelling, mass and lump, trunk: Secondary | ICD-10-CM | POA: Insufficient documentation

## 2017-11-20 DIAGNOSIS — Z853 Personal history of malignant neoplasm of breast: Secondary | ICD-10-CM | POA: Diagnosis not present

## 2017-11-23 DIAGNOSIS — N641 Fat necrosis of breast: Secondary | ICD-10-CM | POA: Diagnosis not present

## 2017-11-23 DIAGNOSIS — R222 Localized swelling, mass and lump, trunk: Secondary | ICD-10-CM | POA: Diagnosis not present

## 2017-11-23 DIAGNOSIS — Z853 Personal history of malignant neoplasm of breast: Secondary | ICD-10-CM | POA: Diagnosis not present

## 2017-11-23 DIAGNOSIS — M7989 Other specified soft tissue disorders: Secondary | ICD-10-CM | POA: Diagnosis not present

## 2017-11-23 DIAGNOSIS — N6032 Fibrosclerosis of left breast: Secondary | ICD-10-CM | POA: Diagnosis not present

## 2017-11-23 DIAGNOSIS — N61 Mastitis without abscess: Secondary | ICD-10-CM | POA: Diagnosis not present

## 2017-11-27 DIAGNOSIS — Z95828 Presence of other vascular implants and grafts: Secondary | ICD-10-CM | POA: Diagnosis not present

## 2017-11-27 DIAGNOSIS — R222 Localized swelling, mass and lump, trunk: Secondary | ICD-10-CM | POA: Diagnosis not present

## 2017-12-01 DIAGNOSIS — Z452 Encounter for adjustment and management of vascular access device: Secondary | ICD-10-CM | POA: Diagnosis not present

## 2017-12-01 DIAGNOSIS — Z9012 Acquired absence of left breast and nipple: Secondary | ICD-10-CM | POA: Diagnosis not present

## 2017-12-01 DIAGNOSIS — Z853 Personal history of malignant neoplasm of breast: Secondary | ICD-10-CM | POA: Diagnosis not present

## 2018-01-30 DIAGNOSIS — Z853 Personal history of malignant neoplasm of breast: Secondary | ICD-10-CM | POA: Diagnosis not present

## 2018-01-30 DIAGNOSIS — M25561 Pain in right knee: Secondary | ICD-10-CM | POA: Diagnosis not present

## 2018-01-30 DIAGNOSIS — M170 Bilateral primary osteoarthritis of knee: Secondary | ICD-10-CM | POA: Diagnosis not present

## 2018-01-30 DIAGNOSIS — E119 Type 2 diabetes mellitus without complications: Secondary | ICD-10-CM | POA: Diagnosis not present

## 2018-01-30 DIAGNOSIS — G629 Polyneuropathy, unspecified: Secondary | ICD-10-CM | POA: Diagnosis not present

## 2018-01-30 DIAGNOSIS — Z13220 Encounter for screening for lipoid disorders: Secondary | ICD-10-CM | POA: Diagnosis not present

## 2018-01-30 DIAGNOSIS — G8929 Other chronic pain: Secondary | ICD-10-CM | POA: Diagnosis not present

## 2018-01-30 DIAGNOSIS — R030 Elevated blood-pressure reading, without diagnosis of hypertension: Secondary | ICD-10-CM | POA: Diagnosis not present

## 2018-01-30 DIAGNOSIS — M25562 Pain in left knee: Secondary | ICD-10-CM | POA: Diagnosis not present

## 2018-04-03 ENCOUNTER — Encounter: Payer: Self-pay | Admitting: Sports Medicine

## 2018-04-03 ENCOUNTER — Other Ambulatory Visit: Payer: Self-pay

## 2018-04-03 ENCOUNTER — Ambulatory Visit (INDEPENDENT_AMBULATORY_CARE_PROVIDER_SITE_OTHER): Payer: Medicare Other | Admitting: Sports Medicine

## 2018-04-03 DIAGNOSIS — M79674 Pain in right toe(s): Secondary | ICD-10-CM

## 2018-04-03 DIAGNOSIS — G629 Polyneuropathy, unspecified: Secondary | ICD-10-CM | POA: Insufficient documentation

## 2018-04-03 DIAGNOSIS — I739 Peripheral vascular disease, unspecified: Secondary | ICD-10-CM | POA: Diagnosis not present

## 2018-04-03 DIAGNOSIS — M79675 Pain in left toe(s): Secondary | ICD-10-CM | POA: Diagnosis not present

## 2018-04-03 DIAGNOSIS — B351 Tinea unguium: Secondary | ICD-10-CM | POA: Diagnosis not present

## 2018-04-03 DIAGNOSIS — E119 Type 2 diabetes mellitus without complications: Secondary | ICD-10-CM | POA: Diagnosis not present

## 2018-04-03 DIAGNOSIS — Z170 Estrogen receptor positive status [ER+]: Secondary | ICD-10-CM

## 2018-04-03 DIAGNOSIS — C50812 Malignant neoplasm of overlapping sites of left female breast: Secondary | ICD-10-CM | POA: Insufficient documentation

## 2018-04-03 NOTE — Progress Notes (Signed)
Subjective: Aramis Zobel is a 72 y.o. female patient with history of diabetes who presents to office today complaining of long,mildly painful nails  while ambulating in shoes; unable to trim. Patient states that the left 1st toenail is loose. Patient reports that she is no longer on medication for her diabetes and has burning pain to feet and legs that is worse at beds time. Patient also has a history of breast cancer and was on chemo and did a course of radiation.  Patient reports that the pain in her feet and legs became worse after treatment for breast cancer.  Patient also admits to swelling in feet and legs and chronic discoloration.  Patient denies any other pedal complaints at this time.  Review of Systems  Musculoskeletal: Positive for joint pain and myalgias.  Neurological: Positive for sensory change.  All other systems reviewed and are negative.   Patient Active Problem List   Diagnosis Date Noted  . Malignant neoplasm of overlapping sites of left breast in female, estrogen receptor positive ([place]) 04/03/2018  . Neuropathy 04/03/2018  . Mass of left chest wall 11/20/2017  . History of malignant neoplasm of breast 11/19/2017  . S/P TAVR (transcatheter aortic valve replacement) 08/09/2016  . Aortic valve stenosis 08/08/2016  . Breast calcification, left 06/07/2016  . Knee pain, bilateral 10/23/2014  . Osteoarthritis   . Chronic cervical pain 12/14/2012  . Arthralgia of shoulder region 04/18/2012  . Heart murmur, systolic 00/76/2263  . Diabetic peripheral neuropathy ([place]) 07/24/2011  . Chronic venous insufficiency 10/27/2010  . Onychomycosis 10/27/2010  . Type 2 diabetes with complication ([place]) 33/54/5625  . OBESITY, MORBID 05/05/2010  . NONSPEC ELEVATION OF LEVELS OF TRANSAMINASE/LDH 05/05/2010  . HTN (hypertension), benign 05/05/2010   Current Outpatient Medications on File Prior to Visit  Medication Sig Dispense Refill  . aspirin 81 MG tablet Take 81 mg by mouth daily.       . diclofenac (VOLTAREN) 75 MG EC tablet Take 1 tablet (75 mg total) by mouth 2 (two) times daily. 180 tablet 1  . furosemide (LASIX) 40 MG tablet Take 1 tablet (40 mg total) by mouth daily. 90 tablet 3  . irbesartan (AVAPRO) 150 MG tablet TAKE 1 TABLET DAILY FOR TREATMENT OF HIGH BLOOD PRESSURE 90 tablet 3  . metFORMIN (GLUCOPHAGE) 500 MG tablet Take 1 tablet (500 mg total) by mouth 2 (two) times daily with a meal. 180 tablet 3   No current facility-administered medications on file prior to visit.    Allergies  Allergen Reactions  . Penicillins     REACTION: neck/throat swelling  . Gabapentin Other (See Comments)    depression    No results found for this or any previous visit (from the past 2160 hour(s)).  Objective: General: Patient is awake, alert, and oriented x 3 and in no acute distress.  Integument: Skin is warm, dry and supple bilateral. Nails are tender, long, thickened and  dystrophic with subungual debris, consistent with onychomycosis, 1-5 bilateral with distal lifting at the left great toe nail and trauma lines consistent with microtrauma likely secondary to neuropathy. No signs of infection. No open lesions or preulcerative lesions present bilateral. Remaining integument unremarkable.  Vasculature:  Dorsalis Pedis pulse 0/4 bilateral. Posterior Tibial pulse  0/4 bilateral.  Capillary fill time <5 sec 1-5 bilateral.  No hair growth to the level of the digits. Temperature gradient within normal limits.  Moderate varicosities present bilateral.  1+ edema present bilateral.  Varicosities bilateral with hyperpigmentation bilateral.  Neurology:  The patient has diminished sensation measured with a 5.07/10g Semmes Weinstein Monofilament at all pedal sites bilateral . Vibratory sensation diminished bilateral with tuning fork. No Babinski sign present bilateral.   Musculoskeletal:  Asymptomatic pes planus and hammertoe pedal deformities noted bilateral. Muscular strength 4/5 in all  lower extremity muscular groups bilateral without pain on range of motion . No tenderness with calf compression bilateral.  Assessment and Plan: Problem List Items Addressed This Visit      Nervous and Auditory   Neuropathy    Other Visit Diagnoses    Encounter for diabetic foot exam ([place])    -  Primary   Pain due to onychomycosis of toenails of both feet       PVD (peripheral vascular disease) ([place])          -Examined patient. -Discussed and educated patient on diabetic foot care, especially with  regards to the vascular, neurological and musculoskeletal systems.  -Stressed the importance of good glycemic control and the detriment of not  controlling glucose levels in relation to the foot. -Mechanically debrided all nails 1-5 bilateral using sterile nail nipper and filed with dremel without incident  -Advised patient that likely her neuropathy is related to her history of diet-controlled diabetes and history of cancer after having chemo advised patient to consider using a topical nerve pain cream like Aspercreme or Biofreeze over-the-counter advised patient if this fails to offer relief then would recommend a referral to neurology for further recommendations since patient had side effects when she tried gabapentin in the past -Answered all patient questions -Patient to return  in 3 months for at risk foot care -Patient advised to call the office if any problems or questions arise in the meantime.  Landis Martins, DPM

## 2018-04-10 ENCOUNTER — Ambulatory Visit: Payer: Medicare PPO | Admitting: Family Medicine

## 2018-04-12 ENCOUNTER — Ambulatory Visit (INDEPENDENT_AMBULATORY_CARE_PROVIDER_SITE_OTHER): Payer: Medicare Other | Admitting: Family Medicine

## 2018-04-12 ENCOUNTER — Encounter: Payer: Self-pay | Admitting: Family Medicine

## 2018-04-12 ENCOUNTER — Telehealth: Payer: Self-pay | Admitting: Family Medicine

## 2018-04-12 VITALS — BP 153/83 | HR 74 | Temp 98.2°F | Resp 16 | Ht 64.0 in | Wt 255.0 lb

## 2018-04-12 DIAGNOSIS — I872 Venous insufficiency (chronic) (peripheral): Secondary | ICD-10-CM | POA: Diagnosis not present

## 2018-04-12 DIAGNOSIS — Z953 Presence of xenogenic heart valve: Secondary | ICD-10-CM

## 2018-04-12 DIAGNOSIS — Z853 Personal history of malignant neoplasm of breast: Secondary | ICD-10-CM

## 2018-04-12 DIAGNOSIS — I10 Essential (primary) hypertension: Secondary | ICD-10-CM

## 2018-04-12 DIAGNOSIS — Z96653 Presence of artificial knee joint, bilateral: Secondary | ICD-10-CM | POA: Diagnosis not present

## 2018-04-12 DIAGNOSIS — E1149 Type 2 diabetes mellitus with other diabetic neurological complication: Secondary | ICD-10-CM

## 2018-04-12 NOTE — Telephone Encounter (Signed)
I will order referral to oncology, but she is already an established patient with Dr. Aline Brochure in cardiology there, so she can call his office herself and arrange appt. As far as coordinating oncology and cardiology appt, she has to do that herself by working with the schedulers for the specialist's offices.-thx

## 2018-04-12 NOTE — Telephone Encounter (Signed)
Copied from [place] [id number]. Topic: Quick Communication - See Telephone Encounter >> Apr 12, 2018  4:37 PM Blase Mess A wrote: CRM for notification. See Telephone encounter for: 04/12/18.  Patient is requesting referral to oncology at [facility] Please advise. Patient is requesting the appt to Dr. Aline Brochure cardiology and oncology on the same day. Please advise. Maybe an hour and half apart. Please advise [phone]

## 2018-04-12 NOTE — Progress Notes (Signed)
Office Note 04/12/2018  CC:  Chief Complaint  Patient presents with  . Follow-up    RCI    HPI:  Bonnie Kane is a 72 y.o. White female who is here to re-establish care after having lived in [place], [place] for a couple years.  F/u HTN, DM, chronic LE venous insuff edema. Patient's most recent primary MD: ? Getting old records. Old records in EPIC/HL were reviewed prior to or during today's visit. I last saw her about 2 yrs ago and in that time she was dx'd with left breast cancer and has had a transcatheter aortic valve replacement and has had bilat TKA !  Chemo has made her LE bilat painful and numbness neuropathy much worse, mainly in evenings: Takes tylenol with codeine (she can't recall strength) hs and this calms the pain down and she can sleep.  Gabapentin caused intolerable side effects. Glucoses : checks irregularly, was 185.  She still resists exercising.  Plans to restart dieting 04/2018. BP's at home usually 120s/60s.    R knee without pain. L knee has peri-articular pain. LLs hurt diffusely from halfway down calves into feet in stocking distribution--describes tenderness that is chronic.  Recently went to podiatrist and was informed this is due to her varicose vein dz.  She denies pitting edema lately.    Past Medical History:  Diagnosis Date  . Diabetes mellitus    Type 2  . Heart murmur, systolic 17/0017   Never could get pt to get ECHO arranged in her hometown [place], [place]).  Finally, when she was dx'd with breast cancer 06/2016, she had to get echo and it showed severe AS and she subsequently got TAVR.  Marland Kitchen History of left breast cancer 06/17/2016   Invasive ductal carcinoma-->Left mastectomy with axillary lymph node dissection, +radiation,+chemo (all done in [place], [place]).  Marland Kitchen HTN (hypertension), benign   . Hx of total knee replacement, bilateral 2018  . Hypertriglyceridemia   . Obesity   . Osteoarthritis    low back, hips, knees  . S/P TAVR (transcatheter aortic  valve replacement) 07/2016   [facility]  . Transaminasemia mild 4/11   hep panel- neg    Past Surgical History:  Procedure Laterality Date  . APPENDECTOMY  1986  . MASTECTOMY WITH AXILLARY LYMPH NODE DISSECTION  06/2016   [place], [place]  . REPLACEMENT TOTAL KNEE BILATERAL  2018/19   [place] ABDOMINAL HYSTERECTOMY  1986  . TRANSCATHETER AORTIC VALVE REPLACEMENT, TRANSFEMORAL  07/2016   [facility]    Family History  Problem Relation Age of Onset  . Other Brother        Dysrythmia  . Other Mother        CHF  . Diabetes Mother        Type 2  . Other Father        CHF  . Celiac disease Sister   . Alcohol abuse Brother   . Multiple sclerosis Brother     Social History   Socioeconomic History  . Marital status: Married    Spouse name: Not on file  . Number of children: Not on file  . Years of education: Not on file  . Highest education level: Not on file  Occupational History  . Not on file  Social Needs  . Financial resource strain: Not on file  . Food insecurity:    Worry: Not on file    Inability: Not on file  . Transportation needs:    Medical: Not on  file    Non-medical: Not on file  Tobacco Use  . Smoking status: Former Smoker    Last attempt to quit: 04/18/1977    Years since quitting: 41.0  . Smokeless tobacco: Never Used  Substance and Sexual Activity  . Alcohol use: No  . Drug use: No  . Sexual activity: Not on file  Lifestyle  . Physical activity:    Days per week: Not on file    Minutes per session: Not on file  . Stress: Not on file  Relationships  . Social connections:    Talks on phone: Not on file    Gets together: Not on file    Attends religious service: Not on file    Active member of club or organization: Not on file    Attends meetings of clubs or organizations: Not on file    Relationship status: Not on file  . Intimate partner violence:    Fear of current or ex partner: Not on file    Emotionally abused: Not on file     Physically abused: Not on file    Forced sexual activity: Not on file  Other Topics Concern  . Not on file  Social History Narrative   Married, no children.   Lives with Husband Herb in [place], [place] as of 03/2018 (after having spent 2 yrs living in [place], [place]).   Active in [employer].  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   No T/A/Ds.    Outpatient Encounter Medications as of 04/12/2018  Medication Sig  . acetaminophen (TYLENOL) 325 MG tablet Take by mouth.  Marland Kitchen acetaminophen-codeine (TYLENOL #3) 300-30 MG tablet Take by mouth.  . furosemide (LASIX) 40 MG tablet Take 1 tablet (40 mg total) by mouth daily.  . [DISCONTINUED] amLODipine (NORVASC) 5 MG tablet Take by mouth.  . [DISCONTINUED] aspirin 81 MG tablet Take 81 mg by mouth daily.    . [DISCONTINUED] celecoxib (CELEBREX) 200 MG capsule Take by mouth.  . [DISCONTINUED] diclofenac (VOLTAREN) 75 MG EC tablet Take 1 tablet (75 mg total) by mouth 2 (two) times daily. (Patient not taking: Reported on 04/12/2018)  . [DISCONTINUED] diclofenac sodium (VOLTAREN) 1 % GEL Apply topically.  . [DISCONTINUED] gabapentin (NEURONTIN) 100 MG capsule Take by mouth.  . [DISCONTINUED] irbesartan (AVAPRO) 150 MG tablet TAKE 1 TABLET DAILY FOR TREATMENT OF HIGH BLOOD PRESSURE (Patient not taking: Reported on 04/12/2018)  . [DISCONTINUED] metFORMIN (GLUCOPHAGE) 500 MG tablet Take 1 tablet (500 mg total) by mouth 2 (two) times daily with a meal. (Patient not taking: Reported on 04/12/2018)  . [DISCONTINUED] omega-3 acid ethyl esters (LOVAZA) 1 g capsule Take by mouth.   No facility-administered encounter medications on file as of 04/12/2018.     Allergies  Allergen Reactions  . Penicillins     REACTION: neck/throat swelling  . Gabapentin Other (See Comments)    depression    ROS Review of Systems  Constitutional: Negative for fatigue and fever.  HENT: Negative for congestion and sore throat.   Eyes: Negative for visual disturbance.   Respiratory: Negative for cough.   Cardiovascular: Negative for chest pain.  Gastrointestinal: Negative for abdominal pain and nausea.  Genitourinary: Negative for dysuria.  Musculoskeletal: Negative for back pain and joint swelling.  Skin: Negative for rash.  Neurological: Negative for weakness and headaches.  Hematological: Negative for adenopathy.    PE; Blood pressure (!) 153/83, pulse 74, temperature 98.2 F (36.8 C), temperature source Oral, resp. rate 16, height  5\' 4"  (1.626 m), weight 255 lb (115.7 kg), SpO2 96 %. Body mass index is 43.77 kg/m.  Gen: Alert, well appearing.  Patient is oriented to person, place, time, and situation. AFFECT: pleasant, lucid thought and speech. VVO:HYWV: no injection, icteris, swelling, or exudate.  EOMI, PERRLA. Mouth: lips without lesion/swelling.  Oral mucosa pink and moist. Oropharynx without erythema, exudate, or swelling.  Neck - No masses or thyromegaly or limitation in range of motion CV: RRR, no m/r/g.   LUNGS: CTA bilat, nonlabored resps, good aeration in all lung fields. Abd: soft, NT/ND EXT: no clubbing or cyanosis.  She has nonpitting edema in both LL's and feet, with many scattered varicose veins that are non-inflamed but LL's diffusely TTP. Skin: no rash, no pallor, no jaundice  Pertinent labs:  No results found for: TSH No results found for: WBC, HGB, HCT, MCV, PLT Lab Results  Component Value Date   CREATININE 0.94 03/02/2016   BUN 25 (H) 03/02/2016   NA 140 03/02/2016   K 4.7 03/02/2016   CL 103 03/02/2016   CO2 27 03/02/2016   Lab Results  Component Value Date   ALT 14 09/09/2015   AST 15 09/09/2015   ALKPHOS 80 09/09/2015   BILITOT 0.5 09/09/2015   Lab Results  Component Value Date   CHOL 164 09/09/2015   Lab Results  Component Value Date   HDL 36.10 (L) 09/09/2015   Lab Results  Component Value Date   LDLCALC 97 09/09/2015   Lab Results  Component Value Date   TRIG 154.0 (H) 09/09/2015   Lab  Results  Component Value Date   CHOLHDL 5 09/09/2015   Lab Results  Component Value Date   HGBA1C 6.2 03/02/2016    ASSESSMENT AND PLAN:   Re-establishing pt-->a lot has happened in the 2 yrs since I last saw her.  1) Left breast cancer: s/p surgery, rad, chemo. She wants to be referred to [facility] oncology to be followed for this now. Referral ordered.  2) Severe aortic stenosis, now she is s/p TAVR: per pt report she still follows up with Dr. Aline Brochure at [facility] where the procedure was done, and pt reports last echo was about 2 mo ago.    3) Hx of bilat TKA: R knee feeling w/out pain now, L knee with some mild periarticular soft tissue pain, particularly with ambulation.    4) DM 2: hx of good control.  Pt reports that her most recent A1c about 2-3 mo ago was 7.1%, which is fine.  She prefers to skip labs at this time and get them at next f/u here in 3 mo.  5) HTN: good control per pt report of home bp monitoring. Pt states electrolytes and sCr normal about 2-3 mo ago.  Will get records.  6) Diabetic PN: worse since her chemo tx for her breast ca. Low dose tylenol with codeine helpful--takes it only in evening.  She'll call me with the strength of tab and I'll go ahead and rx more of this for her.  She's intolerant of gabapentin.  7) Lower extremity venous insufficiency with varicose vein dz bilat: she is doing fairly well with this currently.  No pitting edema.  I believe she still takes lasix 40mg  every day. Encouraged low Na diet, increase exercise/walking.  An After Visit Summary was printed and given to the patient.  Return in about 3 months (around 07/12/2018) for routine chronic illness f/u.  Signed:  Crissie Sickles, MD  04/12/2018    

## 2018-04-13 ENCOUNTER — Telehealth: Payer: Self-pay | Admitting: Family Medicine

## 2018-04-13 MED ORDER — ACETAMINOPHEN-CODEINE #3 300-30 MG PO TABS: 1.0000 | ORAL_TABLET | Freq: Every evening | ORAL | 0 refills | Status: DC | PRN

## 2018-04-13 NOTE — Telephone Encounter (Signed)
Copied from [place] [id number]. Topic: General - Other >> Apr 12, 2018  4:55 PM Valla Leaver wrote: Reason for CRM: Dr. Anitra Lauth asked the patient to tell him the strenth of her script. It's  Tylenol w/ codein 300-30

## 2018-04-13 NOTE — Telephone Encounter (Signed)
Pt advised and voiced understanding.   

## 2018-04-13 NOTE — Telephone Encounter (Signed)
OK, eRx sent to [facility] in [place].

## 2018-04-18 DIAGNOSIS — K760 Fatty (change of) liver, not elsewhere classified: Secondary | ICD-10-CM

## 2018-04-18 HISTORY — DX: Fatty (change of) liver, not elsewhere classified: K76.0

## 2018-05-02 ENCOUNTER — Ambulatory Visit: Payer: Medicare PPO | Admitting: Family Medicine

## 2018-07-10 ENCOUNTER — Telehealth: Payer: Self-pay | Admitting: Family Medicine

## 2018-07-10 MED ORDER — ZOLPIDEM TARTRATE 10 MG PO TABS: 10.0000 mg | ORAL_TABLET | Freq: Every evening | ORAL | 1 refills | Status: DC | PRN

## 2018-07-10 NOTE — Telephone Encounter (Signed)
Pt's husband called to request Lorrin Mais for his wife Limb). He put [place] on the phone for me and I clarified that her diabetic neuropathy burning pain in feet impairs sleep.  Taking 2 tylenol hs helps her initiate sleep but she wakes up a few hours later hurting and cannot sleep much at all.  She had an [place] 10mg  leftover from the past and took it and says she slept completely through the night and feels no hangover effect, had no bad dreams/parasomnias, etc. I agreed to send in rx for ambien 10mg , 1 qhs, #30, with one additional RF. After the current COVID-19 quarantine recommendations are lifted, she'll f/u in office to discuss.  Signed:  Crissie Sickles, MD           07/10/2018

## 2018-07-13 ENCOUNTER — Ambulatory Visit: Payer: Medicare Other | Admitting: Family Medicine

## 2018-07-19 ENCOUNTER — Ambulatory Visit: Payer: Medicare Other | Admitting: Family Medicine

## 2018-09-11 ENCOUNTER — Telehealth: Payer: Self-pay | Admitting: Family Medicine

## 2018-09-11 NOTE — Telephone Encounter (Signed)
RF request for Zolpidem LOV: 04/12/18 f/u [facility]ci Next ov: advised to f/u 3 months Last written: 07/10/18 (30,1) NO CSC or UDS.  PMP aware printed. Medication pending, please advise.

## 2018-09-12 NOTE — Telephone Encounter (Signed)
Patient calling regarding to see if refill was sent in to pharmacy

## 2018-09-12 NOTE — Telephone Encounter (Signed)
Pt notified medication sent.

## 2018-10-04 ENCOUNTER — Telehealth: Payer: Self-pay | Admitting: Family Medicine

## 2018-10-04 NOTE — Telephone Encounter (Signed)
Patient is requesting medication refill for  zolpidem (AMBIEN) 10 MG. Patient would like a 90 day supply due to insurance purposes. It is cheaper to do a 90 day supply  Instead on a 30 day.  New pharmacy: [facility]: [place], [place] [address] Phone: [phone]

## 2018-10-05 MED ORDER — ZOLPIDEM TARTRATE 10 MG PO TABS: ORAL_TABLET | ORAL | 0 refills | Status: DC

## 2018-10-05 NOTE — Telephone Encounter (Signed)
I'll do 90d supply w/out RF. Pt must have office f/u before running out of this rx.-thx

## 2018-10-05 NOTE — Telephone Encounter (Signed)
RF request for Zolpidem LOV: 04/12/18, f/u [facility] Next ov: advised to f/u 3 mo Last written: 09/11/18(30,1) No CSC or UDS  Request for 90 d supply instead of 30. New pharmacy [facility]. Please advise, thanks.

## 2018-10-09 NOTE — Telephone Encounter (Signed)
Pt was advised medication was sent and will need to schedule f/u before med runs out.

## 2018-10-25 DIAGNOSIS — I5022 Chronic systolic (congestive) heart failure: Secondary | ICD-10-CM | POA: Diagnosis not present

## 2018-10-25 DIAGNOSIS — Z853 Personal history of malignant neoplasm of breast: Secondary | ICD-10-CM | POA: Diagnosis not present

## 2018-10-25 DIAGNOSIS — I350 Nonrheumatic aortic (valve) stenosis: Secondary | ICD-10-CM | POA: Diagnosis not present

## 2018-10-25 DIAGNOSIS — Z48812 Encounter for surgical aftercare following surgery on the circulatory system: Secondary | ICD-10-CM | POA: Diagnosis not present

## 2018-10-25 DIAGNOSIS — Z79899 Other long term (current) drug therapy: Secondary | ICD-10-CM | POA: Diagnosis not present

## 2018-10-25 DIAGNOSIS — Z952 Presence of prosthetic heart valve: Secondary | ICD-10-CM | POA: Diagnosis not present

## 2018-12-05 ENCOUNTER — Other Ambulatory Visit: Payer: Self-pay

## 2018-12-05 ENCOUNTER — Encounter: Payer: Self-pay | Admitting: Family Medicine

## 2018-12-05 ENCOUNTER — Ambulatory Visit (INDEPENDENT_AMBULATORY_CARE_PROVIDER_SITE_OTHER): Payer: Medicare Other | Admitting: Family Medicine

## 2018-12-05 VITALS — BP 139/60 | HR 73 | Temp 97.9°F | Wt 236.0 lb

## 2018-12-05 DIAGNOSIS — G6289 Other specified polyneuropathies: Secondary | ICD-10-CM | POA: Diagnosis not present

## 2018-12-05 DIAGNOSIS — I10 Essential (primary) hypertension: Secondary | ICD-10-CM | POA: Diagnosis not present

## 2018-12-05 DIAGNOSIS — F5101 Primary insomnia: Secondary | ICD-10-CM

## 2018-12-05 DIAGNOSIS — Z853 Personal history of malignant neoplasm of breast: Secondary | ICD-10-CM

## 2018-12-05 DIAGNOSIS — I872 Venous insufficiency (chronic) (peripheral): Secondary | ICD-10-CM

## 2018-12-05 DIAGNOSIS — E118 Type 2 diabetes mellitus with unspecified complications: Secondary | ICD-10-CM

## 2018-12-05 DIAGNOSIS — Z952 Presence of prosthetic heart valve: Secondary | ICD-10-CM | POA: Diagnosis not present

## 2018-12-05 MED ORDER — FUROSEMIDE 40 MG PO TABS: 40.0000 mg | ORAL_TABLET | Freq: Every day | ORAL | 1 refills | Status: DC

## 2018-12-05 MED ORDER — ZOLPIDEM TARTRATE 10 MG PO TABS: ORAL_TABLET | ORAL | 1 refills | Status: DC

## 2018-12-05 NOTE — Progress Notes (Signed)
Virtual Visit via Video Note  I connected with pton 12/05/18 at 11:30 AM EDT by a video enabled telemedicine application and verified that I am speaking with the correct person using two identifiers.  Location patient: home Location provider:work or home office Persons participating in the virtual visit: patient, provider  I discussed the limitations of evaluation and management by telemedicine and the availability of in person appointments. The patient expressed understanding and agreed to proceed.  Telemedicine visit is a necessity given the COVID-19 restrictions in place at the current time.  HPI: 73 y/o WF being seen today for f/u DM 2, HTN, and LE venous insufficiency edema + varicose vein dz. I last saw her about 8 mo ago.  HTN: home bp's have been consistently <130/80.  Recent HbA1c at [facility] cardiology a couple of weeks ago was 7.2%.  Unsure what other labs were done, no records available at this time. Has gone back on her wt watchers diet and has lost some wt now.  Still takes metformin "sometimes, when my sugar is real high".  This is classic for her despite my instructions in the past about correct way to take meds.  Has 01/09/19 appt for echocardiogram at [facility]. Has appt with vascular MD same day to be evaluated for her LE varicose vein dz. Getting some LE fluid vesicles.  Was not taking lasix daily but has started to lately and knows to stay on it daily. Has not been limiting Na and has not been elevating legs.  She can't tolerate compression stockings due to her DPN.  Insomnia: takes ambien nightly and this helps her A LOT b/c she can sleep AND her feet don't bother her. She takes a tylenol tab with the ambien nightly (no tylenol w/codeine anymore).  She states she never got contacted by anyone with [facility] oncology about getting established after I made referral last time she was here (for breast ca follow up/surveillance).  ROS: no CP, no SOB, no wheezing, no cough, no  dizziness, no HAs, no rashes, no melena/hematochezia.  No polyuria or polydipsia.  No myalgias or arthralgias.   Past Medical History:  Diagnosis Date  . Diabetes mellitus    Type 2  . Heart murmur, systolic [id number]/5056   Never could get pt to get ECHO arranged in her hometown [place], [place]  Finally, when she was dx'd with breast cancer 06/2016, she had to get echo and it showed severe AS and she subsequently got TAVR.  Marland Kitchen History of left breast cancer 06/17/2016   Invasive ductal carcinoma-->Left mastectomy with axillary lymph node dissection, +radiation,+chemo (all done in [place], [place]).  Marland Kitchen HTN (hypertension), benign   . Hx of total knee replacement, bilateral 2018  . Hypertriglyceridemia   . Obesity   . Osteoarthritis    low back, hips, knees  . S/P TAVR (transcatheter aortic valve replacement) 07/2016   [facility]  . Transaminasemia mild 4/11   hep panel- neg    Past Surgical History:  Procedure Laterality Date  . APPENDECTOMY  1986  . MASTECTOMY WITH AXILLARY LYMPH NODE DISSECTION  06/2016   [place], [place]  . REPLACEMENT TOTAL KNEE BILATERAL  2018/19   [place], [place] ABDOMINAL HYSTERECTOMY  1986  . TRANSCATHETER AORTIC VALVE REPLACEMENT, TRANSFEMORAL  07/2016   [facility].  (Dr. Aline Brochure?)  Pt states she got f/u echo approx 01/2018.    Family History  Problem Relation Age of Onset  . Other Brother        Dysrythmia  .  Other Mother        CHF  . Diabetes Mother        Type 2  . Other Father        CHF  . Celiac disease Sister   . Alcohol abuse Brother   . Multiple sclerosis Brother      Current Outpatient Medications:  .  acetaminophen-codeine (TYLENOL #3) 300-30 MG tablet, Take 1 tablet by mouth at bedtime as needed for moderate pain., Disp: 90 tablet, Rfl: 0 .  ASPIRIN 81 PO, Take 81 mg by mouth., Disp: , Rfl:  .  zolpidem (AMBIEN) 10 MG tablet, TAKE 1 TABLET BY MOUTH ONCE DAILY AT BEDTIME AS NEEDED FOR SLEEP, Disp: 90 tablet, Rfl: 0 .  acetaminophen (TYLENOL) 325 MG  tablet, Take by mouth., Disp: , Rfl:  .  clindamycin (CLEOCIN) 300 MG capsule, Take 2 capsules 1 hour prior to dental work, Disp: , Rfl:  .  furosemide (LASIX) 40 MG tablet, Take 1 tablet (40 mg total) by mouth daily. (Patient not taking: Reported on 12/05/2018), Disp: 90 tablet, Rfl: 3  EXAM:  VITALS per patient if applicable: BP 614/43 (BP Location: Left Arm, Patient Position: Sitting, Cuff Size: Large)   Pulse 73   Temp 97.9 F (36.6 C) (Oral)   Wt 236 lb (107 kg)   BMI 40.51 kg/m    GENERAL: alert, oriented, appears well and in no acute distress  HEENT: atraumatic, conjunttiva clear, no obvious abnormalities on inspection of external nose and ears  NECK: normal movements of the head and neck  LUNGS: on inspection no signs of respiratory distress, breathing rate appears normal, no obvious gross SOB, gasping or wheezing  CV: no obvious cyanosis  MS: moves all visible extremities without noticeable abnormality  PSYCH/NEURO: pleasant and cooperative, no obvious depression or anxiety, speech and thought processing grossly intact  LABS: none today  No results found for: TSH No results found for: WBC, HGB, HCT, MCV, PLT Lab Results  Component Value Date   CREATININE 0.94 03/02/2016   BUN 25 (H) 03/02/2016   NA 140 03/02/2016   K 4.7 03/02/2016   CL 103 03/02/2016   CO2 27 03/02/2016   Lab Results  Component Value Date   ALT 14 09/09/2015   AST 15 09/09/2015   ALKPHOS 80 09/09/2015   BILITOT 0.5 09/09/2015   Lab Results  Component Value Date   CHOL 164 09/09/2015   Lab Results  Component Value Date   HDL 36.10 (L) 09/09/2015   Lab Results  Component Value Date   LDLCALC 97 09/09/2015   Lab Results  Component Value Date   TRIG 154.0 (H) 09/09/2015   Lab Results  Component Value Date   CHOLHDL 5 09/09/2015   Lab Results  Component Value Date   HGBA1C 6.2 03/02/2016    ASSESSMENT AND PLAN:  Discussed the following assessment and plan:  1) DM 2,  control fair. She will continue working on diet/wt loss and per her preference will use metformin only on a prn basis. Will get [place] cardiology records that have recent lab results. Contact us in Nov this year to set up lab appt to repeat labs.  2) HTN: The current medical regimen is effective;  continue present plan and medications. No meds at this time.  3) Insomnia and DPN pain: does well with taking ambien and tylenol nightly. RF'd ambien 10mg , 1 qhs, #90, RF x 1.  4) LE venous insufficiency edema: with vesicle formation per her report.  Discussed importance of Na limitation and elevation of legs daily. Continue 40mg  lasix daily but if vesicle formation frequent despite diet modification and elevation then will have to increase dose.  Signs/symptoms to call or return for were reviewed and pt expressed understanding.  5) PAD: unknown hx but from pt's report it sounds like her cardiologist suspects this b/c he has referred her to a vascular surgeon at [place]: appt 01/09/19.  6) Hx of TAVR: cardiology f/u at [facility] a few weeks ago per pt was good. Has echo scheduled for 01/09/19.  Will get records.  7) Hx of left breast cancer.  Needs oncology surveillance. Referred her back in Dec 2019 but she doesn't recall anything about this. Will order referral again: [facility] oncology as per pt preference/request.    I discussed the assessment and treatment plan with the patient. The patient was provided an opportunity to ask questions and all were answered. The patient agreed with the plan and demonstrated an understanding of the instructions.   The patient was advised to call back or seek an in-person evaluation if the symptoms worsen or if the condition fails to improve as anticipated.  F/u: 6 mo o/v, 3 mo labs.  Signed:  Crissie Sickles, MD           12/05/2018

## 2019-01-09 DIAGNOSIS — Z170 Estrogen receptor positive status [ER+]: Secondary | ICD-10-CM | POA: Diagnosis not present

## 2019-01-09 DIAGNOSIS — C50812 Malignant neoplasm of overlapping sites of left female breast: Secondary | ICD-10-CM | POA: Diagnosis not present

## 2019-01-09 DIAGNOSIS — Z87891 Personal history of nicotine dependence: Secondary | ICD-10-CM | POA: Diagnosis not present

## 2019-01-09 DIAGNOSIS — I83893 Varicose veins of bilateral lower extremities with other complications: Secondary | ICD-10-CM | POA: Diagnosis not present

## 2019-01-09 DIAGNOSIS — I8393 Asymptomatic varicose veins of bilateral lower extremities: Secondary | ICD-10-CM | POA: Insufficient documentation

## 2019-01-09 DIAGNOSIS — M79604 Pain in right leg: Secondary | ICD-10-CM | POA: Diagnosis not present

## 2019-01-09 DIAGNOSIS — Z952 Presence of prosthetic heart valve: Secondary | ICD-10-CM | POA: Diagnosis not present

## 2019-01-09 DIAGNOSIS — I872 Venous insufficiency (chronic) (peripheral): Secondary | ICD-10-CM | POA: Diagnosis not present

## 2019-01-09 DIAGNOSIS — I739 Peripheral vascular disease, unspecified: Secondary | ICD-10-CM | POA: Diagnosis not present

## 2019-01-09 DIAGNOSIS — M7989 Other specified soft tissue disorders: Secondary | ICD-10-CM | POA: Diagnosis not present

## 2019-01-09 DIAGNOSIS — L989 Disorder of the skin and subcutaneous tissue, unspecified: Secondary | ICD-10-CM | POA: Diagnosis not present

## 2019-01-09 DIAGNOSIS — Z9012 Acquired absence of left breast and nipple: Secondary | ICD-10-CM | POA: Diagnosis not present

## 2019-01-09 DIAGNOSIS — M79605 Pain in left leg: Secondary | ICD-10-CM | POA: Diagnosis not present

## 2019-01-09 DIAGNOSIS — R0602 Shortness of breath: Secondary | ICD-10-CM | POA: Diagnosis not present

## 2019-01-09 DIAGNOSIS — Z90721 Acquired absence of ovaries, unilateral: Secondary | ICD-10-CM | POA: Diagnosis not present

## 2019-01-09 DIAGNOSIS — R200 Anesthesia of skin: Secondary | ICD-10-CM | POA: Diagnosis not present

## 2019-01-17 DIAGNOSIS — I2510 Atherosclerotic heart disease of native coronary artery without angina pectoris: Secondary | ICD-10-CM

## 2019-01-17 DIAGNOSIS — U071 COVID-19: Secondary | ICD-10-CM

## 2019-01-17 DIAGNOSIS — I5042 Chronic combined systolic (congestive) and diastolic (congestive) heart failure: Secondary | ICD-10-CM

## 2019-01-17 HISTORY — DX: COVID-19: U07.1

## 2019-01-17 HISTORY — PX: TRANSTHORACIC ECHOCARDIOGRAM: SHX275

## 2019-01-17 HISTORY — DX: Chronic combined systolic (congestive) and diastolic (congestive) heart failure: I50.42

## 2019-01-17 HISTORY — DX: Atherosclerotic heart disease of native coronary artery without angina pectoris: I25.10

## 2019-01-23 ENCOUNTER — Telehealth: Payer: Self-pay | Admitting: Family Medicine

## 2019-01-23 NOTE — Telephone Encounter (Signed)
Patient's spouse called. Patient started running 103 temperature 01/21/19. Patient is sleeping a lot. Patient's husband is also sick he started with chills 01/19/19. Patient's husband is requesting call back.

## 2019-01-23 NOTE — Telephone Encounter (Signed)
Patient's daughter called back. She is very concerned about patient. Dr. Anitra Lauth advised that patient go to ER due to COVID symptoms.

## 2019-01-26 DIAGNOSIS — R05 Cough: Secondary | ICD-10-CM | POA: Diagnosis not present

## 2019-01-26 DIAGNOSIS — U071 COVID-19: Secondary | ICD-10-CM | POA: Diagnosis not present

## 2019-01-26 DIAGNOSIS — I350 Nonrheumatic aortic (valve) stenosis: Secondary | ICD-10-CM | POA: Diagnosis present

## 2019-01-26 DIAGNOSIS — H6593 Unspecified nonsuppurative otitis media, bilateral: Secondary | ICD-10-CM | POA: Diagnosis not present

## 2019-01-26 DIAGNOSIS — Z952 Presence of prosthetic heart valve: Secondary | ICD-10-CM | POA: Diagnosis not present

## 2019-01-26 DIAGNOSIS — R0602 Shortness of breath: Secondary | ICD-10-CM | POA: Diagnosis not present

## 2019-01-26 DIAGNOSIS — E785 Hyperlipidemia, unspecified: Secondary | ICD-10-CM | POA: Diagnosis not present

## 2019-01-26 DIAGNOSIS — J1289 Other viral pneumonia: Secondary | ICD-10-CM | POA: Diagnosis present

## 2019-01-26 DIAGNOSIS — A4189 Other specified sepsis: Secondary | ICD-10-CM | POA: Diagnosis not present

## 2019-01-26 DIAGNOSIS — I081 Rheumatic disorders of both mitral and tricuspid valves: Secondary | ICD-10-CM | POA: Diagnosis not present

## 2019-01-26 DIAGNOSIS — R5383 Other fatigue: Secondary | ICD-10-CM | POA: Diagnosis not present

## 2019-01-26 DIAGNOSIS — Z9221 Personal history of antineoplastic chemotherapy: Secondary | ICD-10-CM | POA: Diagnosis not present

## 2019-01-26 DIAGNOSIS — I248 Other forms of acute ischemic heart disease: Secondary | ICD-10-CM | POA: Diagnosis not present

## 2019-01-26 DIAGNOSIS — R112 Nausea with vomiting, unspecified: Secondary | ICD-10-CM | POA: Diagnosis not present

## 2019-01-26 DIAGNOSIS — Z8679 Personal history of other diseases of the circulatory system: Secondary | ICD-10-CM | POA: Insufficient documentation

## 2019-01-26 DIAGNOSIS — R509 Fever, unspecified: Secondary | ICD-10-CM | POA: Diagnosis not present

## 2019-01-26 DIAGNOSIS — J9601 Acute respiratory failure with hypoxia: Secondary | ICD-10-CM | POA: Diagnosis not present

## 2019-01-26 DIAGNOSIS — I10 Essential (primary) hypertension: Secondary | ICD-10-CM | POA: Diagnosis present

## 2019-01-26 DIAGNOSIS — J189 Pneumonia, unspecified organism: Secondary | ICD-10-CM | POA: Insufficient documentation

## 2019-01-26 DIAGNOSIS — E1142 Type 2 diabetes mellitus with diabetic polyneuropathy: Secondary | ICD-10-CM | POA: Diagnosis not present

## 2019-01-26 DIAGNOSIS — E1165 Type 2 diabetes mellitus with hyperglycemia: Secondary | ICD-10-CM | POA: Diagnosis present

## 2019-01-26 DIAGNOSIS — Z853 Personal history of malignant neoplasm of breast: Secondary | ICD-10-CM | POA: Diagnosis not present

## 2019-01-26 DIAGNOSIS — R7989 Other specified abnormal findings of blood chemistry: Secondary | ICD-10-CM | POA: Insufficient documentation

## 2019-01-26 DIAGNOSIS — I444 Left anterior fascicular block: Secondary | ICD-10-CM | POA: Diagnosis not present

## 2019-01-26 DIAGNOSIS — E119 Type 2 diabetes mellitus without complications: Secondary | ICD-10-CM | POA: Diagnosis not present

## 2019-01-26 DIAGNOSIS — Z20828 Contact with and (suspected) exposure to other viral communicable diseases: Secondary | ICD-10-CM | POA: Diagnosis not present

## 2019-01-26 LAB — CBC AND DIFFERENTIAL
HCT: 38 (ref 36–46)
Hemoglobin: 12.6 (ref 12.0–16.0)
Platelets: 141 — AB (ref 150–399)
WBC: 5.9

## 2019-01-26 LAB — BASIC METABOLIC PANEL
BUN: 26 — AB (ref 4–21)
Creatinine: 1 (ref 0.5–1.1)
Glucose: 101
Potassium: 4 (ref 3.4–5.3)
Sodium: 135 — AB (ref 137–147)

## 2019-01-26 LAB — HEPATIC FUNCTION PANEL
ALT: 17 (ref 7–35)
AST: 29 (ref 13–35)
Alkaline Phosphatase: 51 (ref 25–125)
Bilirubin, Direct: 6.2 — AB (ref 0.01–0.4)

## 2019-01-26 LAB — LIPID PANEL
HDL: 23 — AB (ref 35–70)
LDL Cholesterol: 46
Triglycerides: 92 (ref 40–160)

## 2019-01-26 LAB — IRON,TIBC AND FERRITIN PANEL: Ferritin: 391.9

## 2019-01-27 LAB — CBC AND DIFFERENTIAL
HCT: 37 (ref 36–46)
Hemoglobin: 12.2 (ref 12.0–16.0)
Platelets: 122 — AB (ref 150–399)
WBC: 3.9

## 2019-01-27 LAB — BASIC METABOLIC PANEL
BUN: 23 — AB (ref 4–21)
Creatinine: 0.8 (ref 0.5–1.1)
Glucose: 113
Potassium: 4.3 (ref 3.4–5.3)
Sodium: 136 — AB (ref 137–147)

## 2019-01-28 LAB — BASIC METABOLIC PANEL
BUN: 22 — AB (ref 4–21)
Creatinine: 0.9 (ref 0.5–1.1)
Glucose: 132
Potassium: 4.4 (ref 3.4–5.3)

## 2019-01-28 LAB — CBC AND DIFFERENTIAL
HCT: 36 (ref 36–46)
Hemoglobin: 12.3 (ref 12.0–16.0)
Platelets: 147 — AB (ref 150–399)
WBC: 7.2

## 2019-01-29 DIAGNOSIS — U071 COVID-19: Secondary | ICD-10-CM | POA: Insufficient documentation

## 2019-01-29 DIAGNOSIS — Z72821 Inadequate sleep hygiene: Secondary | ICD-10-CM | POA: Insufficient documentation

## 2019-01-29 LAB — CBC AND DIFFERENTIAL
HCT: 37 (ref 36–46)
Hemoglobin: 12.5 (ref 12.0–16.0)
Platelets: 133 — AB (ref 150–399)
WBC: 5.1

## 2019-01-29 LAB — BASIC METABOLIC PANEL
BUN: 19 (ref 4–21)
Creatinine: 0.7 (ref 0.5–1.1)
Glucose: 174
Potassium: 4.7 (ref 3.4–5.3)

## 2019-01-30 LAB — BASIC METABOLIC PANEL
BUN: 21 (ref 4–21)
Creatinine: 0.7 (ref 0.5–1.1)
Glucose: 150
Potassium: 4.4 (ref 3.4–5.3)
Sodium: 136 — AB (ref 137–147)

## 2019-01-30 LAB — CBC AND DIFFERENTIAL
HCT: 36 (ref 36–46)
Platelets: 156 (ref 150–399)
WBC: 6.3

## 2019-01-31 LAB — BASIC METABOLIC PANEL
BUN: 25 — AB (ref 4–21)
Creatinine: 0.8 (ref 0.5–1.1)
Glucose: 166
Potassium: 4.6 (ref 3.4–5.3)
Sodium: 137 (ref 137–147)

## 2019-01-31 LAB — CBC AND DIFFERENTIAL
HCT: 40 (ref 36–46)
Hemoglobin: 13.4 (ref 12.0–16.0)
Platelets: 184 (ref 150–399)
WBC: 4.6

## 2019-01-31 MED ORDER — ZOLPIDEM TARTRATE 5 MG PO TABS
10.00 | ORAL_TABLET | ORAL | Status: DC
Start: ? — End: 2019-01-31

## 2019-01-31 MED ORDER — GENERIC EXTERNAL MEDICATION
5.00 | Status: DC
Start: ? — End: 2019-01-31

## 2019-01-31 MED ORDER — ALBUTEROL SULFATE HFA 108 (90 BASE) MCG/ACT IN AERS
2.00 | INHALATION_SPRAY | RESPIRATORY_TRACT | Status: DC
Start: ? — End: 2019-01-31

## 2019-01-31 MED ORDER — HYDRALAZINE HCL 10 MG PO TABS
10.00 | ORAL_TABLET | ORAL | Status: DC
Start: ? — End: 2019-01-31

## 2019-01-31 MED ORDER — DEXTROSE 10 % IV SOLN
125.00 | INTRAVENOUS | Status: DC
Start: ? — End: 2019-01-31

## 2019-01-31 MED ORDER — DOCUSATE SODIUM 100 MG PO CAPS
100.00 | ORAL_CAPSULE | ORAL | Status: DC
Start: ? — End: 2019-01-31

## 2019-01-31 MED ORDER — ALUMINUM-MAGNESIUM-SIMETHICONE 200-200-20 MG/5ML PO SUSP
30.00 | ORAL | Status: DC
Start: ? — End: 2019-01-31

## 2019-01-31 MED ORDER — BENZONATATE 100 MG PO CAPS
100.00 | ORAL_CAPSULE | ORAL | Status: DC
Start: ? — End: 2019-01-31

## 2019-01-31 MED ORDER — FUROSEMIDE 40 MG PO TABS
40.00 | ORAL_TABLET | ORAL | Status: DC
Start: 2019-02-01 — End: 2019-01-31

## 2019-01-31 MED ORDER — INSULIN LISPRO 100 UNIT/ML ~~LOC~~ SOLN
2.00 | SUBCUTANEOUS | Status: DC
Start: 2019-01-31 — End: 2019-01-31

## 2019-01-31 MED ORDER — GLUCAGON HCL RDNA (DIAGNOSTIC) 1 MG IJ SOLR
1.00 | INTRAMUSCULAR | Status: DC
Start: ? — End: 2019-01-31

## 2019-01-31 MED ORDER — ACETAMINOPHEN 325 MG PO TABS
650.00 | ORAL_TABLET | ORAL | Status: DC
Start: ? — End: 2019-01-31

## 2019-01-31 MED ORDER — GLUCOSE 40 % PO GEL
15.00 | ORAL | Status: DC
Start: ? — End: 2019-01-31

## 2019-01-31 MED ORDER — GUAIFENESIN 100 MG/5ML PO SYRP
200.00 | ORAL_SOLUTION | ORAL | Status: DC
Start: ? — End: 2019-01-31

## 2019-01-31 MED ORDER — ENOXAPARIN SODIUM 120 MG/0.8ML ~~LOC~~ SOLN
105.00 | SUBCUTANEOUS | Status: DC
Start: 2019-01-31 — End: 2019-01-31

## 2019-01-31 MED ORDER — ONDANSETRON 4 MG PO TBDP
4.00 | ORAL_TABLET | ORAL | Status: DC
Start: ? — End: 2019-01-31

## 2019-01-31 MED ORDER — GENERIC EXTERNAL MEDICATION
6.00 | Status: DC
Start: 2019-02-01 — End: 2019-01-31

## 2019-02-07 ENCOUNTER — Telehealth: Payer: Self-pay

## 2019-02-07 NOTE — Telephone Encounter (Signed)
Patient was recently seen in ED on 01/26/19 for shortness of breath and other things. Received fax from [facility] for [facility].  PCP will review and sign, if appropriate.

## 2019-02-13 ENCOUNTER — Encounter: Payer: Self-pay | Admitting: Family Medicine

## 2019-02-26 ENCOUNTER — Encounter: Payer: Self-pay | Admitting: Family Medicine

## 2019-02-26 NOTE — Telephone Encounter (Signed)
LM for Bonnie Kane to return call.

## 2019-02-26 NOTE — Telephone Encounter (Signed)
Pt has not followed up from hospitalization for covid 10/10 to 10/15. I reviewed all of patient's hospital records and the only O2 sats documented are "90% on room air" and 96% on 2L oxygen-->but this was on the day of admission to hospital (not in chronic stable state as an outpatient or within 2 days prior to discharge from hosp to home.  I don't know if I have sufficient data to fill out the certification of medical necessity for her oxygen that she was sent home from the hospital with.  Pls call the oxygen supplier ([employer] in [facility]; I'll put the oxygen auth form on your desk) and ask if a home oxygen measurement done by the patient in the 1-2 days after d/c home, or does it have to be a oxygen sat documented in a post-hospital f/u visit? Let me know-thx

## 2019-02-27 NOTE — Telephone Encounter (Signed)
Yes, form will still to be completed and sent back. Form placed on your desk

## 2019-02-27 NOTE — Telephone Encounter (Signed)
Moshe Cipro returned my call and advised no further testing was needed. If you prefer to have a f/u visit with her to determine if she needs to remain on oxygen, it would be your call.

## 2019-02-27 NOTE — Telephone Encounter (Signed)
Just want to clarify: does the form still need to be filled out and sent back?

## 2019-03-26 ENCOUNTER — Telehealth: Payer: Self-pay | Admitting: Family Medicine

## 2019-03-26 NOTE — Telephone Encounter (Signed)
Patient reports that her feet are so swollen that she has blisters. She requested if Dr. Anitra Lauth knew if there was a higher mg of Lasix to help with fluid.  Please call patient at [phone].   Thank you

## 2019-03-26 NOTE — Telephone Encounter (Signed)
Patient currently takes 40mg  of Lasix qd. She had 4 blisters, 3 of which are better. She has a cough that has not gone away and would like to know what she should do.  Please advise, thanks.

## 2019-03-27 ENCOUNTER — Other Ambulatory Visit: Payer: Self-pay

## 2019-03-27 ENCOUNTER — Telehealth: Payer: Self-pay | Admitting: Family Medicine

## 2019-03-27 NOTE — Telephone Encounter (Signed)
I need to see her. I have not seen her since her covid illness/hospitalization about 2 mo ago--needs to be in person.

## 2019-03-27 NOTE — Telephone Encounter (Signed)
Please advise if pt can be worked in today or sometime this week.

## 2019-03-27 NOTE — Telephone Encounter (Signed)
Patient's blister is shooting blood 12". Patient's husband would like to make an in person visit today. Patient is requesting a call back ASAP.

## 2019-03-27 NOTE — Telephone Encounter (Signed)
MyChart message read.

## 2019-03-27 NOTE — Telephone Encounter (Signed)
Pt is on schedule for tomorrow

## 2019-03-28 ENCOUNTER — Encounter: Payer: Self-pay | Admitting: Family Medicine

## 2019-03-28 ENCOUNTER — Other Ambulatory Visit: Payer: Self-pay

## 2019-03-28 ENCOUNTER — Ambulatory Visit (INDEPENDENT_AMBULATORY_CARE_PROVIDER_SITE_OTHER): Payer: Medicare Other | Admitting: Family Medicine

## 2019-03-28 VITALS — BP 122/82 | HR 79 | Temp 98.0°F | Resp 16 | Ht 64.0 in

## 2019-03-28 DIAGNOSIS — R0600 Dyspnea, unspecified: Secondary | ICD-10-CM | POA: Diagnosis not present

## 2019-03-28 DIAGNOSIS — I83899 Varicose veins of unspecified lower extremities with other complications: Secondary | ICD-10-CM | POA: Diagnosis not present

## 2019-03-28 DIAGNOSIS — N1831 Chronic kidney disease, stage 3a: Secondary | ICD-10-CM | POA: Diagnosis not present

## 2019-03-28 DIAGNOSIS — I872 Venous insufficiency (chronic) (peripheral): Secondary | ICD-10-CM

## 2019-03-28 DIAGNOSIS — Z8616 Personal history of COVID-19: Secondary | ICD-10-CM

## 2019-03-28 DIAGNOSIS — Z8619 Personal history of other infectious and parasitic diseases: Secondary | ICD-10-CM

## 2019-03-28 DIAGNOSIS — E118 Type 2 diabetes mellitus with unspecified complications: Secondary | ICD-10-CM

## 2019-03-28 DIAGNOSIS — R0609 Other forms of dyspnea: Secondary | ICD-10-CM

## 2019-03-28 DIAGNOSIS — N179 Acute kidney failure, unspecified: Secondary | ICD-10-CM

## 2019-03-28 DIAGNOSIS — I5042 Chronic combined systolic (congestive) and diastolic (congestive) heart failure: Secondary | ICD-10-CM

## 2019-03-28 MED ORDER — FUROSEMIDE 40 MG PO TABS: ORAL_TABLET | ORAL | 1 refills | Status: DC

## 2019-03-28 MED ORDER — ZOLPIDEM TARTRATE 10 MG PO TABS: ORAL_TABLET | ORAL | 1 refills | Status: DC

## 2019-03-28 NOTE — Telephone Encounter (Signed)
Pt has been scheduled for today, in office regarding this issue.

## 2019-03-28 NOTE — Patient Instructions (Signed)
Limit your sodium intake to 2 grams per day. Continue to monitor your weight every day: call if you gain 3 or more pounds in a 24 hour period OR if you gain 5 or more pounds in a 5 day period.

## 2019-03-28 NOTE — Progress Notes (Signed)
OFFICE VISIT  03/28/2019   CC:  Chief Complaint  Patient presents with  . Check foot, listen to lungs   HPI:    Patient is a 73 y.o. Caucasian female with HTN, chronic combined syst/diast HF, hx of TAVR, DM 2, bilat LL venous insufficiency edema, morbid obesity and hypertriglyceridemia who presents for "check foot and listen to lungs since having pneumonia". She was hospitalized for covid 19 infection 01/2019, had acute hypoxic RF, went home on oxygen.  Still with cough, chest heaviness still since going home from her covid hospitalization.  No wheezing.  Nonproductive cough for the most part.  NO fevers.  Has always had "heavy breathing" all her life.  Some DOE. Gets blisters on tops of feet.  She recently popped one. Uses some topical abx on it.  Next morning she had pulsatile bleeding from the wound on top of right foot.  It stopped with pressure and leg elevation. Her LE's swelling is persistent but not quite as bad as it has been in the past.  HOME BP's: usually 120s/70.  I reviewed her entire medical record from [facility] 19 hospitalization 10/10-10/15, 2020.  We did not discuss her glucoses today.  ROS: no CP, no cough, no dizziness, no HAs, no rashes, no melena/hematochezia.  No polyuria or polydipsia.  No myalgias or arthralgias.   Past Medical History:  Diagnosis Date  . CAD (coronary artery disease) 01/2019   Noted on CT angio chest during the time of her covid illness.  EKG with evidence of inferior and inferolateral MIs.  . Chronic combined systolic and diastolic CHF (congestive heart failure) ([place]) 01/2019  . COVID-19 virus infection 01/2019   Covid test + 01/26/19-->acute hypox RF ([facility]).  . Diabetes mellitus    Type 2  . Edema of both lower extremities due to peripheral venous insufficiency   . Heart murmur, systolic [id number]   Never could get pt to get ECHO arranged in her hometown [place], [place]  Finally, when she was dx'd with breast cancer  06/2016, she had to get echo and it showed severe AS and she subsequently got TAVR.  Marland Kitchen Hepatic steatosis 2020  . History of left breast cancer 06/17/2016   Invasive ductal carcinoma-->Left mastectomy with axillary lymph node dissection, +radiation,+chemo (all done in [place], [place]).  Marland Kitchen HTN (hypertension), benign   . Hx of total knee replacement, bilateral 2018  . Hypertriglyceridemia   . Obesity   . Osteoarthritis    low back, hips, knees  . S/P TAVR (transcatheter aortic valve replacement) 07/2016   [facility]  . Transaminasemia mild 4/11   hep panel- neg    Past Surgical History:  Procedure Laterality Date  . APPENDECTOMY  1986  . MASTECTOMY WITH AXILLARY LYMPH NODE DISSECTION  06/2016   [place], [place]  . REPLACEMENT TOTAL KNEE BILATERAL  2018/19   [place], [place] ABDOMINAL HYSTERECTOMY  1986  . TRANSCATHETER AORTIC VALVE REPLACEMENT, TRANSFEMORAL  07/2016   [facility].  (Dr. Aline Brochure?)  Pt states she got f/u echo approx 01/2018.  Marland Kitchen TRANSTHORACIC ECHOCARDIOGRAM  01/2019   during covid illness->EF 40-45%, DD, global hypokinesis, prosthetic AV OK.    Outpatient Medications Prior to Visit  Medication Sig Dispense Refill  . acetaminophen (TYLENOL) 325 MG tablet Take by mouth.    . ASPIRIN 81 PO Take 81 mg by mouth.    . vitamin E 400 UNIT capsule Take by mouth daily.    . furosemide (LASIX) 40 MG tablet Take 1  tablet (40 mg total) by mouth daily. 90 tablet 1  . zolpidem (AMBIEN) 10 MG tablet TAKE 1 TABLET BY MOUTH ONCE DAILY AT BEDTIME AS NEEDED FOR SLEEP 90 tablet 1  . clindamycin (CLEOCIN) 300 MG capsule Take 2 capsules 1 hour prior to dental work     No facility-administered medications prior to visit.    Allergies  Allergen Reactions  . Penicillins     REACTION: neck/throat swelling  . Gabapentin Other (See Comments)    emotional    ROS As per HPI  PE: Blood pressure 122/82, pulse 79, temperature 98 F (36.7 C), temperature source Temporal, resp. rate 16, height 5\' 4"   (1.626 m), SpO2 95 %. Body mass index is 40.51 kg/m.  Gen: Alert, well appearing.  Patient is oriented to person, place, time, and situation. CV: RRR, no m/r/g.   LUNGS: CTA bilat except a trace of early insp dry crackles in both bases.   Nonlabored resps, good aeration in all lung fields. EXT: minimal pitting edema, with diffuse brawny skin thickening from mid tibia level down into ankles. Dorsum R foot with scab about 6 cm x 6 cm square, covering superficial ulceration. Several mod sized varicose veins  Surround this.    LABS:  No results found for: TSH Lab Results  Component Value Date   WBC 4.6 01/31/2019   HGB 13.4 01/31/2019   HCT 40 01/31/2019   PLT 184 01/31/2019   Lab Results  Component Value Date   CREATININE 0.8 01/31/2019   BUN 25 (A) 01/31/2019   NA 137 01/31/2019   K 4.6 01/31/2019   CL 103 03/02/2016   CO2 27 03/02/2016   Lab Results  Component Value Date   ALT 17 01/26/2019   AST 29 01/26/2019   ALKPHOS 51 01/26/2019   BILITOT 0.5 09/09/2015   Lab Results  Component Value Date   CHOL 164 09/09/2015   Lab Results  Component Value Date   HDL 23 (A) 01/26/2019   Lab Results  Component Value Date   LDLCALC 46 01/26/2019   Lab Results  Component Value Date   TRIG 92 01/26/2019   Lab Results  Component Value Date   CHOLHDL 5 09/09/2015     Chemistry      Component Value Date/Time   NA 137 01/31/2019 0000   K 4.6 01/31/2019 0000   CL 103 03/02/2016 0926   CO2 27 03/02/2016 0926   BUN 25 (A) 01/31/2019 0000   CREATININE 0.8 01/31/2019 0000   CREATININE 0.94 03/02/2016 0926   CREATININE 0.89 10/27/2010 0912   GLU 166 01/31/2019 0000      Component Value Date/Time   CALCIUM 9.9 03/02/2016 0926   ALKPHOS 51 01/26/2019 0000   AST 29 01/26/2019 0000   ALT 17 01/26/2019 0000   BILITOT 0.5 09/09/2015 0928     Lab Results  Component Value Date   HGBA1C 6.2 03/02/2016   HbA1c 6.2% on 01/26/19  IMPRESSION AND PLAN:  1) Cough/DOE:  residual from covid 19 pneumonia. No longer requiring oxygen (she did not use any oxygen after d/c home from hospital).  Deconditioning playing a role as well. Unclear if acute CHF playing a role but given lack of signs of volume overload on exam today and daily wts stable around 234 lbs at home lately. Check lytes, CBC, BNP, and CXR. Continue lasix 40mg  qd for now: we'll see what her CXR and renal function look like.  2)  R foot ruptured varicose vein: doing  fine at this time. NO sign of infection. Wet to dry dressing bid. She will be going back to the vascular clinic at [facility] relatively soon.  3) Bilat LL venous insufficiency edema: minimal pitting today. Continue lasix 40mg  qd. Compression stockings caused severe pain, esp when she took them off (?tore skin"). She does not watch sodium intake or elevate legs so I reiterated the importance of this.  4) DM: next A1c due 06/02/18. Controlling with diet-only at this time.  5) Acute on chronic renal insufficiency: sCr returned to 0.65 on 02/10/19 (the day of d/c home from hosp).   An After Visit Summary was printed and given to the patient.  FOLLOW UP: Return in about 2 months (around 05/29/2019) for routine chronic illness f/u.  Signed:  Crissie Sickles, MD           03/28/2019

## 2019-03-29 ENCOUNTER — Ambulatory Visit (HOSPITAL_BASED_OUTPATIENT_CLINIC_OR_DEPARTMENT_OTHER)
Admission: RE | Admit: 2019-03-29 | Discharge: 2019-03-29 | Disposition: A | Discharge status: Home / Self Care | Payer: Medicare Other | Source: Ambulatory Visit | Attending: Family Medicine | Admitting: Family Medicine

## 2019-03-29 ENCOUNTER — Other Ambulatory Visit: Payer: Self-pay

## 2019-03-29 DIAGNOSIS — I5042 Chronic combined systolic (congestive) and diastolic (congestive) heart failure: Secondary | ICD-10-CM

## 2019-03-29 DIAGNOSIS — R0609 Other forms of dyspnea: Secondary | ICD-10-CM

## 2019-03-29 DIAGNOSIS — Z8616 Personal history of COVID-19: Secondary | ICD-10-CM

## 2019-03-29 DIAGNOSIS — R0600 Dyspnea, unspecified: Secondary | ICD-10-CM | POA: Insufficient documentation

## 2019-03-29 DIAGNOSIS — Z8619 Personal history of other infectious and parasitic diseases: Secondary | ICD-10-CM | POA: Diagnosis not present

## 2019-03-29 LAB — CBC WITH DIFFERENTIAL/PLATELET
Basophils Absolute: 0 10*3/uL (ref 0.0–0.1)
Basophils Relative: 0.5 % (ref 0.0–3.0)
Eosinophils Absolute: 0.3 10*3/uL (ref 0.0–0.7)
Eosinophils Relative: 3 % (ref 0.0–5.0)
HCT: 51.5 % — ABNORMAL HIGH (ref 36.0–46.0)
Hemoglobin: 16.5 g/dL — ABNORMAL HIGH (ref 12.0–15.0)
Lymphocytes Relative: 11.6 % — ABNORMAL LOW (ref 12.0–46.0)
Lymphs Abs: 1 10*3/uL (ref 0.7–4.0)
MCHC: 32 g/dL (ref 30.0–36.0)
MCV: 92.7 fl (ref 78.0–100.0)
Monocytes Absolute: 0.4 10*3/uL (ref 0.1–1.0)
Monocytes Relative: 4.9 % (ref 3.0–12.0)
Neutro Abs: 6.8 10*3/uL (ref 1.4–7.7)
Neutrophils Relative %: 80 % — ABNORMAL HIGH (ref 43.0–77.0)
Platelets: 158 10*3/uL (ref 150.0–400.0)
RBC: 5.55 Mil/uL — ABNORMAL HIGH (ref 3.87–5.11)
RDW: 19 % — ABNORMAL HIGH (ref 11.5–15.5)
WBC: 8.6 10*3/uL (ref 4.0–10.5)

## 2019-03-29 LAB — COMPREHENSIVE METABOLIC PANEL
ALT: 21 U/L (ref 0–35)
AST: 25 U/L (ref 0–37)
Albumin: 3.7 g/dL (ref 3.5–5.2)
Alkaline Phosphatase: 70 U/L (ref 39–117)
BUN: 23 mg/dL (ref 6–23)
CO2: 26 mEq/L (ref 19–32)
Calcium: 9.6 mg/dL (ref 8.4–10.5)
Chloride: 105 mEq/L (ref 96–112)
Creatinine, Ser: 0.98 mg/dL (ref 0.40–1.20)
GFR: 55.47 mL/min — ABNORMAL LOW (ref >60.00)
Glucose, Bld: 137 mg/dL — ABNORMAL HIGH (ref 70–99)
Potassium: 4.9 mEq/L (ref 3.5–5.1)
Sodium: 143 mEq/L (ref 135–145)
Total Bilirubin: 0.6 mg/dL (ref 0.2–1.2)
Total Protein: 7.5 g/dL (ref 6.0–8.3)

## 2019-03-29 LAB — BRAIN NATRIURETIC PEPTIDE: Pro B Natriuretic peptide (BNP): 867 pg/mL — ABNORMAL HIGH (ref 0.0–100.0)

## 2019-04-05 ENCOUNTER — Ambulatory Visit (INDEPENDENT_AMBULATORY_CARE_PROVIDER_SITE_OTHER): Payer: Medicare Other | Admitting: Family Medicine

## 2019-04-05 ENCOUNTER — Other Ambulatory Visit: Payer: Self-pay

## 2019-04-05 DIAGNOSIS — I5042 Chronic combined systolic (congestive) and diastolic (congestive) heart failure: Secondary | ICD-10-CM | POA: Diagnosis not present

## 2019-04-05 LAB — BASIC METABOLIC PANEL
BUN: 25 mg/dL — ABNORMAL HIGH (ref 6–23)
CO2: 29 mEq/L (ref 19–32)
Calcium: 9.4 mg/dL (ref 8.4–10.5)
Chloride: 101 mEq/L (ref 96–112)
Creatinine, Ser: 0.96 mg/dL (ref 0.40–1.20)
GFR: 56.8 mL/min — ABNORMAL LOW (ref >60.00)
Glucose, Bld: 210 mg/dL — ABNORMAL HIGH (ref 70–99)
Potassium: 4.3 mEq/L (ref 3.5–5.1)
Sodium: 138 mEq/L (ref 135–145)

## 2019-04-05 LAB — BRAIN NATRIURETIC PEPTIDE: Pro B Natriuretic peptide (BNP): 296 pg/mL — ABNORMAL HIGH (ref 0.0–100.0)

## 2019-05-20 ENCOUNTER — Encounter: Payer: Self-pay | Admitting: Family Medicine

## 2019-05-20 ENCOUNTER — Ambulatory Visit (INDEPENDENT_AMBULATORY_CARE_PROVIDER_SITE_OTHER): Payer: Medicare Other | Admitting: Family Medicine

## 2019-05-20 ENCOUNTER — Telehealth: Payer: Self-pay

## 2019-05-20 ENCOUNTER — Other Ambulatory Visit: Payer: Self-pay

## 2019-05-20 VITALS — BP 130/78 | HR 80 | Temp 98.0°F | Resp 16 | Ht 64.0 in

## 2019-05-20 DIAGNOSIS — I83025 Varicose veins of left lower extremity with ulcer other part of foot: Secondary | ICD-10-CM | POA: Diagnosis not present

## 2019-05-20 DIAGNOSIS — M79672 Pain in left foot: Secondary | ICD-10-CM

## 2019-05-20 DIAGNOSIS — R238 Other skin changes: Secondary | ICD-10-CM

## 2019-05-20 DIAGNOSIS — L97521 Non-pressure chronic ulcer of other part of left foot limited to breakdown of skin: Secondary | ICD-10-CM | POA: Diagnosis not present

## 2019-05-20 MED ORDER — DOXYCYCLINE HYCLATE 100 MG PO CAPS: 100.0000 mg | ORAL_CAPSULE | Freq: Two times a day (BID) | ORAL | 0 refills | Status: DC

## 2019-05-20 MED ORDER — HYDROCODONE-ACETAMINOPHEN 5-325 MG PO TABS: 1.0000 | ORAL_TABLET | Freq: Four times a day (QID) | ORAL | 0 refills | Status: DC | PRN

## 2019-05-20 NOTE — Telephone Encounter (Signed)
PCP gave verbal okay to add patient to schedule today for in office visit.

## 2019-05-20 NOTE — Telephone Encounter (Signed)
Patient has blisters on the top of her feet. They are very painful. Please advise, no appointments available. Patient is requesting medication for the pain to hold her over for an appointment.

## 2019-05-20 NOTE — Progress Notes (Signed)
OFFICE VISIT  05/20/2019   CC:  Chief Complaint  Patient presents with  . Blisters on feet    recent visit on 03/28/19 regarding same issue,    HPI:    Patient is a 74 y.o. Caucasian female who presents accompanied by her husband Herb for "blisters on foot". Onset 6-7 d/a after she dropped a book and it hit her L shin region.  A small ulceration has developed there and she has had gradual progression of pain and swelling in L ankle and foot (a bit more than her usual) and several blisters in L ankle and top of foot.  A few blisters have busted when she has applied wet/dry gauze. Has applied various non-rx topical meds with no improvement. No f/c/malaise but pain in the top of left foot is excruciating.  She has sensation in feet/toes.   She is not elevating legs above the level of the heart, but elevates L leg on recliner. She is taking lasix 40mg  once a day as rx'd.   Past Medical History:  Diagnosis Date  . CAD (coronary artery disease) 01/2019   Noted on CT angio chest during the time of her covid illness.  EKG with evidence of inferior and inferolateral MIs.  . Chronic combined systolic and diastolic CHF (congestive heart failure) ([place]) 01/2019  . COVID-19 virus infection 01/2019   Covid test + 01/26/19-->acute hypox RF ([facility]).  . Diabetes mellitus    Type 2  . Edema of both lower extremities due to peripheral venous insufficiency   . Heart murmur, systolic [id number]   Never could get pt to get ECHO arranged in her hometown [place], [place]  Finally, when she was dx'd with breast cancer 06/2016, she had to get echo and it showed severe AS and she subsequently got TAVR.  Marland Kitchen Hepatic steatosis 2020  . History of left breast cancer 06/17/2016   Invasive ductal carcinoma-->Left mastectomy with axillary lymph node dissection, +radiation,+chemo (all done in [place], [place]).  Marland Kitchen HTN (hypertension), benign   . Hx of total knee replacement, bilateral 2018  .  Hypertriglyceridemia   . Obesity   . Osteoarthritis    low back, hips, knees  . S/P TAVR (transcatheter aortic valve replacement) 07/2016   [facility]  . Transaminasemia mild 4/11   hep panel- neg    Past Surgical History:  Procedure Laterality Date  . APPENDECTOMY  1986  . MASTECTOMY WITH AXILLARY LYMPH NODE DISSECTION  06/2016   [place], [place]  . REPLACEMENT TOTAL KNEE BILATERAL  2018/19   [place], [place] ABDOMINAL HYSTERECTOMY  1986  . TRANSCATHETER AORTIC VALVE REPLACEMENT, TRANSFEMORAL  07/2016   [facility].  (Dr. Aline Brochure?)  Pt states she got f/u echo approx 01/2018.  Marland Kitchen TRANSTHORACIC ECHOCARDIOGRAM  01/2019   during covid illness->EF 40-45%, DD, global hypokinesis, prosthetic AV OK.   Pt taking lasix 40mg  ONCE DAILY (not bid as listed below) Outpatient Medications Prior to Visit  Medication Sig Dispense Refill  . acetaminophen (TYLENOL) 325 MG tablet Take by mouth.    . ASPIRIN 81 PO Take 81 mg by mouth.    . furosemide (LASIX) 40 MG tablet 1 tab po bid 180 tablet 1  . vitamin E 400 UNIT capsule Take by mouth daily.    Marland Kitchen zolpidem (AMBIEN) 10 MG tablet TAKE 1 TABLET BY MOUTH ONCE DAILY AT BEDTIME AS NEEDED FOR SLEEP 90 tablet 1   No facility-administered medications prior to visit.    Allergies  Allergen Reactions  .  Penicillins     REACTION: neck/throat swelling  . Gabapentin Other (See Comments)    emotional    ROS: no CP, no SOB, no wheezing, no cough, no dizziness, no HAs, no melena/hematochezia.  No polyuria or polydipsia.  No myalgias or arthralgias. No joint swelling.  No n/v/d.  PE: Blood pressure 130/78, pulse 80, temperature 98 F (36.7 C), temperature source Temporal, resp. rate 16, height 5\' 4"  (1.626 m), SpO2 97 %.Pt refused to be weighed today. Gen: Alert, well appearing.  Patient is oriented to person, place, time, and situation. AFFECT: pleasant, but holding back tears most of the time, lucid thought and speech. Left shin down to distal foot with  pinkish discoloration, thickened skin c/w chronic venous stasis changes. She has several bullae, some 1-2 cm size, in L ankle and top of foot.  A few have milky appearing thin fluid. There are 3 superficial ulcerations on anterior L shin/ankle, each one approx 3 cm diameter.  TTP all over L ankle and foot.  No streaking or nodularity.  She cannot tolerate pulse checks due to pain/tenderness but toes have intact strength and appear pink. She has definite non-pitting edema in both ankles and feet c/w her past exams.  Some scattered non-inflamed varicosities and spider veins are present on both ankles/feet.   LABS:    Chemistry      Component Value Date/Time   NA 138 04/05/2019 0758   NA 137 01/31/2019 0000   K 4.3 04/05/2019 0758   CL 101 04/05/2019 0758   CO2 29 04/05/2019 0758   BUN 25 (H) 04/05/2019 0758   BUN 25 (A) 01/31/2019 0000   CREATININE 0.96 04/05/2019 0758   CREATININE 0.89 10/27/2010 0912   GLU 166 01/31/2019 0000      Component Value Date/Time   CALCIUM 9.4 04/05/2019 0758   ALKPHOS 70 03/28/2019 1553   AST 25 03/28/2019 1553   ALT 21 03/28/2019 1553   BILITOT 0.6 03/28/2019 1553     Lab Results  Component Value Date   WBC 8.6 03/28/2019   HGB 16.5 (H) 03/28/2019   HCT 51.5 (H) 03/28/2019   MCV 92.7 03/28/2019   PLT 158.0 03/28/2019    IMPRESSION AND PLAN:  Acute worsening of L ankle/foot venous stasis edema, superficial ulcerations x 3, and acute/worsening pain in L foot.  Suspect acute cellulitis. No sign of systemic illness. I used a needle to break one of the milky colored blisters on top of L foot and swabbed it--serous fluid present but no pus.   Plan: sent clx of blister fluid. Start doxycycline 100 mg bid x 10d. Increase lasix to 40mg  bid for the next 3-4 days until I see her in office for f/u.  Elevate left leg/foot above the level of the heart as long as possible. REcheck BMET at f/u visit later this week. Wet to dry wrap over involved area today,  change bid, and referred pt to wound care clinic-->potential need for short term mgmt by expert wound care provider. Recommended pt apply no topical meds/lotions/creams/ointments to the area involved for right now.  An After Visit Summary was printed and given to the patient.  FOLLOW UP: No follow-ups on file.  Signed:  Crissie Sickles, MD           05/20/2019

## 2019-05-23 ENCOUNTER — Encounter: Payer: Self-pay | Admitting: Family Medicine

## 2019-05-23 ENCOUNTER — Ambulatory Visit (INDEPENDENT_AMBULATORY_CARE_PROVIDER_SITE_OTHER): Payer: Medicare Other | Admitting: Family Medicine

## 2019-05-23 ENCOUNTER — Other Ambulatory Visit: Payer: Self-pay

## 2019-05-23 VITALS — BP 130/71 | HR 74 | Temp 98.0°F | Resp 16 | Ht 64.0 in

## 2019-05-23 DIAGNOSIS — I8390 Asymptomatic varicose veins of unspecified lower extremity: Secondary | ICD-10-CM

## 2019-05-23 DIAGNOSIS — L089 Local infection of the skin and subcutaneous tissue, unspecified: Secondary | ICD-10-CM | POA: Diagnosis not present

## 2019-05-23 DIAGNOSIS — I890 Lymphedema, not elsewhere classified: Secondary | ICD-10-CM | POA: Diagnosis not present

## 2019-05-23 DIAGNOSIS — I872 Venous insufficiency (chronic) (peripheral): Secondary | ICD-10-CM

## 2019-05-23 LAB — WOUND CULTURE
MICRO NUMBER:: 10104262
SPECIMEN QUALITY:: ADEQUATE

## 2019-05-23 MED ORDER — CIPROFLOXACIN HCL 500 MG PO TABS: 500.0000 mg | ORAL_TABLET | Freq: Two times a day (BID) | ORAL | 0 refills | Status: AC

## 2019-05-23 MED ORDER — HYDROCODONE-ACETAMINOPHEN 5-325 MG PO TABS: 1.0000 | ORAL_TABLET | Freq: Four times a day (QID) | ORAL | 0 refills | Status: DC | PRN

## 2019-05-23 NOTE — Progress Notes (Signed)
OFFICE VISIT  05/23/2019   CC:  Chief Complaint  Patient presents with  . Follow-up    3 day for blister    HPI:    Patient is a 74 y.o. Caucasian female who presents for 3 day f/u L foot ulcerations, bullae, and suspected infection/cellulitis. A/P as of last visit: "Acute worsening of L ankle/foot venous stasis edema, superficial ulcerations x 3, and acute/worsening pain in L foot.  Suspect acute cellulitis. No sign of systemic illness. I used a needle to break one of the milky colored blisters on top of L foot and swabbed it--serous fluid present but no pus.   Plan: sent clx of blister fluid. Start doxycycline 100 mg bid x 10d. Increase lasix to 40mg  bid for the next 3-4 days until I see her in office for f/u.  Elevate left leg/foot above the level of the heart as long as possible. REcheck BMET at f/u visit later this week. Wet to dry wrap over involved area today, change bid, and referred pt to wound care clinic-->potential need for short term mgmt by expert wound care provider. Recommended pt apply no topical meds/lotions/creams/ointments to the area involved for right now."  Interim hx: Wound culture result returned today and showed abundant serratia marcescens, resistant to penicillins, zefazolin, and intermediate resistance to levaquin. Doxy sensitivities not done.  Cipro sensitive and bactrim sensitive (cipro with better MIC).   Pain is better controlled w/ q4h use of vicodin.  She feels a little better and says he foot has been improving a little bit.  No f/c/malaise. Still with blisters. Changing wet to dry dressings bid. Taking doxy as rx'd.  Taking lasix 40mg  bid as instructed, she notes signif inc in urination.  ROS: no CP, no SOB, no wheezing, no cough, no dizziness, no HAs, no melena/hematochezia.  No polyuria or polydipsia.  No myalgias or arthralgias.  Past Medical History:  Diagnosis Date  . CAD (coronary artery disease) 01/2019   Noted on CT angio chest  during the time of her covid illness.  EKG with evidence of inferior and inferolateral MIs.  . Chronic combined systolic and diastolic CHF (congestive heart failure) ([place]) 01/2019  . COVID-19 virus infection 01/2019   Covid test + 01/26/19-->acute hypox RF ([facility]).  . Diabetes mellitus    Type 2  . Edema of both lower extremities due to peripheral venous insufficiency   . Heart murmur, systolic [id number]   Never could get pt to get ECHO arranged in her hometown [place], [place]  Finally, when she was dx'd with breast cancer 06/2016, she had to get echo and it showed severe AS and she subsequently got TAVR.  Marland Kitchen Hepatic steatosis 2020  . History of left breast cancer 06/17/2016   Invasive ductal carcinoma-->Left mastectomy with axillary lymph node dissection, +radiation,+chemo (all done in [place], [place]).  Marland Kitchen HTN (hypertension), benign   . Hx of total knee replacement, bilateral 2018  . Hypertriglyceridemia   . Obesity   . Osteoarthritis    low back, hips, knees  . S/P TAVR (transcatheter aortic valve replacement) 07/2016   [facility]  . Transaminasemia mild 4/11   hep panel- neg    Past Surgical History:  Procedure Laterality Date  . APPENDECTOMY  1986  . MASTECTOMY WITH AXILLARY LYMPH NODE DISSECTION  06/2016   [place], [place]  . REPLACEMENT TOTAL KNEE BILATERAL  2018/19   [place], [place] ABDOMINAL HYSTERECTOMY  1986  . TRANSCATHETER AORTIC VALVE REPLACEMENT, TRANSFEMORAL  07/2016  [place].  (Dr. Aline Brochure?)  Pt states she got f/u echo approx 01/2018.  Marland Kitchen TRANSTHORACIC ECHOCARDIOGRAM  01/2019   during covid illness->EF 40-45%, DD, global hypokinesis, prosthetic AV OK.    Outpatient Medications Prior to Visit  Medication Sig Dispense Refill  . acetaminophen (TYLENOL) 325 MG tablet Take by mouth.    . ASPIRIN 81 PO Take 81 mg by mouth.    . furosemide (LASIX) 40 MG tablet 1 tab po bid 180 tablet 1  . vitamin E 400 UNIT capsule Take by mouth daily.    Marland Kitchen zolpidem (AMBIEN)  10 MG tablet TAKE 1 TABLET BY MOUTH ONCE DAILY AT BEDTIME AS NEEDED FOR SLEEP 90 tablet 1  . doxycycline (VIBRAMYCIN) 100 MG capsule Take 1 capsule (100 mg total) by mouth 2 (two) times daily for 10 days. 20 capsule 0  . HYDROcodone-acetaminophen (NORCO/VICODIN) 5-325 MG tablet Take 1-2 tablets by mouth every 6 (six) hours as needed for moderate pain. 30 tablet 0   No facility-administered medications prior to visit.    Allergies  Allergen Reactions  . Penicillins     REACTION: neck/throat swelling  . Gabapentin Other (See Comments)    emotional    ROS As per HPI  PE: Blood pressure 130/71, pulse 74, temperature 98 F (36.7 C), temperature source Temporal, resp. rate 16, height 5\' 4"  (1.626 m), SpO2 93 %. Gen: Alert, well appearing.  Patient is oriented to person, place, time, and situation. AFFECT: pleasant, lucid thought and speech. Left ankle and foot with deep pink/violacious hue diffusely --same as R foot. Two large superficial ulcerations on top of foot/ankle with moist wound bed and a trace of granulation noted. Several 1-2 cm blisters remain scattered over top of foot and ankle, some with clear fluid noted and some with milky colored fluid noted.  She has moderate NONPITTING edema bilat LLs. No pallor or cyanosis. She has mild/mod tenderness to palpation of ankle and foot.  No streaking erythema, no excessive warmth. She can move all toes w/out problem.  LABS:    Chemistry      Component Value Date/Time   NA 138 04/05/2019 0758   NA 137 01/31/2019 0000   K 4.3 04/05/2019 0758   CL 101 04/05/2019 0758   CO2 29 04/05/2019 0758   BUN 25 (H) 04/05/2019 0758   BUN 25 (A) 01/31/2019 0000   CREATININE 0.96 04/05/2019 0758   CREATININE 0.89 10/27/2010 0912   GLU 166 01/31/2019 0000      Component Value Date/Time   CALCIUM 9.4 04/05/2019 0758   ALKPHOS 70 03/28/2019 1553   AST 25 03/28/2019 1553   ALT 21 03/28/2019 1553   BILITOT 0.6 03/28/2019 1553     Lab Results   Component Value Date   WBC 8.6 03/28/2019   HGB 16.5 (H) 03/28/2019   HCT 51.5 (H) 03/28/2019   MCV 92.7 03/28/2019   PLT 158.0 03/28/2019    IMPRESSION AND PLAN:  1) Chronic venous insufficiency edema, lymphedema. Complicated by superficial ulcerations, bullae, and infection. She is improved, but I do feel like she still needs to see wound care clinic (she has appt tomorrow), continue lasix 40mg  bid for now (lytes/cr pending today), d/c doxy and start cipro to cover for serratia marcescens that came out in wound clx I did last visit.  Continue wet to dry dressings.  Continue vicodin 5/325, 1-2 q6h prn.  I did a new rx for vicodin, #30, with instructions "fill on/after 05/26/19". BMET and CBC today.  Patient has seen vascular MD at [facility] a few months back.  Was told that compression stockings had to be tried before any procedure could be considered.  The compression hose adhered to her skin and when she took them off it "ripped the skin off".  Pt and husband ask today for referral to local vascular MD to address the LE venous situation again so I ordered referral.  An After Visit Summary was printed and given to the patient.  FOLLOW UP: Return for to be determined based upon clinical improvement and what wound care clinic does.  Signed:  Crissie Sickles, MD           05/23/2019

## 2019-05-23 NOTE — Patient Instructions (Signed)
Send me a MyChart message middle of next week to tell me how you are doing.

## 2019-05-24 ENCOUNTER — Encounter: Payer: Medicare Other | Attending: Physician Assistant | Admitting: Physician Assistant

## 2019-05-24 ENCOUNTER — Other Ambulatory Visit: Payer: Self-pay

## 2019-05-24 DIAGNOSIS — S90522A Blister (nonthermal), left ankle, initial encounter: Secondary | ICD-10-CM | POA: Diagnosis not present

## 2019-05-24 DIAGNOSIS — I890 Lymphedema, not elsewhere classified: Secondary | ICD-10-CM | POA: Insufficient documentation

## 2019-05-24 DIAGNOSIS — Z952 Presence of prosthetic heart valve: Secondary | ICD-10-CM | POA: Insufficient documentation

## 2019-05-24 DIAGNOSIS — R7989 Other specified abnormal findings of blood chemistry: Secondary | ICD-10-CM

## 2019-05-24 DIAGNOSIS — E11621 Type 2 diabetes mellitus with foot ulcer: Secondary | ICD-10-CM | POA: Insufficient documentation

## 2019-05-24 DIAGNOSIS — M1990 Unspecified osteoarthritis, unspecified site: Secondary | ICD-10-CM | POA: Diagnosis not present

## 2019-05-24 DIAGNOSIS — E1140 Type 2 diabetes mellitus with diabetic neuropathy, unspecified: Secondary | ICD-10-CM | POA: Diagnosis not present

## 2019-05-24 DIAGNOSIS — S80822A Blister (nonthermal), left lower leg, initial encounter: Secondary | ICD-10-CM | POA: Diagnosis not present

## 2019-05-24 DIAGNOSIS — E669 Obesity, unspecified: Secondary | ICD-10-CM | POA: Insufficient documentation

## 2019-05-24 DIAGNOSIS — I5042 Chronic combined systolic (congestive) and diastolic (congestive) heart failure: Secondary | ICD-10-CM | POA: Insufficient documentation

## 2019-05-24 DIAGNOSIS — Z880 Allergy status to penicillin: Secondary | ICD-10-CM | POA: Insufficient documentation

## 2019-05-24 DIAGNOSIS — L97521 Non-pressure chronic ulcer of other part of left foot limited to breakdown of skin: Secondary | ICD-10-CM | POA: Diagnosis not present

## 2019-05-24 DIAGNOSIS — L97512 Non-pressure chronic ulcer of other part of right foot with fat layer exposed: Secondary | ICD-10-CM | POA: Diagnosis not present

## 2019-05-24 DIAGNOSIS — Z87891 Personal history of nicotine dependence: Secondary | ICD-10-CM | POA: Insufficient documentation

## 2019-05-24 DIAGNOSIS — I872 Venous insufficiency (chronic) (peripheral): Secondary | ICD-10-CM | POA: Diagnosis not present

## 2019-05-24 DIAGNOSIS — Z6829 Body mass index (BMI) 29.0-29.9, adult: Secondary | ICD-10-CM | POA: Diagnosis not present

## 2019-05-24 DIAGNOSIS — L97821 Non-pressure chronic ulcer of other part of left lower leg limited to breakdown of skin: Secondary | ICD-10-CM | POA: Insufficient documentation

## 2019-05-24 DIAGNOSIS — E1151 Type 2 diabetes mellitus with diabetic peripheral angiopathy without gangrene: Secondary | ICD-10-CM | POA: Diagnosis not present

## 2019-05-24 DIAGNOSIS — L97522 Non-pressure chronic ulcer of other part of left foot with fat layer exposed: Secondary | ICD-10-CM | POA: Diagnosis not present

## 2019-05-24 DIAGNOSIS — I110 Hypertensive heart disease with heart failure: Secondary | ICD-10-CM | POA: Diagnosis not present

## 2019-05-24 LAB — CBC WITH DIFFERENTIAL/PLATELET
Absolute Monocytes: 385 cells/uL (ref 200–950)
Basophils Absolute: 28 cells/uL (ref 0–200)
Basophils Relative: 0.4 %
Eosinophils Absolute: 126 cells/uL (ref 15–500)
Eosinophils Relative: 1.8 %
HCT: 40.9 % (ref 35.0–45.0)
Hemoglobin: 12.9 g/dL (ref 11.7–15.5)
Lymphs Abs: 637 cells/uL — ABNORMAL LOW (ref 850–3900)
MCH: 27.3 pg (ref 27.0–33.0)
MCHC: 31.5 g/dL — ABNORMAL LOW (ref 32.0–36.0)
MCV: 86.5 fL (ref 80.0–100.0)
MPV: 11.8 fL (ref 7.5–12.5)
Monocytes Relative: 5.5 %
Neutro Abs: 5824 cells/uL (ref 1500–7800)
Neutrophils Relative %: 83.2 %
Platelets: 178 10*3/uL (ref 140–400)
RBC: 4.73 10*6/uL (ref 3.80–5.10)
RDW: 15 % (ref 11.0–15.0)
Total Lymphocyte: 9.1 %
WBC: 7 10*3/uL (ref 3.8–10.8)

## 2019-05-24 LAB — BASIC METABOLIC PANEL
BUN/Creatinine Ratio: 28 (calc) — ABNORMAL HIGH (ref 6–22)
BUN: 32 mg/dL — ABNORMAL HIGH (ref 7–25)
CO2: 28 mmol/L (ref 20–32)
Calcium: 9.2 mg/dL (ref 8.6–10.4)
Chloride: 104 mmol/L (ref 98–110)
Creat: 1.13 mg/dL — ABNORMAL HIGH (ref 0.60–0.93)
Glucose, Bld: 231 mg/dL — ABNORMAL HIGH (ref 65–99)
Potassium: 5 mmol/L (ref 3.5–5.3)
Sodium: 141 mmol/L (ref 135–146)

## 2019-05-24 NOTE — Progress Notes (Addendum)
Bonnie Kane, Bonnie Kane ([id number]) Visit Report for 05/24/2019 Chief Complaint Document Details Patient Name: Bonnie Kane, Bonnie Kane Date of Service: 05/24/2019 11:45 AM Medical Record Number: [id number] Patient Account Number: [id number] Date of Birth/Sex: 10-Jul-1945 (74 y.o. F) Treating RN: Montey Hora Primary Care Provider: Shawnie Dapper Other Clinician: Referring Provider: Shawnie Dapper Treating Provider/Extender: Melburn Hake, Zane Pellecchia Weeks in Treatment: 0 Information Obtained from: Patient Chief Complaint Left LE Blisters and Edema Electronic Signature(s) Signed: 05/24/2019 12:39:21 PM By: Worthy Keeler PA-C Entered By: Worthy Keeler on 05/24/2019 12:39:21 Bonnie Kane ([id number]) -------------------------------------------------------------------------------- Debridement Details Patient Name: Bonnie Kane Date of Service: 05/24/2019 11:45 AM Medical Record Number: [id number] Patient Account Number: [id number] Date of Birth/Sex: October 17, 1945 (74 y.o. F) Treating RN: Montey Hora Primary Care Provider: Shawnie Dapper Other Clinician: Referring Provider: Shawnie Dapper Treating Provider/Extender: STONE III, Jaydn Fincher Weeks in Treatment: 0 Debridement Performed for Wound #1 Left Lower Leg Assessment: Performed By: Physician STONE III, Kairie Vangieson E., PA-C Debridement Type: Chemical/Enzymatic/Mechanical Agent Used: Saline Level of Consciousness (Pre- Awake and Alert procedure): Pre-procedure Verification/Time Yes - 12:50 Out Taken: Start Time: 12:50 Instrument: Other : saline and gauze Bleeding: None End Time: 12:51 Procedural Pain: 0 Post Procedural Pain: 0 Response to Treatment: Procedure was tolerated well Level of Consciousness Awake and Alert (Post-procedure): Post Debridement Measurements of Total Wound Length: (cm) 4 Width: (cm) 5.5 Depth: (cm) 0.1 Volume: (cm) 1.728 Character of Wound/Ulcer Post Debridement: Improved Post Procedure Diagnosis Same as Pre-procedure Electronic  Signature(s) Signed: 05/24/2019 1:41:09 PM By: Montey Hora Signed: 05/24/2019 1:53:22 PM By: Worthy Keeler PA-C Entered By: Montey Hora on 05/24/2019 12:50:25 Bonnie Kane ([id number]) -------------------------------------------------------------------------------- Debridement Details Patient Name: Bonnie Kane Date of Service: 05/24/2019 11:45 AM Medical Record Number: [id number] Patient Account Number: [id number] Date of Birth/Sex: [birth year] (74 y.o. F) Treating RN: Montey Hora Primary Care Provider: Shawnie Dapper Other Clinician: Referring Provider: Shawnie Dapper Treating Provider/Extender: STONE III, Sheana Bir Weeks in Treatment: 0 Debridement Performed for Wound #3 Left,Dorsal Foot Assessment: Performed By: Physician STONE III, Arlester Keehan E., PA-C Debridement Type: Chemical/Enzymatic/Mechanical Agent Used: Saline Severity of Tissue Pre Fat layer exposed Debridement: Level of Consciousness (Pre- Awake and Alert procedure): Pre-procedure Verification/Time Yes - 12:51 Out Taken: Start Time: 12:51 Instrument: Other : saline and gauze Bleeding: None End Time: 12:52 Procedural Pain: 0 Post Procedural Pain: 0 Response to Treatment: Procedure was tolerated well Level of Consciousness Awake and Alert (Post-procedure): Post Debridement Measurements of Total Wound Length: (cm) 6 Width: (cm) 12 Depth: (cm) 0.1 Volume: (cm) 5.655 Character of Wound/Ulcer Post Debridement: Improved Severity of Tissue Post Debridement: Fat layer exposed Post Procedure Diagnosis Same as Pre-procedure Electronic Signature(s) Signed: 05/24/2019 1:41:09 PM By: Montey Hora Signed: 05/24/2019 1:53:22 PM By: Worthy Keeler PA-C Entered By: Montey Hora on 05/24/2019 12:51:15 Bonnie Kane ([id number]) -------------------------------------------------------------------------------- Debridement Details Patient Name: Bonnie Kane Date of Service: 05/24/2019 11:45 AM Medical Record Number:  [id number] Patient Account Number: [id number] Date of Birth/Sex: 03-08-1946 (74 y.o. F) Treating RN: Montey Hora Primary Care Provider: Shawnie Dapper Other Clinician: Referring Provider: Shawnie Dapper Treating Provider/Extender: STONE III, Patti Shorb Weeks in Treatment: 0 Debridement Performed for Wound #4 Left,Medial Foot Assessment: Performed By: Physician STONE III, Zell Doucette E., PA-C Debridement Type: Chemical/Enzymatic/Mechanical Agent Used: Saline Severity of Tissue Pre Fat layer exposed Debridement: Level of Consciousness (Pre- Awake and Alert procedure): Pre-procedure Verification/Time Yes - 12:52 Out Taken: Start Time: 12:52 Instrument: Other : saline and gauze Bleeding: None End Time: 12:53 Procedural Pain: 0 Post Procedural Pain: 0 Response to Treatment: Procedure was tolerated  well Level of Consciousness Awake and Alert (Post-procedure): Post Debridement Measurements of Total Wound Length: (cm) 4 Width: (cm) 3 Depth: (cm) 0.1 Volume: (cm) 0.942 Character of Wound/Ulcer Post Debridement: Improved Severity of Tissue Post Debridement: Fat layer exposed Post Procedure Diagnosis Same as Pre-procedure Electronic Signature(s) Signed: 05/24/2019 1:41:09 PM By: Montey Hora Signed: 05/24/2019 1:53:22 PM By: Worthy Keeler PA-C Entered By: Montey Hora on 05/24/2019 12:52:36 Bonnie Kane ([id number]) -------------------------------------------------------------------------------- HPI Details Patient Name: Bonnie Kane Date of Service: 05/24/2019 11:45 AM Medical Record Number: [id number] Patient Account Number: [id number] Date of Birth/Sex: 1945-09-04 (74 y.o. F) Treating RN: Montey Hora Primary Care Provider: Shawnie Dapper Other Clinician: Referring Provider: Shawnie Dapper Treating Provider/Extender: Melburn Hake, Ayme Short Weeks in Treatment: 0 History of Present Illness HPI Description: 05/24/19 patient presents today for initial evaluation here in our clinic as a  referral from Dr. Julien Nordmann who is her family medicine practitioner. He has been taking care of this patient for several weeks now it appears. At least since it appears December 2020 although she has had some issue for quite some time. She has seen the vascular surgeon at [facility] and did have ABIs and TBI's which were performed back at that point. That was in September 2020. With that being said unfortunately she did not show signs of low ABIs and TBI's on the left which is where the blistering is taking place at this point her right seem to be in good shape as far as the results were concerned. She had a right ABI of 1.17 with a TBI of 0.85 and a left ABI of 0.68 with a TBI of 0.43. This is at least moderate disease. The patient does have diabetes although apparently this is diet controlled according to what they tell me at this point. She does also have congestive heart failure, bilateral lower extremity edema with a history of chronic venous stasis, systolic heart murmur, hypertension, and appears to have also had a transcatheter aortic valve replacement. Was on April 2018. The patient has been on doxycycline initially by Dr. Ernestine Conrad and then subsequently this was switched just yesterday to Cipro. He does note in his note yesterday that she had improved but he still recommended that she come to the wound care center for further evaluation and treatment. He did also on top given her the Cipro to cover the Serratia give her Vicodin for helping with the pain he also performed a basic metabolic panel and a CBC yesterday as well. He does have a history of having been recommended compression stockings in the past although apparently this ripped skin off when they remove them according to the patient and her husband. He is present during the evaluation today as well. Is also do CBC which actually appear to be doing fairly well the white blood cell count was 7.0 and appears  normal. The metabolic panel did show an elevation in the BUN and creatinine as well as the ratio at this point her glucose was 231 not be fasting but still that is an elevation range. Right now the patient is taking 40 mg of Lasix in the morning and 20 mg in the evening. He is also get a recheck the metabolic panel in a week. He notes that her kidney function was just a little bit down compared to her baseline. His most recent hemoglobin A1c was 7.2 on October 25, 2018. That was found in care everywhere from [place]. Patient's wound culture was actually  performed just 4 days ago and again did show Serratia as the causative organism for the infection currently and obviously doxycycline is not to work for this that is why she was switched to Cipro. Electronic Signature(s) Signed: 05/24/2019 12:42:07 PM By: Worthy Keeler PA-C Entered By: Worthy Keeler on 05/24/2019 12:42:06 Bonnie Kane ([id number]) -------------------------------------------------------------------------------- Physical Exam Details Patient Name: Bonnie Kane Date of Service: 05/24/2019 11:45 AM Medical Record Number: [id number] Patient Account Number: [id number] Date of Birth/Sex: 1945-05-19 (73 y.o. F) Treating RN: Montey Hora Primary Care Provider: Shawnie Dapper Other Clinician: Referring Provider: Shawnie Dapper Treating Provider/Extender: STONE III, Jayesh Marbach Weeks in Treatment: 0 Constitutional patient is hypertensive.. pulse regular and within target range for patient.Marland Kitchen respirations regular, non-labored and within target range for patient.Marland Kitchen temperature within target range for patient.. Well-nourished and well-hydrated in no acute distress. Eyes conjunctiva clear no eyelid edema noted. pupils equal round and reactive to light and accommodation. Ears, Nose, Mouth, and Throat no gross abnormality of ear auricles or external auditory canals. normal hearing noted during conversation. mucus membranes  moist. Respiratory normal breathing without difficulty. Cardiovascular I was not able to really palpate pulses well due to the fact that she had such significant pain that this was causing issues with being able to palpate a good pulse. I do believe that her arterial study shows evidence that she has issues on the left though the right appear to be normal.. 2+ pitting edema of the bilateral lower extremities. Gastrointestinal (GI) soft, non-tender, non-distended, +BS. no ventral hernia noted. Musculoskeletal unsteady while walking. no significant deformity or arthritic changes, no loss or range of motion, no clubbing. Psychiatric this patient is able to make decisions and demonstrates good insight into disease process. Alert and Oriented x 3. pleasant and cooperative. Notes Patient's wounds currently are mainly blisters that are fluid-filled over the lower extremity unfortunately on the left. These do have some drainage seeping in areas however she has a lot of discomfort I am not going to recommend that we attempt opening these today and clearing them away. We may have to do that in the future. With that being said I do not see any evidence of redness or erythema spreading up the leg at this point. That is good news. She is also on the appropriate antibiotics now which should also make a big difference in my opinion. Electronic Signature(s) Signed: 05/24/2019 1:51:20 PM By: Worthy Keeler PA-C Entered By: Worthy Keeler on 05/24/2019 13:51:19 Bonnie Kane ([id number]) -------------------------------------------------------------------------------- Physician Orders Details Patient Name: Bonnie Kane Date of Service: 05/24/2019 11:45 AM Medical Record Number: [id number] Patient Account Number: [id number] Date of Birth/Sex: 1945-06-27 (73 y.o. F) Treating RN: Montey Hora Primary Care Provider: Shawnie Dapper Other Clinician: Referring Provider: Shawnie Dapper Treating Provider/Extender:  Melburn Hake, Cletis Clack Weeks in Treatment: 0 Verbal / Phone Orders: No Diagnosis Coding ICD-10 Coding Code Description I87.2 Venous insufficiency (chronic) (peripheral) I89.0 Lymphedema, not elsewhere classified I73.89 Other specified peripheral vascular diseases L97.821 Non-pressure chronic ulcer of other part of left lower leg limited to breakdown of skin L97.521 Non-pressure chronic ulcer of other part of left foot limited to breakdown of skin I50.42 Chronic combined systolic (congestive) and diastolic (congestive) heart failure E11.621 Type 2 diabetes mellitus with foot ulcer Wound Cleansing Wound #1 Left Lower Leg o Clean wound with Normal Saline. o Dial antibacterial soap, wash wounds, rinse and pat dry prior to dressing wounds o May Shower, gently pat wound dry prior to applying new dressing. Wound #2 Left,Proximal Lower  Leg o Clean wound with Normal Saline. o Dial antibacterial soap, wash wounds, rinse and pat dry prior to dressing wounds o May Shower, gently pat wound dry prior to applying new dressing. Wound #3 Left,Dorsal Foot o Clean wound with Normal Saline. o Dial antibacterial soap, wash wounds, rinse and pat dry prior to dressing wounds o May Shower, gently pat wound dry prior to applying new dressing. Wound #4 Left,Medial Foot o Clean wound with Normal Saline. o Dial antibacterial soap, wash wounds, rinse and pat dry prior to dressing wounds o May Shower, gently pat wound dry prior to applying new dressing. Wound #5 Left,Lateral Malleolus o Clean wound with Normal Saline. o Dial antibacterial soap, wash wounds, rinse and pat dry prior to dressing wounds o May Shower, gently pat wound dry prior to applying new dressing. Primary Wound Dressing Wound #1 Left Lower Leg o Silver Alginate Wound #2 Left,Proximal Lower Leg o Silver Alginate Bonnie Kane, Bonnie Kane ([id number]) Wound #3 Left,Dorsal Foot o Silver Alginate Wound #4 Left,Medial Foot o  Silver Alginate Wound #5 Left,Lateral Malleolus o Silver Alginate Secondary Dressing Wound #1 Left Lower Leg o ABD and Kerlix/Conform o XtraSorb Wound #2 Left,Proximal Lower Leg o ABD and Kerlix/Conform o XtraSorb Wound #3 Left,Dorsal Foot o ABD and Kerlix/Conform o XtraSorb Wound #4 Left,Medial Foot o ABD and Kerlix/Conform o XtraSorb Wound #5 Left,Lateral Malleolus o ABD and Kerlix/Conform o XtraSorb Dressing Change Frequency Wound #1 Left Lower Leg o Change dressing every other day. Wound #2 Left,Proximal Lower Leg o Change dressing every other day. Wound #3 Left,Dorsal Foot o Change dressing every other day. Wound #4 Left,Medial Foot o Change dressing every other day. Wound #5 Left,Lateral Malleolus o Change dressing every other day. Follow-up Appointments o Return Appointment in 1 week. Edema Control Wound #1 Left Lower Leg o Elevate legs to the level of the heart and pump ankles as often as possible o Other: - TubiGrip F Wound #2 Left,Proximal Lower Leg o Elevate legs to the level of the heart and pump ankles as often as possible Bonnie Kane, Bonnie Kane ([id number]) o Other: - TubiGrip F Wound #3 Left,Dorsal Foot o Elevate legs to the level of the heart and pump ankles as often as possible o Other: - TubiGrip F Wound #4 Left,Medial Foot o Elevate legs to the level of the heart and pump ankles as often as possible o Other: - TubiGrip F Wound #5 Left,Lateral Malleolus o Elevate legs to the level of the heart and pump ankles as often as possible o Other: - TubiGrip F Consults o Vascular - [facility] r/t ABIs from [facility] Patient Medications Allergies: penicillin Notifications Medication Indication Start End Cipro 05/27/2019 DOSE 1 - oral 500 mg tablet - 1 tablet oral taken 2 times a day for 14 days Electronic Signature(s) Signed: 05/27/2019 1:16:22 PM By: Worthy Keeler PA-C Previous Signature:  05/24/2019 1:41:09 PM Version By: Montey Hora Previous Signature: 05/24/2019 1:53:22 PM Version By: Worthy Keeler PA-C Entered By: Worthy Keeler on 05/27/2019 13:16:22 Bonnie Kane ([id number]) -------------------------------------------------------------------------------- Problem List Details Patient Name: Bonnie Kane Date of Service: 05/24/2019 11:45 AM Medical Record Number: [id number] Patient Account Number: [id number] Date of Birth/Sex: 16-Oct-1945 (73 y.o. F) Treating RN: Montey Hora Primary Care Provider: Shawnie Dapper Other Clinician: Referring Provider: Shawnie Dapper Treating Provider/Extender: Melburn Hake, Jammy Stlouis Weeks in Treatment: 0 Active Problems ICD-10 Evaluated Encounter Code Description Active Date Today Diagnosis I87.2 Venous insufficiency (chronic) (peripheral) 05/24/2019 No Yes I89.0 Lymphedema, not elsewhere classified 05/24/2019 No Yes I73.89  Other specified peripheral vascular diseases 05/24/2019 No Yes L97.821 Non-pressure chronic ulcer of other part of left lower leg 05/24/2019 No Yes limited to breakdown of skin L97.521 Non-pressure chronic ulcer of other part of left foot limited to 05/24/2019 No Yes breakdown of skin I50.42 Chronic combined systolic (congestive) and diastolic [id number] No Yes (congestive) heart failure E11.621 Type 2 diabetes mellitus with foot ulcer 05/24/2019 No Yes Inactive Problems Resolved Problems Electronic Signature(s) Signed: 05/24/2019 12:42:50 PM By: Worthy Keeler PA-C Previous Signature: 05/24/2019 12:39:01 PM Version By: Worthy Keeler PA-C Entered By: Worthy Keeler on 05/24/2019 12:42:50 Bonnie Kane ([id number]) Bonnie Kane, Bonnie Kane ([id number]) -------------------------------------------------------------------------------- Progress Note Details Patient Name: Bonnie Kane Date of Service: 05/24/2019 11:45 AM Medical Record Number: [id number] Patient Account Number: [id number] Date of Birth/Sex: 08/04/1945 (73 y.o. F) Treating RN: Montey Hora Primary Care Provider: Shawnie Dapper Other Clinician: Referring Provider: Shawnie Dapper Treating Provider/Extender: Melburn Hake, Benny Deutschman Weeks in Treatment: 0 Subjective Chief Complaint Information obtained from Patient Left LE Blisters and Edema History of Present Illness (HPI) 05/24/19 patient presents today for initial evaluation here in our clinic as a referral from Dr. Julien Nordmann who is her family medicine practitioner. He has been taking care of this patient for several weeks now it appears. At least since it appears December 2020 although she has had some issue for quite some time. She has seen the vascular surgeon at [facility] and did have ABIs and TBI's which were performed back at that point. That was in September 2020. With that being said unfortunately she did not show signs of low ABIs and TBI's on the left which is where the blistering is taking place at this point her right seem to be in good shape as far as the results were concerned. She had a right ABI of 1.17 with a TBI of 0.85 and a left ABI of 0.68 with a TBI of 0.43. This is at least moderate disease. The patient does have diabetes although apparently this is diet controlled according to what they tell me at this point. She does also have congestive heart failure, bilateral lower extremity edema with a history of chronic venous stasis, systolic heart murmur, hypertension, and appears to have also had a transcatheter aortic valve replacement. Was on April 2018. The patient has been on doxycycline initially by Dr. Ernestine Conrad and then subsequently this was switched just yesterday to Cipro. He does note in his note yesterday that she had improved but he still recommended that she come to the wound care center for further evaluation and treatment. He did also on top given her the Cipro to cover the Serratia give her Vicodin for helping with the pain he also performed a basic metabolic panel and a CBC  yesterday as well. He does have a history of having been recommended compression stockings in the past although apparently this ripped skin off when they remove them according to the patient and her husband. He is present during the evaluation today as well. Is also do CBC which actually appear to be doing fairly well the white blood cell count was 7.0 and appears normal. The metabolic panel did show an elevation in the BUN and creatinine as well as the ratio at this point her glucose was 231 not be fasting but still that is an elevation range. Right now the patient is taking 40 mg of Lasix in the morning and 20 mg in the evening. He is also get a recheck the metabolic  panel in a week. He notes that her kidney function was just a little bit down compared to her baseline. His most recent hemoglobin A1c was 7.2 on October 25, 2018. That was found in care everywhere from [place]. Patient's wound culture was actually performed just 4 days ago and again did show Serratia as the causative organism for the infection currently and obviously doxycycline is not to work for this that is why she was switched to Cipro. Patient History Information obtained from Patient. Allergies penicillin Family History No family history of Cancer, Diabetes, Stroke. Social History Former smoker - 30 years ago, Marital Status - Married, Alcohol Use - Never, Drug Use - No History, Caffeine Use - Daily. Medical History Endocrine Patient has history of Type II Diabetes - weight Loss Bonnie Kane, Bonnie Kane ([id number]) Musculoskeletal Patient has history of Osteoarthritis - hips Neurologic Patient has history of Neuropathy - Hands and feet Oncologic Patient has history of Received Chemotherapy, Received Radiation Patient is treated with Controlled Diet. Blood sugar is tested. Blood sugar results noted at the following times: Breakfast - 100. Review of Systems (ROS) Eyes Denies complaints or symptoms of Dry Eyes,  Vision Changes, Glasses / Contacts. Ear/Nose/Mouth/Throat Denies complaints or symptoms of Difficult clearing ears, Sinusitis. Hematologic/Lymphatic Denies complaints or symptoms of Bleeding / Clotting Disorders, Human Immunodeficiency Virus. Respiratory Denies complaints or symptoms of Chronic or frequent coughs, Shortness of Breath. Cardiovascular Denies complaints or symptoms of Chest pain, LE edema. Gastrointestinal Denies complaints or symptoms of Frequent diarrhea, Nausea, Vomiting. Endocrine Denies complaints or symptoms of Hepatitis, Thyroid disease, Polydypsia (Excessive Thirst). Genitourinary Denies complaints or symptoms of Kidney failure/ Dialysis, Incontinence/dribbling. Immunological Denies complaints or symptoms of Hives, Itching. Integumentary (Skin) Complains or has symptoms of Wounds. Denies complaints or symptoms of Bleeding or bruising tendency. Musculoskeletal Denies complaints or symptoms of Muscle Pain, Muscle Weakness. Neurologic Complains or has symptoms of Numbness/parasthesias - hand and feet. Oncologic Breast 2018 Psychiatric Denies complaints or symptoms of Anxiety, Claustrophobia. Objective Constitutional patient is hypertensive.. pulse regular and within target range for patient.Marland Kitchen respirations regular, non-labored and within target range for patient.Marland Kitchen temperature within target range for patient.. Well-nourished and well-hydrated in no acute distress. Vitals Time Taken: 12:44 PM, Height: 64 in, Source: Measured, Weight: 250 lbs, Source: Measured, BMI: 42.9, Temperature: 98.1 F, Pulse: 80 bpm, Respiratory Rate: 18 breaths/min, Blood Pressure: 131/89 mmHg. Eyes conjunctiva clear no eyelid edema noted. pupils equal round and reactive to light and accommodation. Bonnie Kane, Bonnie Kane ([id number]) Ears, Nose, Mouth, and Throat no gross abnormality of ear auricles or external auditory canals. normal hearing noted during conversation. mucus membranes  moist. Respiratory normal breathing without difficulty. Cardiovascular I was not able to really palpate pulses well due to the fact that she had such significant pain that this was causing issues with being able to palpate a good pulse. I do believe that her arterial study shows evidence that she has issues on the left though the right appear to be normal.. 2+ pitting edema of the bilateral lower extremities. Gastrointestinal (GI) soft, non-tender, non-distended, +BS. no ventral hernia noted. Musculoskeletal unsteady while walking. no significant deformity or arthritic changes, no loss or range of motion, no clubbing. Psychiatric this patient is able to make decisions and demonstrates good insight into disease process. Alert and Oriented x 3. pleasant and cooperative. General Notes: Patient's wounds currently are mainly blisters that are fluid-filled over the lower extremity unfortunately on the left. These do have some drainage seeping in areas however she has  a lot of discomfort I am not going to recommend that we attempt opening these today and clearing them away. We may have to do that in the future. With that being said I do not see any evidence of redness or erythema spreading up the leg at this point. That is good news. She is also on the appropriate antibiotics now which should also make a big difference in my opinion. Integumentary (Hair, Skin) Wound #1 status is Open. Original cause of wound was Blister. The wound is located on the Left Lower Leg. The wound measures 4cm length x 5.5cm width x 0.1cm depth; 17.279cm^2 area and 1.728cm^3 volume. There is Fat Layer (Subcutaneous Tissue) Exposed exposed. There is no tunneling or undermining noted. There is a large amount of serosanguineous drainage noted. There is small (1-33%) pink granulation within the wound bed. There is a large (67-100%) amount of necrotic tissue within the wound bed including Adherent Slough. Wound #2 status is  Open. Original cause of wound was Blister. The wound is located on the Left,Proximal Lower Leg. The wound measures 1.2cm length x 2cm width x 0.1cm depth; 1.885cm^2 area and 0.188cm^3 volume. There is Fat Layer (Subcutaneous Tissue) Exposed exposed. There is no tunneling or undermining noted. There is a medium amount of serosanguineous drainage noted. There is small (1-33%) pink granulation within the wound bed. There is a large (67-100%) amount of necrotic tissue within the wound bed including Adherent Slough. Wound #3 status is Open. Original cause of wound was Blister. The wound is located on the Left,Dorsal Foot. The wound measures 6cm length x 12cm width x 0.1cm depth; 56.549cm^2 area and 5.655cm^3 volume. There is Fat Layer (Subcutaneous Tissue) Exposed exposed. There is no tunneling or undermining noted. There is a medium amount of serosanguineous drainage noted. There is small (1-33%) pink granulation within the wound bed. There is a large (67-100%) amount of necrotic tissue within the wound bed including Eschar and Adherent Slough. Wound #4 status is Open. Original cause of wound was Blister. The wound is located on the Left,Medial Foot. The wound measures 4cm length x 3cm width x 0.1cm depth; 9.425cm^2 area and 0.942cm^3 volume. There is Fat Layer (Subcutaneous Tissue) Exposed exposed. There is no tunneling or undermining noted. There is a medium amount of serosanguineous drainage noted. There is medium (34-66%) pink granulation within the wound bed. There is a medium (34-66%) amount of necrotic tissue within the wound bed including Eschar and Adherent Slough. Wound #5 status is Open. Original cause of wound was Blister. The wound is located on the Left,Lateral Malleolus. The wound measures 2cm length x 2.2cm width x 0.1cm depth; 3.456cm^2 area and 0.346cm^3 volume. There is Fat Layer (Subcutaneous Tissue) Exposed exposed. There is no tunneling or undermining noted. There is a medium  amount of serosanguineous drainage noted. There is no granulation within the wound bed. There is a large (67-100%) amount of Bonnie Kane, Bonnie Kane ([id number]) necrotic tissue within the wound bed including Eschar. 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 There was a Chemical/Enzymatic/Mechanical debridement performed by STONE III, Josselyn Harkins E.,  PA-C. With the following instrument(s): saline and gauze. Other agent used was Saline. A time out was conducted at 12:50, prior to the start of the procedure. There was no bleeding. The procedure was tolerated well with a pain level of 0 throughout and a pain level of 0 following the procedure. Post Debridement Measurements: 4cm length x 5.5cm width x 0.1cm depth; 1.728cm^3 volume. Character of Wound/Ulcer Post Debridement is improved. Post procedure Diagnosis Wound #1: Same as Pre-Procedure Wound #3 Pre-procedure diagnosis of Wound #3 is a Venous Leg Ulcer located on the Left,Dorsal Foot .Severity of Tissue Pre Debridement is: Fat layer exposed. There was a Chemical/Enzymatic/Mechanical debridement performed by STONE III, Eston Heslin E., PA-C. With the following instrument(s): saline and gauze. Other agent used was Saline. A time out was conducted at 12:51, prior to the start of the procedure. There was no bleeding. The procedure was tolerated well with a pain level of 0 throughout and a pain level of 0 following the procedure. Post Debridement Measurements:  6cm length x 12cm width x 0.1cm depth; 5.655cm^3 volume. Character of Wound/Ulcer Post Debridement is improved. Severity of Tissue Post Debridement is: Fat layer exposed. Post procedure Diagnosis Wound #3: Same as Pre-Procedure Wound #4 Pre-procedure diagnosis of Wound #4 is a Venous Leg Ulcer located on the Left,Medial Foot .Severity of Tissue Pre Debridement is: Fat layer exposed. There was a Chemical/Enzymatic/Mechanical debridement performed by STONE III, Hason Ofarrell E., PA-C. With the following instrument(s): saline and gauze. Other agent used was Saline. A time out was conducted at 12:52, prior to the start of the procedure. There was no bleeding. The procedure was tolerated well with a pain level of 0 throughout and a pain level of 0 following the procedure. Post Debridement Measurements: 4cm length x 3cm width x 0.1cm depth; 0.942cm^3 volume. Character of Wound/Ulcer Post Debridement is improved. Severity of Tissue Post Debridement is: Fat layer exposed. Post procedure Diagnosis Wound #4: Same as Pre-Procedure Bonnie Kane, Bonnie Kane ([id number]) Plan Wound Cleansing: Wound #1 Left Lower Leg: Clean wound with Normal Saline. Dial antibacterial soap, wash wounds, rinse and pat dry prior to dressing wounds May Shower, gently pat wound dry prior to applying new dressing. Wound #2 Left,Proximal Lower Leg: Clean wound with Normal Saline. Dial antibacterial soap, wash wounds, rinse and pat dry prior to dressing wounds May Shower, gently pat wound dry prior to applying new dressing. Wound #3 Left,Dorsal Foot: Clean wound with Normal Saline. Dial antibacterial soap, wash wounds, rinse and pat dry prior to dressing wounds May Shower, gently pat wound dry prior to applying new dressing. Wound #4 Left,Medial Foot: Clean wound with Normal Saline. Dial antibacterial soap, wash wounds, rinse and pat dry prior to dressing wounds May Shower, gently pat wound dry prior to applying new dressing. Wound #5  Left,Lateral Malleolus: Clean wound with Normal Saline. Dial antibacterial soap, wash wounds, rinse and pat dry prior to dressing wounds May Shower, gently pat wound dry prior to applying new dressing. Primary Wound Dressing: Wound #1 Left Lower Leg: Silver Alginate Wound #2 Left,Proximal Lower Leg: Silver Alginate Wound #3 Left,Dorsal Foot: Silver Alginate Wound #4 Left,Medial Foot: Silver Alginate Wound #5 Left,Lateral Malleolus: Silver Alginate Secondary Dressing: Wound #1 Left Lower Leg: ABD and Kerlix/Conform XtraSorb Wound #2 Left,Proximal Lower Leg: ABD and Kerlix/Conform XtraSorb Wound #3 Left,Dorsal Foot: ABD and Kerlix/Conform XtraSorb Wound #4 Left,Medial Foot: ABD and Kerlix/Conform XtraSorb Wound #5 Left,Lateral Malleolus: ABD and Kerlix/Conform XtraSorb Dressing Change Frequency: Wound #1 Left Lower Leg: Change dressing every other day. Wound #  2 Left,Proximal Lower Leg: Change dressing every other day. Wound #3 Left,Dorsal Foot: Bonnie Kane, Bonnie Kane ([id number]) Change dressing every other day. Wound #4 Left,Medial Foot: Change dressing every other day. Wound #5 Left,Lateral Malleolus: Change dressing every other day. Follow-up Appointments: Return Appointment in 1 week. Edema Control: Wound #1 Left Lower Leg: Elevate legs to the level of the heart and pump ankles as often as possible Other: - TubiGrip F Wound #2 Left,Proximal Lower Leg: Elevate legs to the level of the heart and pump ankles as often as possible Other: - TubiGrip F Wound #3 Left,Dorsal Foot: Elevate legs to the level of the heart and pump ankles as often as possible Other: - TubiGrip F Wound #4 Left,Medial Foot: Elevate legs to the level of the heart and pump ankles as often as possible Other: - TubiGrip F Wound #5 Left,Lateral Malleolus: Elevate legs to the level of the heart and pump ankles as often as possible Other: - TubiGrip F Consults ordered were: Vascular - [facility] r/t ABIs from [facility] The following medication(s) was prescribed: Cipro oral 500 mg tablet 1 1 tablet oral taken 2 times a day for 14 days starting 05/27/2019 1. My suggestion currently is good to be that we initiate silver cell as the treatment of choice for the areas of the blister this will absorb we will use ABD pads over top. 2. I would like to actually compression wrap her but she is much to painful to touch as well as having low arterial flow therefore we can avoid that we will use Tubigrip after securing lightly with Kerlix. 3. I am going also recommend referral to vein and vascular in [facility] in order to have a provider referral and appointment there to see if there is anything going on that they can help with with regard to the poor arterial flow into the left lower extremity once that is under control she also wants to discuss with him options for her venous flow and improvement in that. 4. I do recommend that she continue to take the Cipro as prescribed by her primary care provider that does seem to be appropriate for the Serratia which was noted on culture. We will see patient back for reevaluation in 1 week here in the clinic. If anything worsens or changes patient will contact our office for additional recommendations. Electronic Signature(s) Signed: 05/27/2019 1:16:34 PM By: Worthy Keeler PA-C Previous Signature: 05/24/2019 1:52:23 PM Version By: Worthy Keeler PA-C Entered By: Worthy Keeler on 05/27/2019 13:16:33 Bonnie Kane ([id number]) -------------------------------------------------------------------------------- ROS/PFSH Details Patient Name: Bonnie Kane Date of Service: 05/24/2019 11:45 AM Medical Record Number: [id number] Patient Account Number: [id number] Date of Birth/Sex: 01-23-1946 (73 y.o. F) Treating RN: Cornell Barman Primary Care Provider: Shawnie Dapper Other Clinician: Referring Provider: Shawnie Dapper Treating Provider/Extender: STONE III,  Sophia Sperry Weeks in Treatment: 0 Information Obtained From Patient Eyes Complaints and Symptoms: Negative for: Dry Eyes; Vision Changes; Glasses / Contacts Ear/Nose/Mouth/Throat Complaints and Symptoms: Negative for: Difficult clearing ears; Sinusitis Hematologic/Lymphatic Complaints and Symptoms: Negative for: Bleeding / Clotting Disorders; Human Immunodeficiency Virus Respiratory Complaints and Symptoms: Negative for: Chronic or frequent coughs; Shortness of Breath Cardiovascular Complaints and Symptoms: Negative for: Chest pain; LE edema Gastrointestinal Complaints and Symptoms: Negative for: Frequent diarrhea; Nausea; Vomiting Endocrine Complaints and Symptoms: Negative for: Hepatitis; Thyroid disease; Polydypsia (Excessive Thirst) Medical History: Positive for: Type II Diabetes - weight Loss Time with diabetes: 10 years Treated with: Diet Blood sugar tested every day: Yes Tested :  Blood sugar testing results: Breakfast: 100 Genitourinary Bonnie Kane, Bonnie Kane ([id number]) Complaints and Symptoms: Negative for: Kidney failure/ Dialysis; Incontinence/dribbling Immunological Complaints and Symptoms: Negative for: Hives; Itching Integumentary (Skin) Complaints and Symptoms: Positive for: Wounds Negative for: Bleeding or bruising tendency Musculoskeletal Complaints and Symptoms: Negative for: Muscle Pain; Muscle Weakness Medical History: Positive for: Osteoarthritis - hips Neurologic Complaints and Symptoms: Positive for: Numbness/parasthesias - hand and feet Medical History: Positive for: Neuropathy - Hands and feet Psychiatric Complaints and Symptoms: Negative for: Anxiety; Claustrophobia Constitutional Symptoms (General Health) Oncologic Complaints and Symptoms: Review of System Notes: Breast 2018 Medical History: Positive for: Received Chemotherapy; Received Radiation Immunizations Pneumococcal Vaccine: Received Pneumococcal Vaccination: Yes Implantable  Devices Yes Family and Social History Cancer: No; Diabetes: No; Stroke: No; Former smoker - 30 years ago; Marital Status - Married; Alcohol Use: Never; Drug Use: No History; Caffeine Use: Daily Electronic Signature(s) Bonnie Kane, Bonnie Kane ([id number]) Signed: 05/24/2019 1:53:22 PM By: Worthy Keeler PA-C Signed: 05/24/2019 3:19:26 PM By: Gretta Cool BSN, RN, CWS, Kim RN, BSN Entered By: Gretta Cool, BSN, RN, CWS, Kim on 05/24/2019 12:01:02 Bonnie Kane ([id number]) -------------------------------------------------------------------------------- SuperBill Details Patient Name: Bonnie Kane Date of Service: 05/24/2019 Medical Record Number: [id number] Patient Account Number: [id number] Date of Birth/Sex: 1945/08/09 (73 y.o. F) Treating RN: Montey Hora Primary Care Provider: Shawnie Dapper Other Clinician: Referring Provider: Shawnie Dapper Treating Provider/Extender: Melburn Hake, Nilesh Stegall Weeks in Treatment: 0 Diagnosis Coding ICD-10 Codes Code Description I87.2 Venous insufficiency (chronic) (peripheral) I89.0 Lymphedema, not elsewhere classified I73.89 Other specified peripheral vascular diseases L97.821 Non-pressure chronic ulcer of other part of left lower leg limited to breakdown of skin L97.521 Non-pressure chronic ulcer of other part of left foot limited to breakdown of skin I50.42 Chronic combined systolic (congestive) and diastolic (congestive) heart failure E11.621 Type 2 diabetes mellitus with foot ulcer Facility Procedures CPT4 Code: [id number] Description: [id number] - WOUND CARE VISIT-LEV 5 EST PT Modifier: Quantity: 1 Physician Procedures CPT4 Code Description: [id number] - WC PHYS LEVEL 4 - NEW PT ICD-10 Diagnosis Description I87.2 Venous insufficiency (chronic) (peripheral) I89.0 Lymphedema, not elsewhere classified I73.89 Other specified peripheral vascular diseases L97.821  Non-pressure chronic ulcer of other part of left lower leg limi Modifier: ted to breakdown Quantity: 1 of  skin Electronic Signature(s) Signed: 05/24/2019 1:52:45 PM By: Worthy Keeler PA-C Previous Signature: 05/24/2019 1:41:09 PM Version By: Montey Hora Entered By: Worthy Keeler on 05/24/2019 13:52:45

## 2019-05-24 NOTE — Progress Notes (Signed)
CORA, PHAUP ([id number]) Visit Report for 05/24/2019 Abuse/Suicide Risk Screen Details Patient Name: Bonnie Kane, Bonnie Kane Date of Service: 05/24/2019 11:45 AM Medical Record Number: [id number] Patient Account Number: [id number] Date of Birth/Sex: 08/02/1945 (74 y.o. F) Treating RN: Cornell Barman Primary Care Landen Breeland: Shawnie Dapper Other Clinician: Referring Kaitrin Seybold: Shawnie Dapper Treating Jaeden Westbay/Extender: STONE III, HOYT Weeks in Treatment: 0 Abuse/Suicide Risk Screen Items Answer ABUSE RISK SCREEN: Has anyone close to you tried to hurt or harm you recentlyo No Do you feel uncomfortable with anyone in your familyo No Has anyone forced you do things that you didnot want to doo No Electronic Signature(s) Signed: 05/24/2019 3:19:26 PM By: Gretta Cool, BSN, RN, CWS, Kim RN, BSN Entered By: Gretta Cool, BSN, RN, CWS, Kim on 05/24/2019 12:01:14 Bonnie Kane ([id number]) -------------------------------------------------------------------------------- Activities of Daily Living Details Patient Name: Bonnie Kane Date of Service: 05/24/2019 11:45 AM Medical Record Number: [id number] Patient Account Number: [id number] Date of Birth/Sex: 04-23-1945 (74 y.o. F) Treating RN: Cornell Barman Primary Care Ellina Sivertsen: Shawnie Dapper Other Clinician: Referring Anai Lipson: Shawnie Dapper Treating Pier Laux/Extender: Melburn Hake, HOYT Weeks in Treatment: 0 Activities of Daily Living Items Answer Activities of Daily Living (Please select one for each item) Drive Automobile Completely Able Take Medications Completely Able Use Telephone Completely Able Care for Appearance Completely Able Use Toilet Completely Able Bath / Shower Completely Able Dress Self Completely Able Feed Self Completely Able Walk Completely Able Get In / Out Bed Completely Able Housework Completely Able Prepare Meals Completely Able Handle Money Completely Able Shop for Self Completely Able Electronic Signature(s) Signed: 05/24/2019 3:19:26 PM By: Gretta Cool,  BSN, RN, CWS, Kim RN, BSN Entered By: Gretta Cool, BSN, RN, CWS, Kim on 05/24/2019 12:01:39 Bonnie Kane ([id number]) -------------------------------------------------------------------------------- Education Screening Details Patient Name: Bonnie Kane Date of Service: 05/24/2019 11:45 AM Medical Record Number: [id number] Patient Account Number: [id number] Date of Birth/Sex: 08-23-1945 (74 y.o. F) Treating RN: Cornell Barman Primary Care Alliana Mcauliff: Shawnie Dapper Other Clinician: Referring Azjah Pardo: Shawnie Dapper Treating Zoe Goonan/Extender: Melburn Hake, HOYT Weeks in Treatment: 0 Primary Learner Assessed: Patient Learning Preferences/Education Level/Primary Language Learning Preference: Explanation Highest Education Level: College or Above Preferred Language: English Cognitive Barrier Language Barrier: No Translator Needed: No Memory Deficit: No Emotional Barrier: No Physical Barrier Impaired Vision: No Impaired Hearing: Yes hard of hearing Decreased Hand dexterity: No Knowledge/Comprehension Knowledge Level: Medium Comprehension Level: Medium Ability to understand written Medium instructions: Ability to understand verbal Medium instructions: Motivation Anxiety Level: Calm Cooperation: Cooperative Education Importance: Acknowledges Need Interest in Health Problems: Uninterested Perception: Coherent Willingness to Engage in Self- High Management Activities: Readiness to Engage in Self- High Management Activities: Engineer, maintenance) Signed: 05/24/2019 3:19:26 PM By: Gretta Cool, BSN, RN, CWS, Kim RN, BSN Entered By: Gretta Cool, BSN, RN, CWS, Kim on 05/24/2019 12:02:14 Bonnie Kane ([id number]) -------------------------------------------------------------------------------- Fall Risk Assessment Details Patient Name: Bonnie Kane Date of Service: 05/24/2019 11:45 AM Medical Record Number: [id number] Patient Account Number: [id number] Date of Birth/Sex: 1946-04-10 (74 y.o. F) Treating RN:  Cornell Barman Primary Care Karnisha Lefebre: Shawnie Dapper Other Clinician: Referring Tiffiany Beadles: Shawnie Dapper Treating Veer Elamin/Extender: Melburn Hake, HOYT Weeks in Treatment: 0 Fall Risk Assessment Items Have you had 2 or more falls in the last 12 monthso 0 No Have you had any fall that resulted in injury in the last 12 monthso 0 No FALLS RISK SCREEN History of falling - immediate or within 3 months 0 No Secondary diagnosis (Do you have 2 or more medical diagnoseso) 15 Yes Ambulatory aid None/bed rest/wheelchair/nurse 0 No Crutches/cane/walker 15 Yes Furniture 0 No Intravenous therapy  Access/Saline/Heparin Lock 0 No Gait/Transferring Normal/ bed rest/ wheelchair 0 No Weak (short steps with or without shuffle, stooped but able to lift head while 10 Yes walking, may seek support from furniture) Impaired (short steps with shuffle, may have difficulty arising from chair, head 0 No down, impaired balance) Mental Status Oriented to own ability 0 No Electronic Signature(s) Signed: 05/24/2019 3:19:26 PM By: Gretta Cool, BSN, RN, CWS, Kim RN, BSN Entered By: Gretta Cool, BSN, RN, CWS, Kim on 05/24/2019 12:02:46 Bonnie Kane ([id number]) -------------------------------------------------------------------------------- Nutrition Risk Screening Details Patient Name: Bonnie Kane Date of Service: 05/24/2019 11:45 AM Medical Record Number: [id number] Patient Account Number: [id number] Date of Birth/Sex: 1946-01-16 (74 y.o. F) Treating RN: Cornell Barman Primary Care Mark Benecke: Shawnie Dapper Other Clinician: Referring Dian Minahan: Shawnie Dapper Treating Curran Lenderman/Extender: STONE III, HOYT Weeks in Treatment: 0 Height (in): Weight (lbs): Body Mass Index (BMI): Nutrition Risk Screening Items Score Screening NUTRITION RISK SCREEN: I have an illness or condition that made me change the kind and/or amount of 0 No food I eat I eat fewer than two meals per day 3 Yes I eat few fruits and vegetables, or milk products 0  No I have three or more drinks of beer, liquor or wine almost every day 0 No I have tooth or mouth problems that make it hard for me to eat 0 No I don't always have enough money to buy the food I need 0 No I eat alone most of the time 0 No I take three or more different prescribed or over-the-counter drugs a day 1 Yes Without wanting to, I have lost or gained 10 pounds in the last six months 0 No I am not always physically able to shop, cook and/or feed myself 0 No Nutrition Protocols Good Risk Protocol 0 No interventions needed Moderate Risk Protocol High Risk Proctocol Risk Level: Moderate Risk Score: 4 Electronic Signature(s) Signed: 05/24/2019 3:19:26 PM By: Gretta Cool, BSN, RN, CWS, Kim RN, BSN Entered By: Gretta Cool, BSN, RN, CWS, Kim on 05/24/2019 12:03:08

## 2019-05-24 NOTE — Progress Notes (Signed)
Lab orders placed for bmet.

## 2019-05-24 NOTE — Progress Notes (Signed)
ADWOA, PRESTWOOD ([id number]) Visit Report for 05/24/2019 Allergy List Details Patient Name: Bonnie Kane, Bonnie Kane Date of Service: 05/24/2019 11:45 AM Medical Record Number: [id number] Patient Account Number: [id number] Date of Birth/Sex: Aug 28, 1945 (74 y.o. F) Treating RN: Cornell Barman Primary Care Asim Gersten: Shawnie Dapper Other Clinician: Referring Johnatha Zeidman: Shawnie Dapper Treating Lucile Didonato/Extender: Melburn Hake, HOYT Weeks in Treatment: 0 Allergies Active Allergies penicillin Allergy Notes Electronic Signature(s) Signed: 05/24/2019 3:19:26 PM By: Gretta Cool, BSN, RN, CWS, Kim RN, BSN Entered By: Gretta Cool, BSN, RN, CWS, Kim on 05/24/2019 11:54:55 Bonnie Kane ([id number]) -------------------------------------------------------------------------------- Arrival Information Details Patient Name: Bonnie Kane Date of Service: 05/24/2019 11:45 AM Medical Record Number: [id number] Patient Account Number: [id number] Date of Birth/Sex: Jan 22, 1946 (73 y.o. F) Treating RN: Cornell Barman Primary Care Khole Branch: Shawnie Dapper Other Clinician: Referring Shasha Buchbinder: Shawnie Dapper Treating Masson Nalepa/Extender: Melburn Hake, HOYT Weeks in Treatment: 0 Visit Information Patient Arrived: Cane Arrival Time: 11:52 Accompanied By: Husband Transfer Assistance: None Patient Identification Verified: Yes Secondary Verification Process Completed: Yes Electronic Signature(s) Signed: 05/24/2019 3:19:26 PM By: Gretta Cool, BSN, RN, CWS, Kim RN, BSN Entered By: Gretta Cool, BSN, RN, CWS, Kim on 05/24/2019 11:53:44 Bonnie Kane ([id number]) -------------------------------------------------------------------------------- Clinic Level of Care Assessment Details Patient Name: Bonnie Kane Date of Service: 05/24/2019 11:45 AM Medical Record Number: [id number] Patient Account Number: [id number] Date of Birth/Sex: 1946-01-29 (73 y.o. F) Treating RN: Montey Hora Primary Care Vada Swift: Shawnie Dapper Other Clinician: Referring Christabel Camire: Shawnie Dapper Treating  Lizzeth Meder/Extender: STONE III, HOYT Weeks in Treatment: 0 Clinic Level of Care Assessment Items TOOL 2 Quantity Score []  - Use when only an EandM is performed on the INITIAL visit 0 ASSESSMENTS - Nursing Assessment / Reassessment X - General Physical Exam (combine w/ comprehensive assessment (listed just below) when 1 20 performed on new pt. evals) X- 1 25 Comprehensive Assessment (HX, ROS, Risk Assessments, Wounds Hx, etc.) ASSESSMENTS - Wound and Skin Assessment / Reassessment []  - Simple Wound Assessment / Reassessment - one wound 0 X- 5 5 Complex Wound Assessment / Reassessment - multiple wounds []  - 0 Dermatologic / Skin Assessment (not related to wound area) ASSESSMENTS - Ostomy and/or Continence Assessment and Care []  - Incontinence Assessment and Management 0 []  - 0 Ostomy Care Assessment and Management (repouching, etc.) PROCESS - Coordination of Care X - Simple Patient / Family Education for ongoing care 1 15 []  - 0 Complex (extensive) Patient / Family Education for ongoing care X- 1 10 Staff obtains Programmer, systems, Records, Test Results / Process Orders []  - 0 Staff telephones HHA, Nursing Homes / Clarify orders / etc []  - 0 Routine Transfer to another Facility (non-emergent condition) []  - 0 Routine Hospital Admission (non-emergent condition) X- 1 15 New Admissions / Biomedical engineer / Ordering NPWT, Apligraf, etc. []  - 0 Emergency Hospital Admission (emergent condition) X- 1 10 Simple Discharge Coordination []  - 0 Complex (extensive) Discharge Coordination PROCESS - Special Needs []  - Pediatric / Minor Patient Management 0 []  - 0 Isolation Patient Management Bourcier, Opal Sidles ([id number]) []  - 0 Hearing / Language / Visual special needs []  - 0 Assessment of Community assistance (transportation, D/C planning, etc.) []  - 0 Additional assistance / Altered mentation []  - 0 Support Surface(s) Assessment (bed, cushion, seat, etc.) INTERVENTIONS - Wound Cleansing  / Measurement X - Wound Imaging (photographs - any number of wounds) 1 5 []  - 0 Wound Tracing (instead of photographs) []  - 0 Simple Wound Measurement - one wound X- 5 5 Complex Wound Measurement - multiple wounds []  - 0 Simple Wound Cleansing - one  wound X- 5 5 Complex Wound Cleansing - multiple wounds INTERVENTIONS - Wound Dressings []  - Small Wound Dressing one or multiple wounds 0 []  - 0 Medium Wound Dressing one or multiple wounds X- 1 20 Large Wound Dressing one or multiple wounds []  - 0 Application of Medications - injection INTERVENTIONS - Miscellaneous []  - External ear exam 0 []  - 0 Specimen Collection (cultures, biopsies, blood, body fluids, etc.) []  - 0 Specimen(s) / Culture(s) sent or taken to Lab for analysis []  - 0 Patient Transfer (multiple staff / Civil Service fast streamer / Similar devices) []  - 0 Simple Staple / Suture removal (25 or less) []  - 0 Complex Staple / Suture removal (26 or more) []  - 0 Hypo / Hyperglycemic Management (close monitor of Blood Glucose) []  - 0 Ankle / Brachial Index (ABI) - do not check if billed separately Has the patient been seen at the hospital within the last three years: Yes Total Score: 195 Level Of Care: New/Established - Level 5 Electronic Signature(s) Signed: 05/24/2019 1:41:09 PM By: Montey Hora Entered By: Montey Hora on 05/24/2019 12:53:22 Bonnie Kane ([id number]) -------------------------------------------------------------------------------- Encounter Discharge Information Details Patient Name: Bonnie Kane Date of Service: 05/24/2019 11:45 AM Medical Record Number: [id number] Patient Account Number: [id number] Date of Birth/Sex: 12/19/1945 (74 y.o. F) Treating RN: Montey Hora Primary Care Calton Harshfield: Shawnie Dapper Other Clinician: Referring Riata Ikeda: Shawnie Dapper Treating Camellia Popescu/Extender: STONE III, HOYT Weeks in Treatment: 0 Encounter Discharge Information Items Post Procedure Vitals Discharge Condition:  Stable Temperature (F): 98.1 Ambulatory Status: Cane Pulse (bpm): 80 Discharge Destination: Home Respiratory Rate (breaths/min): 16 Transportation: Private Auto Blood Pressure (mmHg): 131/89 Accompanied By: spouse Schedule Follow-up Appointment: Yes Clinical Summary of Care: Electronic Signature(s) Signed: 05/24/2019 1:41:09 PM By: Montey Hora Entered By: Montey Hora on 05/24/2019 12:59:38 Bonnie Kane ([id number]) -------------------------------------------------------------------------------- Lower Extremity Assessment Details Patient Name: Bonnie Kane Date of Service: 05/24/2019 11:45 AM Medical Record Number: [id number] Patient Account Number: [id number] Date of Birth/Sex: April 23, 1945 (73 y.o. F) Treating RN: Cornell Barman Primary Care Deziray Nabi: Shawnie Dapper Other Clinician: Referring Lerry Cordrey: Shawnie Dapper Treating Maycie Luera/Extender: Melburn Hake, HOYT Weeks in Treatment: 0 Edema Assessment Assessed: [Left: No] [Right: No] Edema: [Left: Yes] [Right: Yes] Calf Left: Right: Point of Measurement: 29 cm From Medial Instep 49 cm 47.5 cm Ankle Left: Right: Point of Measurement: 10 cm From Medial Instep 29 cm 27.7 cm Vascular Assessment Pulses: Dorsalis Pedis Palpable: [Left:No] [Right:Yes] Doppler Audible: [Left:Inaudible] Posterior Tibial Palpable: [Left:Yes Yes] Electronic Signature(s) Signed: 05/24/2019 3:19:26 PM By: Gretta Cool, BSN, RN, CWS, Kim RN, BSN Entered By: Gretta Cool, BSN, RN, CWS, Kim on 05/24/2019 12:28:02 Bonnie Kane ([id number]) -------------------------------------------------------------------------------- Multi Wound Chart Details Patient Name: Bonnie Kane Date of Service: 05/24/2019 11:45 AM Medical Record Number: [id number] Patient Account Number: [id number] Date of Birth/Sex: 1945/12/28 (73 y.o. F) Treating RN: Montey Hora Primary Care Zeta Bucy: Shawnie Dapper Other Clinician: Referring Yaritzy Huser: Shawnie Dapper Treating Jhan Conery/Extender: STONE III,  HOYT Weeks in Treatment: 0 Vital Signs Height(in): 64 Pulse(bpm): 80 Weight(lbs): 250 Blood Pressure(mmHg): 131/89 Body Mass Index(BMI): 43 Temperature(F): Respiratory Rate 18 (breaths/min): Photos: Wound Location: Left Lower Leg Left Lower Leg - Proximal Left Foot - Dorsal Wounding Event: Blister Blister Blister Primary Etiology: Trauma, Other Trauma, Other Venous Leg Ulcer Comorbid History: Type II Diabetes, Type II Diabetes, Type II Diabetes, Osteoarthritis, Neuropathy, Osteoarthritis, Neuropathy, Osteoarthritis, Neuropathy, Received Chemotherapy, Received Chemotherapy, Received Chemotherapy, Received Radiation Received Radiation Received Radiation Date Acquired: 05/01/2019 05/01/2019 05/01/2019 Weeks of Treatment: 0 0 0 Wound Status: Open Open Open Measurements L x W  x D 4x5.5x0.1 1.2x2x0.1 6x12x0.1 (cm) Area (cm) : 17.279 1.885 56.549 Volume (cm) : 1.728 0.188 5.655 % Reduction in Area: 0.00% 0.00% 0.00% % Reduction in Volume: 0.00% 0.00% 0.00% Classification: Full Thickness Without Full Thickness Without Full Thickness Without Exposed Support Structures Exposed Support Structures Exposed Support Structures Exudate Amount: Large Medium Medium Exudate Type: Serosanguineous Serosanguineous Serosanguineous Exudate Color: red, brown red, brown red, brown Granulation Amount: Small (1-33%) Small (1-33%) Small (1-33%) Granulation Quality: Pink Pink Pink Necrotic Amount: Large (67-100%) Large (67-100%) Large (67-100%) Necrotic Tissue: Adherent Shelburn, Adherent Slough Exposed Structures: Fat Layer (Subcutaneous Fat Layer (Subcutaneous Fat Layer (Subcutaneous Tissue) Exposed: Yes Tissue) Exposed: Yes Tissue) Exposed: Yes Fascia: No Fascia: No Fascia: No Tendon: No Tendon: No Tendon: No Strehle, Quincee ([id number]) Muscle: No Muscle: No Muscle: No Joint: No Joint: No Joint: No Bone: No Bone: No Bone: No Epithelialization: None None None Wound  Number: 4 5 N/A Photos: N/A Wound Location: Left Foot - Medial Left Malleolus - Lateral N/A Wounding Event: Blister Blister N/A Primary Etiology: Venous Leg Ulcer Venous Leg Ulcer N/A Comorbid History: Type II Diabetes, Type II Diabetes, N/A Osteoarthritis, Neuropathy, Osteoarthritis, Neuropathy, Received Chemotherapy, Received Chemotherapy, Received Radiation Received Radiation Date Acquired: 05/01/2019 05/01/2019 N/A Weeks of Treatment: 0 0 N/A Wound Status: Open Open N/A Measurements L x W x D 4x3x0.1 2x2.2x0.1 N/A (cm) Area (cm) : 9.425 3.456 N/A Volume (cm) : 0.942 0.346 N/A % Reduction in Area: 0.00% 0.00% N/A % Reduction in Volume: 0.00% 0.00% N/A Classification: Full Thickness Without Full Thickness Without N/A Exposed Support Structures Exposed Support Structures Exudate Amount: Medium Medium N/A Exudate Type: Serosanguineous Serosanguineous N/A Exudate Color: red, brown red, brown N/A Granulation Amount: Medium (34-66%) None Present (0%) N/A Granulation Quality: Pink N/A N/A Necrotic Amount: Medium (34-66%) Large (67-100%) N/A Necrotic Tissue: Eschar, Adherent Slough Eschar N/A Exposed Structures: Fat Layer (Subcutaneous Fat Layer (Subcutaneous N/A Tissue) Exposed: Yes Tissue) Exposed: Yes Fascia: No Fascia: No Tendon: No Tendon: No Muscle: No Muscle: No Joint: No Joint: No Bone: No Bone: No Epithelialization: None None N/A Treatment Notes Electronic Signature(s) Signed: 05/24/2019 1:41:09 PM By: Montey Hora Entered By: Montey Hora on 05/24/2019 12:46:26 Bonnie Kane ([id number]) -------------------------------------------------------------------------------- Multi-Disciplinary Care Plan Details Patient Name: Bonnie Kane Date of Service: 05/24/2019 11:45 AM Medical Record Number: [id number] Patient Account Number: [id number] Date of Birth/Sex: 12/10/1945 (74 y.o. F) Treating RN: Montey Hora Primary Care Michiah Mudry: Shawnie Dapper Other  Clinician: Referring Junita Kubota: Shawnie Dapper Treating Glenna Brunkow/Extender: Melburn Hake, HOYT Weeks in Treatment: 0 Active Inactive Abuse / Safety / Falls / Self Care Management Nursing Diagnoses: Potential for falls Goals: Patient will remain injury free related to falls Date Initiated: 05/24/2019 Target Resolution Date: 08/24/2019 Goal Status: Active Interventions: Assess fall risk on admission and as needed Notes: Orientation to the Wound Care Program Nursing Diagnoses: Knowledge deficit related to the wound healing center program Goals: Patient/caregiver will verbalize understanding of the [place] Program Date Initiated: 05/24/2019 Target Resolution Date: 08/24/2019 Goal Status: Active Interventions: Provide education on orientation to the wound center Notes: Pain, Acute or Chronic Nursing Diagnoses: Pain, acute or chronic: actual or potential Goals: Patient/caregiver will verbalize adequate pain control between visits Date Initiated: 05/24/2019 Target Resolution Date: 08/24/2019 Goal Status: Active Interventions: Complete pain assessment as per visit requirements Mandelbaum, Opal Sidles ([id number]) Notes: Venous Leg Ulcer Nursing Diagnoses: Potential for venous Insuffiency (use before diagnosis confirmed) Goals: Patient will maintain optimal edema control Date Initiated: 05/24/2019 Target Resolution Date: 08/24/2019 Goal  Status: Active Interventions: Assess peripheral edema status every visit. Notes: Wound/Skin Impairment Nursing Diagnoses: Impaired tissue integrity Goals: Ulcer/skin breakdown will heal within 14 weeks Date Initiated: 05/24/2019 Target Resolution Date: 08/24/2019 Goal Status: Active Interventions: Assess patient/caregiver ability to obtain necessary supplies Assess patient/caregiver ability to perform ulcer/skin care regimen upon admission and as needed Assess ulceration(s) every visit Notes: Electronic Signature(s) Signed: 05/24/2019 1:41:09 PM By:  Montey Hora Entered By: Montey Hora on 05/24/2019 12:46:13 Bonnie Kane ([id number]) -------------------------------------------------------------------------------- Pain Assessment Details Patient Name: Bonnie Kane Date of Service: 05/24/2019 11:45 AM Medical Record Number: [id number] Patient Account Number: [id number] Date of Birth/Sex: February 13, 1946 (74 y.o. F) Treating RN: Cornell Barman Primary Care Alanii Ramer: Shawnie Dapper Other Clinician: Referring Havyn Ramo: Shawnie Dapper Treating Rita Vialpando/Extender: Melburn Hake, HOYT Weeks in Treatment: 0 Active Problems Location of Pain Severity and Description of Pain Patient Has Paino Yes Site Locations Pain Location: Pain in Ulcers Rate the pain. Current Pain Level: 10 Character of Pain Describe the Pain: Aching, Burning, Shooting Pain Management and Medication Current Pain Management: Electronic Signature(s) Signed: 05/24/2019 3:19:26 PM By: Gretta Cool, BSN, RN, CWS, Kim RN, BSN Entered By: Gretta Cool, BSN, RN, CWS, Kim on 05/24/2019 11:54:24 Bonnie Kane ([id number]) -------------------------------------------------------------------------------- Patient/Caregiver Education Details Patient Name: Bonnie Kane Date of Service: 05/24/2019 11:45 AM Medical Record Number: [id number] Patient Account Number: [id number] Date of Birth/Gender: 1945/06/08 (73 y.o. F) Treating RN: Montey Hora Primary Care Physician: Shawnie Dapper Other Clinician: Referring Physician: Shawnie Dapper Treating Physician/Extender: Melburn Hake, HOYT Weeks in Treatment: 0 Education Assessment Education Provided To: Patient and Caregiver Education Topics Provided Venous: Handouts: Other: need for ongoing compression Wound/Skin Impairment: Handouts: Other: wound care as ordered Methods: Explain/Verbal Responses: State content correctly Electronic Signature(s) Signed: 05/24/2019 1:41:09 PM By: Montey Hora Entered By: Montey Hora on 05/24/2019 12:54:01 Bonnie Kane  ([id number]) -------------------------------------------------------------------------------- Wound Assessment Details Patient Name: Bonnie Kane Date of Service: 05/24/2019 11:45 AM Medical Record Number: [id number] Patient Account Number: [id number] Date of Birth/Sex: 02/22/1946 (73 y.o. F) Treating RN: Army Melia Primary Care Meyah Corle: Shawnie Dapper Other Clinician: Referring Brittiney Dicostanzo: Shawnie Dapper Treating Cecilee Rosner/Extender: STONE III, HOYT Weeks in Treatment: 0 Wound Status Wound Number: 1 Primary Trauma, Other Etiology: Wound Location: Left Lower Leg Wound Open Wounding Event: Blister Status: Date Acquired: 05/01/2019 Comorbid Type II Diabetes, Osteoarthritis, Neuropathy, Weeks Of Treatment: 0 History: Received Chemotherapy, Received Radiation Clustered Wound: No Photos Wound Measurements Length: (cm) 4 Width: (cm) 5.5 Depth: (cm) 0.1 Area: (cm) 17.279 Volume: (cm) 1.728 % Reduction in Area: 0% % Reduction in Volume: 0% Epithelialization: None Tunneling: No Undermining: No Wound Description Full Thickness Without Exposed Support Foul Classification: Structures Exudate Large Amount: Exudate Type: Serosanguineous Exudate Color: red, brown Odor After Cleansing: No Wound Bed Granulation Amount: Small (1-33%) Exposed Structure Granulation Quality: Pink Fascia Exposed: No Necrotic Amount: Large (67-100%) Fat Layer (Subcutaneous Tissue) Exposed: Yes Necrotic Quality: Adherent Slough Tendon Exposed: No Muscle Exposed: No Joint Exposed: No Bone Exposed: No Treatment Notes Geist, Jacquel ([id number]) Wound #1 (Left Lower Leg) Notes silvercel, abd, xtrasorb, kerlix and Tubi F Electronic Signature(s) Signed: 05/24/2019 12:29:22 PM By: Army Melia Entered By: Army Melia on 05/24/2019 12:29:22 Bonnie Kane ([id number]) -------------------------------------------------------------------------------- Wound Assessment Details Patient Name: Bonnie Kane Date of  Service: 05/24/2019 11:45 AM Medical Record Number: [id number] Patient Account Number: [id number] Date of Birth/Sex: 12-29-1945 (74 y.o. F) Treating RN: Army Melia Primary Care Halaina Vanduzer: Shawnie Dapper Other Clinician: Referring Leahanna Buser: Shawnie Dapper Treating Alanea Woolridge/Extender: STONE III, HOYT Weeks in Treatment: 0 Wound Status Wound Number: 2 Primary Trauma,  Other Etiology: Wound Location: Left Lower Leg - Proximal Wound Open Wounding Event: Blister Status: Date Acquired: 05/01/2019 Comorbid Type II Diabetes, Osteoarthritis, Neuropathy, Weeks Of Treatment: 0 History: Received Chemotherapy, Received Radiation Clustered Wound: No Photos Wound Measurements Length: (cm) 1.2 Width: (cm) 2 Depth: (cm) 0.1 Area: (cm) 1.885 Volume: (cm) 0.188 % Reduction in Area: 0% % Reduction in Volume: 0% Epithelialization: None Tunneling: No Undermining: No Wound Description Full Thickness Without Exposed Support Foul Classification: Structures Sloug Exudate Medium Amount: Exudate Type: Serosanguineous Exudate Color: red, brown Odor After Cleansing: No h/Fibrino Yes Wound Bed Granulation Amount: Small (1-33%) Exposed Structure Granulation Quality: Pink Fascia Exposed: No Necrotic Amount: Large (67-100%) Fat Layer (Subcutaneous Tissue) Exposed: Yes Necrotic Quality: Adherent Slough Tendon Exposed: No Muscle Exposed: No Joint Exposed: No Bone Exposed: No Treatment Notes Stallbaumer, Mava ([id number]) Wound #2 (Left, Proximal Lower Leg) Notes silvercel, abd, xtrasorb, kerlix and Tubi F Electronic Signature(s) Signed: 05/24/2019 12:29:42 PM By: Army Melia Entered By: Army Melia on 05/24/2019 12:29:41 Bonnie Kane ([id number]) -------------------------------------------------------------------------------- Wound Assessment Details Patient Name: Bonnie Kane Date of Service: 05/24/2019 11:45 AM Medical Record Number: [id number] Patient Account Number: [id number] Date of Birth/Sex:  Nov 11, 1945 (74 y.o. F) Treating RN: Montey Hora Primary Care Colbin Jovel: Shawnie Dapper Other Clinician: Referring Zaven Klemens: Shawnie Dapper Treating Takeila Thayne/Extender: STONE III, HOYT Weeks in Treatment: 0 Wound Status Wound Number: 3 Primary Venous Leg Ulcer Etiology: Wound Location: Left, Dorsal Foot Wound Open Wounding Event: Blister Status: Date Acquired: 05/01/2019 Comorbid Type II Diabetes, Osteoarthritis, Neuropathy, Weeks Of Treatment: 0 History: Received Chemotherapy, Received Radiation Clustered Wound: No Photos Wound Measurements Length: (cm) 6 Width: (cm) 12 Depth: (cm) 0.1 Area: (cm) 56.549 Volume: (cm) 5.655 % Reduction in Area: 0% % Reduction in Volume: 0% Epithelialization: None Tunneling: No Undermining: No Wound Description Full Thickness Without Exposed Support Foul Classification: Structures Sloug Exudate Medium Amount: Exudate Type: Serosanguineous Exudate Color: red, brown Odor After Cleansing: No h/Fibrino Yes Wound Bed Granulation Amount: Small (1-33%) Exposed Structure Granulation Quality: Pink Fascia Exposed: No Necrotic Amount: Large (67-100%) Fat Layer (Subcutaneous Tissue) Exposed: Yes Necrotic Quality: Eschar, Adherent Slough Tendon Exposed: No Muscle Exposed: No Joint Exposed: No Bone Exposed: No Treatment Notes Reust, Toluwanimi ([id number]) Wound #3 (Left, Dorsal Foot) Notes silvercel, abd, xtrasorb, kerlix and Tubi F Electronic Signature(s) Signed: 05/24/2019 1:41:09 PM By: Montey Hora Previous Signature: 05/24/2019 12:30:00 PM Version By: Army Melia Entered By: Montey Hora on 05/24/2019 12:51:29 Bonnie Kane ([id number]) -------------------------------------------------------------------------------- Wound Assessment Details Patient Name: Bonnie Kane Date of Service: 05/24/2019 11:45 AM Medical Record Number: [id number] Patient Account Number: [id number] Date of Birth/Sex: 1946/01/15 (73 y.o. F) Treating RN: Montey Hora Primary Care Melady Chow: Shawnie Dapper Other Clinician: Referring Jak Haggar: Shawnie Dapper Treating Saleena Tamas/Extender: STONE III, HOYT Weeks in Treatment: 0 Wound Status Wound Number: 4 Primary Venous Leg Ulcer Etiology: Wound Location: Left, Medial Foot Wound Open Wounding Event: Blister Status: Date Acquired: 05/01/2019 Comorbid Type II Diabetes, Osteoarthritis, Neuropathy, Weeks Of Treatment: 0 History: Received Chemotherapy, Received Radiation Clustered Wound: No Photos Wound Measurements Length: (cm) 4 Width: (cm) 3 Depth: (cm) 0.1 Area: (cm) 9.425 Volume: (cm) 0.942 % Reduction in Area: 0% % Reduction in Volume: 0% Epithelialization: None Tunneling: No Undermining: No Wound Description Full Thickness Without Exposed Support Foul Classification: Structures Sloug Exudate Medium Amount: Exudate Type: Serosanguineous Exudate Color: red, brown Odor After Cleansing: No h/Fibrino Yes Wound Bed Granulation Amount: Medium (34-66%) Exposed Structure Granulation Quality: Pink Fascia Exposed: No Necrotic Amount: Medium (34-66%) Fat Layer (Subcutaneous Tissue) Exposed: Yes  Necrotic Quality: Eschar, Adherent Slough Tendon Exposed: No Muscle Exposed: No Joint Exposed: No Bone Exposed: No Treatment Notes Rudzinski, Zuleyma ([id number]) Wound #4 (Left, Medial Foot) Notes silvercel, abd, xtrasorb, kerlix and Tubi F Electronic Signature(s) Signed: 05/24/2019 1:41:09 PM By: Montey Hora Previous Signature: 05/24/2019 12:30:18 PM Version By: Army Melia Entered By: Montey Hora on 05/24/2019 12:51:45 Bonnie Kane ([id number]) -------------------------------------------------------------------------------- Wound Assessment Details Patient Name: Bonnie Kane Date of Service: 05/24/2019 11:45 AM Medical Record Number: [id number] Patient Account Number: [id number] Date of Birth/Sex: 06/29/1945 (73 y.o. F) Treating RN: Army Melia Primary Care Terryn Rosenkranz: Shawnie Dapper  Other Clinician: Referring Jaelee Laughter: Shawnie Dapper Treating Moxon Messler/Extender: STONE III, HOYT Weeks in Treatment: 0 Wound Status Wound Number: 5 Primary Venous Leg Ulcer Etiology: Wound Location: Left Malleolus - Lateral Wound Open Wounding Event: Blister Status: Date Acquired: 05/01/2019 Comorbid Type II Diabetes, Osteoarthritis, Neuropathy, Weeks Of Treatment: 0 History: Received Chemotherapy, Received Radiation Clustered Wound: No Photos Wound Measurements Length: (cm) 2 Width: (cm) 2.2 Depth: (cm) 0.1 Area: (cm) 3.456 Volume: (cm) 0.346 % Reduction in Area: 0% % Reduction in Volume: 0% Epithelialization: None Tunneling: No Undermining: No Wound Description Full Thickness Without Exposed Support Foul Classification: Structures Sloug Exudate Medium Amount: Exudate Type: Serosanguineous Exudate Color: red, brown Odor After Cleansing: No h/Fibrino Yes Wound Bed Granulation Amount: None Present (0%) Exposed Structure Necrotic Amount: Large (67-100%) Fascia Exposed: No Necrotic Quality: Eschar Fat Layer (Subcutaneous Tissue) Exposed: Yes Tendon Exposed: No Muscle Exposed: No Joint Exposed: No Bone Exposed: No Treatment Notes Behan, Olinda ([id number]) Wound #5 (Left, Lateral Malleolus) Notes silvercel, abd, xtrasorb, kerlix and Tubi F Electronic Signature(s) Signed: 05/24/2019 12:30:35 PM By: Army Melia Entered By: Army Melia on 05/24/2019 12:30:35 Bonnie Kane ([id number]) -------------------------------------------------------------------------------- Vitals Details Patient Name: Bonnie Kane Date of Service: 05/24/2019 11:45 AM Medical Record Number: [id number] Patient Account Number: [id number] Date of Birth/Sex: 06/16/1945 (74 y.o. F) Treating RN: Montey Hora Primary Care Caryssa Elzey: Shawnie Dapper Other Clinician: Referring Jazzie Trampe: Shawnie Dapper Treating Zettie Gootee/Extender: STONE III, HOYT Weeks in Treatment: 0 Vital Signs Time Taken:  12:44 Temperature (F): 98.1 Height (in): 64 Pulse (bpm): 80 Source: Measured Respiratory Rate (breaths/min): 18 Weight (lbs): 250 Blood Pressure (mmHg): 131/89 Source: Measured Reference Range: 80 - 120 mg / dl Body Mass Index (BMI): 42.9 Electronic Signature(s) Signed: 05/24/2019 1:41:09 PM By: Montey Hora Entered By: Montey Hora on 05/24/2019 12:59:47

## 2019-05-29 ENCOUNTER — Telehealth: Payer: Self-pay

## 2019-05-29 NOTE — Telephone Encounter (Signed)
Patient contacted and lab appt rescheduled for next week, 2/17   Client [facility] - Client Client [facility] Crissie Sickles - MD Contact Type Call Who Is Calling Patient / Member / Family / Caregiver Caller Name Alayne Shedrick Caller Phone Number [phone] Patient Name Bonnie Kane Patient DOB 26-Jun-1945 Call Type Message Only Information Provided Reason for Call Request to [facility] Appointment Initial Comment Caller states she is calling to cancel her appointment for Thursday. Additional Comment Nurse triage was offered & declined.

## 2019-05-30 ENCOUNTER — Ambulatory Visit: Payer: Medicare Other

## 2019-05-31 ENCOUNTER — Encounter: Payer: Medicare Other | Admitting: Physician Assistant

## 2019-05-31 ENCOUNTER — Other Ambulatory Visit: Payer: Self-pay

## 2019-05-31 DIAGNOSIS — S90522A Blister (nonthermal), left ankle, initial encounter: Secondary | ICD-10-CM | POA: Diagnosis not present

## 2019-05-31 DIAGNOSIS — S90822A Blister (nonthermal), left foot, initial encounter: Secondary | ICD-10-CM | POA: Diagnosis not present

## 2019-05-31 DIAGNOSIS — E11621 Type 2 diabetes mellitus with foot ulcer: Secondary | ICD-10-CM | POA: Diagnosis not present

## 2019-05-31 DIAGNOSIS — I872 Venous insufficiency (chronic) (peripheral): Secondary | ICD-10-CM | POA: Diagnosis not present

## 2019-05-31 DIAGNOSIS — L97821 Non-pressure chronic ulcer of other part of left lower leg limited to breakdown of skin: Secondary | ICD-10-CM | POA: Diagnosis not present

## 2019-05-31 DIAGNOSIS — L97521 Non-pressure chronic ulcer of other part of left foot limited to breakdown of skin: Secondary | ICD-10-CM | POA: Diagnosis not present

## 2019-05-31 DIAGNOSIS — I890 Lymphedema, not elsewhere classified: Secondary | ICD-10-CM | POA: Diagnosis not present

## 2019-05-31 DIAGNOSIS — S80822A Blister (nonthermal), left lower leg, initial encounter: Secondary | ICD-10-CM | POA: Diagnosis not present

## 2019-05-31 DIAGNOSIS — L97512 Non-pressure chronic ulcer of other part of right foot with fat layer exposed: Secondary | ICD-10-CM | POA: Diagnosis not present

## 2019-05-31 NOTE — Progress Notes (Addendum)
Bonnie Kane ([id number]) Visit Report for 05/31/2019 Chief Complaint Document Details Patient Name: Bonnie Kane Date of Service: 05/31/2019 12:30 PM Medical Record Number: [id number] Patient Account Number: [id number] Date of Birth/Sex: [birth year] (74 y.o. F) Treating RN: Montey Hora Primary Care Provider: Shawnie Dapper Other Clinician: Referring Provider: Shawnie Dapper Treating Provider/Extender: Melburn Hake, HOYT Weeks in Treatment: 1 Information Obtained from: Patient Chief Complaint Left LE Blisters and Edema Electronic Signature(s) Signed: 05/31/2019 12:54:53 PM By: Worthy Keeler PA-C Entered By: Worthy Keeler on 05/31/2019 12:54:53 Bonnie Kane ([id number]) -------------------------------------------------------------------------------- HPI Details Patient Name: Bonnie Kane Date of Service: 05/31/2019 12:30 PM Medical Record Number: [id number] Patient Account Number: [id number] Date of Birth/Sex: 1945/07/11 (73 y.o. F) Treating RN: Montey Hora Primary Care Provider: Shawnie Dapper Other Clinician: Referring Provider: Shawnie Dapper Treating Provider/Extender: Melburn Hake, HOYT Weeks in Treatment: 1 History of Present Illness HPI Description: 05/24/19 patient presents today for initial evaluation here in our clinic as a referral from Dr. Julien Nordmann who is her family medicine practitioner. He has been taking care of this patient for several weeks now it appears. At least since it appears December 2020 although she has had some issue for quite some time. She has seen the vascular surgeon at [facility] and did have ABIs and TBI's which were performed back at that point. That was in September 2020. With that being said unfortunately she did not show signs of low ABIs and TBI's on the left which is where the blistering is taking place at this point her right seem to be in good shape as far as the results were concerned. She had a right ABI of 1.17 with a  TBI of 0.85 and a left ABI of 0.68 with a TBI of 0.43. This is at least moderate disease. The patient does have diabetes although apparently this is diet controlled according to what they tell me at this point. She does also have congestive heart failure, bilateral lower extremity edema with a history of chronic venous stasis, systolic heart murmur, hypertension, and appears to have also had a transcatheter aortic valve replacement. Was on April 2018. The patient has been on doxycycline initially by Dr. Ernestine Conrad and then subsequently this was switched just yesterday to Cipro. He does note in his note yesterday that she had improved but he still recommended that she come to the wound care center for further evaluation and treatment. He did also on top given her the Cipro to cover the Serratia give her Vicodin for helping with the pain he also performed a basic metabolic panel and a CBC yesterday as well. He does have a history of having been recommended compression stockings in the past although apparently this ripped skin off when they remove them according to the patient and her husband. He is present during the evaluation today as well. Is also do CBC which actually appear to be doing fairly well the white blood cell count was 7.0 and appears normal. The metabolic panel did show an elevation in the BUN and creatinine as well as the ratio at this point her glucose was 231 not be fasting but still that is an elevation range. Right now the patient is taking 40 mg of Lasix in the morning and 20 mg in the evening. He is also get a recheck the metabolic panel in a week. He notes that her kidney function was just a little bit down compared to her baseline. His most recent hemoglobin A1c was 7.2 on October 25, 2018. That was found in care everywhere from [place]. Patient's wound culture was actually performed just 4 days ago and again did show Serratia as the causative organism for  the infection currently and obviously doxycycline is not to work for this that is why she was switched to Cipro. 05/31/2019 upon evaluation today patient appears to be doing well at this time with regard to her wounds on the left lower extremity. Several of the blisters that she did have have also significantly improved. These have ruptured but do not appear to be spreading and a lot of the tissue though I think will come off I do not think we have to rush as far as this is concerned she is having a lot of discomfort still that really is disproportionate even to her wounds which seem to be improving. She did get the Farrow wrap although her husband states that she is not good to be able to wear the sock portion that goes underneath as it has something covering her toes she apparently is unable to wear anything that covers her toes. He states he is been married for close to 50 years and is never seen her where anything that even resembles a sock. Electronic Signature(s) Signed: 05/31/2019 2:17:00 PM By: Worthy Keeler PA-C Entered By: Worthy Keeler on 05/31/2019 14:17:00 Bonnie Kane ([id number]) -------------------------------------------------------------------------------- Physical Exam Details Patient Name: Bonnie Kane Date of Service: 05/31/2019 12:30 PM Medical Record Number: [id number] Patient Account Number: [id number] Date of Birth/Sex: 1945-08-27 (73 y.o. F) Treating RN: Montey Hora Primary Care Provider: Shawnie Dapper Other Clinician: Referring Provider: Shawnie Dapper Treating Provider/Extender: STONE III, HOYT Weeks in Treatment: 1 Constitutional Obese and well-hydrated in no acute distress. Respiratory normal breathing without difficulty. Psychiatric this patient is able to make decisions and demonstrates good insight into disease process. Alert and Oriented x 3. pleasant and cooperative. Notes Patient's wound bed currently showed signs of good granulation she did  have some slough noted I was able to gently clean some of this away but really outside of just gentle cleansing with saline and moistened gauze I would really was not able to do anything further. The patient could potentially have a benefited from some sharp debridement but that is not can be possible due to her severe pain. Electronic Signature(s) Signed: 05/31/2019 2:17:27 PM By: Worthy Keeler PA-C Entered By: Worthy Keeler on 05/31/2019 14:17:26 Bonnie Kane ([id number]) -------------------------------------------------------------------------------- Physician Orders Details Patient Name: Bonnie Kane Date of Service: 05/31/2019 12:30 PM Medical Record Number: [id number] Patient Account Number: [id number] Date of Birth/Sex: 04-Nov-1945 (73 y.o. F) Treating RN: Montey Hora Primary Care Provider: Shawnie Dapper Other Clinician: Referring Provider: Shawnie Dapper Treating Provider/Extender: Melburn Hake, HOYT Weeks in Treatment: 1 Verbal / Phone Orders: No Diagnosis Coding ICD-10 Coding Code Description I87.2 Venous insufficiency (chronic) (peripheral) I89.0 Lymphedema, not elsewhere classified I73.89 Other specified peripheral vascular diseases L97.821 Non-pressure chronic ulcer of other part of left lower leg limited to breakdown of skin L97.521 Non-pressure chronic ulcer of other part of left foot limited to breakdown of skin I50.42 Chronic combined systolic (congestive) and diastolic (congestive) heart failure E11.621 Type 2 diabetes mellitus with foot ulcer Wound Cleansing Wound #1 Left Lower Leg o Clean wound with Normal Saline. o Dial antibacterial soap, wash wounds, rinse and pat dry prior to dressing wounds o May Shower, gently pat wound dry prior to applying new dressing. Wound #2 Left,Proximal Lower Leg o Clean wound with Normal Saline. o Dial antibacterial  soap, wash wounds, rinse and pat dry prior to dressing wounds o May Shower, gently pat wound dry  prior to applying new dressing. Wound #3 Left,Dorsal Foot o Clean wound with Normal Saline. o Dial antibacterial soap, wash wounds, rinse and pat dry prior to dressing wounds o May Shower, gently pat wound dry prior to applying new dressing. Wound #4 Left,Medial Malleolus o Clean wound with Normal Saline. o Dial antibacterial soap, wash wounds, rinse and pat dry prior to dressing wounds o May Shower, gently pat wound dry prior to applying new dressing. Wound #5 Left,Lateral Malleolus o Clean wound with Normal Saline. o Dial antibacterial soap, wash wounds, rinse and pat dry prior to dressing wounds o May Shower, gently pat wound dry prior to applying new dressing. Primary Wound Dressing Wound #1 Left Lower Leg o Silver Alginate Wound #2 Left,Proximal Lower Leg o Silver Alginate Kane, Bonnie ([id number]) Wound #3 Left,Dorsal Foot o Silver Alginate Wound #4 Left,Medial Malleolus o Silver Alginate Wound #5 Left,Lateral Malleolus o Silver Alginate Secondary Dressing Wound #1 Left Lower Leg o ABD and Kerlix/Conform o XtraSorb Wound #2 Left,Proximal Lower Leg o ABD and Kerlix/Conform o XtraSorb Wound #3 Left,Dorsal Foot o ABD and Kerlix/Conform o XtraSorb Wound #4 Left,Medial Malleolus o ABD and Kerlix/Conform o XtraSorb Wound #5 Left,Lateral Malleolus o ABD and Kerlix/Conform o XtraSorb Dressing Change Frequency Wound #1 Left Lower Leg o Change dressing every other day. Wound #2 Left,Proximal Lower Leg o Change dressing every other day. Wound #3 Left,Dorsal Foot o Change dressing every other day. Wound #4 Left,Medial Malleolus o Change dressing every other day. Wound #5 Left,Lateral Malleolus o Change dressing every other day. Follow-up Appointments o Return Appointment in 1 week. Edema Control Wound #1 Left Lower Leg o Elevate legs to the level of the heart and pump ankles as often as possible o Other:  - TubiGrip F Wound #2 Left,Proximal Lower Leg o Elevate legs to the level of the heart and pump ankles as often as possible Kane, Bonnie Sidles ([id number]) o Other: - TubiGrip F Wound #3 Left,Dorsal Foot o Elevate legs to the level of the heart and pump ankles as often as possible o Other: - TubiGrip F Wound #4 Left,Medial Malleolus o Elevate legs to the level of the heart and pump ankles as often as possible o Other: - TubiGrip F Wound #5 Left,Lateral Malleolus o Elevate legs to the level of the heart and pump ankles as often as possible o Other: - TubiGrip F Electronic Signature(s) Signed: 05/31/2019 3:03:45 PM By: Montey Hora Signed: 05/31/2019 3:41:32 PM By: Worthy Keeler PA-C Entered By: Montey Hora on 05/31/2019 13:05:13 Bonnie Kane ([id number]) -------------------------------------------------------------------------------- Problem List Details Patient Name: Bonnie Kane Date of Service: 05/31/2019 12:30 PM Medical Record Number: [id number] Patient Account Number: [id number] Date of Birth/Sex: 1946/03/22 (73 y.o. F) Treating RN: Montey Hora Primary Care Provider: Shawnie Dapper Other Clinician: Referring Provider: Shawnie Dapper Treating Provider/Extender: Melburn Hake, HOYT Weeks in Treatment: 1 Active Problems ICD-10 Evaluated Encounter Code Description Active Date Today Diagnosis I87.2 Venous insufficiency (chronic) (peripheral) 05/24/2019 No Yes I89.0 Lymphedema, not elsewhere classified 05/24/2019 No Yes I73.89 Other specified peripheral vascular diseases 05/24/2019 No Yes L97.821 Non-pressure chronic ulcer of other part of left lower leg 05/24/2019 No Yes limited to breakdown of skin L97.521 Non-pressure chronic ulcer of other part of left foot limited to 05/24/2019 No Yes breakdown of skin I50.42 Chronic combined systolic (congestive) and diastolic A999333 No Yes (congestive) heart failure E11.621 Type 2 diabetes mellitus with foot ulcer  05/24/2019 No  Yes Inactive Problems Resolved Problems Electronic Signature(s) Signed: 05/31/2019 12:54:45 PM By: Worthy Keeler PA-C Entered By: Worthy Keeler on 05/31/2019 12:54:44 Bonnie Kane ([id number]) -------------------------------------------------------------------------------- Progress Note Details Patient Name: Bonnie Kane Date of Service: 05/31/2019 12:30 PM Medical Record Number: [id number] Patient Account Number: [id number] Date of Birth/Sex: 1946/01/06 (73 y.o. F) Treating RN: Montey Hora Primary Care Provider: Shawnie Dapper Other Clinician: Referring Provider: Shawnie Dapper Treating Provider/Extender: Melburn Hake, HOYT Weeks in Treatment: 1 Subjective Chief Complaint Information obtained from Patient Left LE Blisters and Edema History of Present Illness (HPI) 05/24/19 patient presents today for initial evaluation here in our clinic as a referral from Dr. Julien Nordmann who is her family medicine practitioner. He has been taking care of this patient for several weeks now it appears. At least since it appears December 2020 although she has had some issue for quite some time. She has seen the vascular surgeon at [facility] and did have ABIs and TBI's which were performed back at that point. That was in September 2020. With that being said unfortunately she did not show signs of low ABIs and TBI's on the left which is where the blistering is taking place at this point her right seem to be in good shape as far as the results were concerned. She had a right ABI of 1.17 with a TBI of 0.85 and a left ABI of 0.68 with a TBI of 0.43. This is at least moderate disease. The patient does have diabetes although apparently this is diet controlled according to what they tell me at this point. She does also have congestive heart failure, bilateral lower extremity edema with a history of chronic venous stasis, systolic heart murmur, hypertension, and appears to have also had a  transcatheter aortic valve replacement. Was on April 2018. The patient has been on doxycycline initially by Dr. Ernestine Conrad and then subsequently this was switched just yesterday to Cipro. He does note in his note yesterday that she had improved but he still recommended that she come to the wound care center for further evaluation and treatment. He did also on top given her the Cipro to cover the Serratia give her Vicodin for helping with the pain he also performed a basic metabolic panel and a CBC yesterday as well. He does have a history of having been recommended compression stockings in the past although apparently this ripped skin off when they remove them according to the patient and her husband. He is present during the evaluation today as well. Is also do CBC which actually appear to be doing fairly well the white blood cell count was 7.0 and appears normal. The metabolic panel did show an elevation in the BUN and creatinine as well as the ratio at this point her glucose was 231 not be fasting but still that is an elevation range. Right now the patient is taking 40 mg of Lasix in the morning and 20 mg in the evening. He is also get a recheck the metabolic panel in a week. He notes that her kidney function was just a little bit down compared to her baseline. His most recent hemoglobin A1c was 7.2 on October 25, 2018. That was found in care everywhere from [place]. Patient's wound culture was actually performed just 4 days ago and again did show Serratia as the causative organism for the infection currently and obviously doxycycline is not to work for this that is why she was switched  to Cipro. 05/31/2019 upon evaluation today patient appears to be doing well at this time with regard to her wounds on the left lower extremity. Several of the blisters that she did have have also significantly improved. These have ruptured but do not appear to be spreading and a lot of the tissue  though I think will come off I do not think we have to rush as far as this is concerned she is having a lot of discomfort still that really is disproportionate even to her wounds which seem to be improving. She did get the Farrow wrap although her husband states that she is not good to be able to wear the sock portion that goes underneath as it has something covering her toes she apparently is unable to wear anything that covers her toes. He states he is been married for close to 50 years and is never seen her where anything that even resembles a sock. Objective Bonnie Kane, Bonnie Kane ([id number]) Constitutional Obese and well-hydrated in no acute distress. Vitals Time Taken: 12:40 PM, Height: 64 in, Weight: 250 lbs, BMI: 42.9, Temperature: 98.9 F, Pulse: 75 bpm, Respiratory Rate: 18 breaths/min, Blood Pressure: 149/66 mmHg. Respiratory normal breathing without difficulty. Psychiatric this patient is able to make decisions and demonstrates good insight into disease process. Alert and Oriented x 3. pleasant and cooperative. General Notes: Patient's wound bed currently showed signs of good granulation she did have some slough noted I was able to gently clean some of this away but really outside of just gentle cleansing with saline and moistened gauze I would really was not able to do anything further. The patient could potentially have a benefited from some sharp debridement but that is not can be possible due to her severe pain. Integumentary (Hair, Skin) Wound #1 status is Open. Original cause of wound was Blister. The wound is located on the Left Lower Leg. The wound measures 3.4cm length x 6cm width x 0.1cm depth; 16.022cm^2 area and 1.602cm^3 volume. Wound #2 status is Open. Original cause of wound was Blister. The wound is located on the Left,Proximal Lower Leg. The wound measures 1.8cm length x 2cm width x 0.1cm depth; 2.827cm^2 area and 0.283cm^3 volume. Wound #3 status is Open. Original cause  of wound was Blister. The wound is located on the Left,Dorsal Foot. The wound measures 5.5cm length x 9cm width x 0.1cm depth; 38.877cm^2 area and 3.888cm^3 volume. Wound #4 status is Open. Original cause of wound was Blister. The wound is located on the Left,Medial Malleolus. The wound measures 3.5cm length x 2cm width x 0.1cm depth; 5.498cm^2 area and 0.55cm^3 volume. Wound #5 status is Open. Original cause of wound was Blister. The wound is located on the Left,Lateral Malleolus. The wound measures 2cm length x 1.5cm width x 0.1cm depth; 2.356cm^2 area and 0.236cm^3 volume. 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 Dial antibacterial soap, wash wounds, rinse and pat dry prior to dressing wounds May Shower, gently pat wound dry prior to applying new dressing. Wound #2 Left,Proximal Lower Leg: Clean wound with Normal Saline. Dial antibacterial soap, wash wounds, rinse  and pat dry prior to dressing wounds May Shower, gently pat wound dry prior to applying new dressing. Wound #3 Left,Dorsal Foot: Clean wound with Normal Saline. Dial antibacterial soap, wash wounds, rinse and pat dry prior to dressing wounds May Shower, gently pat wound dry prior to applying new dressing. Wound #4 Left,Medial Malleolus: Clean wound with Normal Saline. Dial antibacterial soap, wash wounds, rinse and pat dry prior to dressing wounds May Shower, gently pat wound dry prior to applying new  dressing. Wound #5 Left,Lateral Malleolus: Clean wound with Normal Saline. Dial antibacterial soap, wash wounds, rinse and pat dry prior to dressing wounds May Shower, gently pat wound dry prior to applying new dressing. Primary Wound Dressing: Wound #1 Left Lower Leg: Silver Alginate Wound #2 Left,Proximal Lower Leg: Silver Alginate Wound #3 Left,Dorsal Foot: Silver Alginate Wound #4 Left,Medial Malleolus: Silver Alginate Wound #5 Left,Lateral Malleolus: Silver Alginate Secondary Dressing: Wound #1 Left Lower Leg: ABD and Kerlix/Conform XtraSorb Wound #2 Left,Proximal Lower Leg: ABD and Kerlix/Conform XtraSorb Wound #3 Left,Dorsal Foot: ABD and Kerlix/Conform XtraSorb Wound #4 Left,Medial Malleolus: ABD and Kerlix/Conform XtraSorb Wound #5 Left,Lateral Malleolus: ABD and Kerlix/Conform XtraSorb Dressing Change Frequency: Wound #1 Left Lower Leg: Change dressing every other day. Wound #2 Left,Proximal Lower Leg: Change dressing every other day. Wound #3 Left,Dorsal Foot: Change dressing every other day. Wound #4 Left,Medial Malleolus: Kane, Bonnie ([id number]) Change dressing every other day. Wound #5 Left,Lateral Malleolus: Change dressing every other day. Follow-up Appointments: Return Appointment in 1 week. Edema Control: Wound #1 Left Lower Leg: Elevate legs to the level of the heart and pump ankles as often as possible Other: - TubiGrip F Wound #2 Left,Proximal Lower Leg: Elevate legs to the level of the heart and pump ankles as often as possible Other: - TubiGrip F Wound #3 Left,Dorsal Foot: Elevate legs to the level of the heart and pump ankles as often as possible Other: - TubiGrip F Wound #4 Left,Medial Malleolus: Elevate legs to the level of the heart and pump ankles as often as possible Other: - TubiGrip F Wound #5 Left,Lateral Malleolus: Elevate legs to the level of the heart and pump ankles as often as possible Other: - TubiGrip F 1. My  suggestion at this time is good to be that we go ahead and initiate a continuation of the current wound care measures with the silver alginate dressing I do feel like that is helping her at this point. 2. Also get a suggest that we continue with the XtraSorb over top which does seem to help control the drainage. 3. I am also going to recommend that everything be secured with rolled gauze and then Tubigrip F be applied over top of this. She seems to be tolerating this although she states even that is "very tight". We will see patient back for reevaluation in 1 week here in the clinic. If anything worsens or changes patient will contact our office for additional recommendations. Electronic Signature(s) Signed: 05/31/2019 2:18:39 PM By: Worthy Keeler PA-C Entered By: Worthy Keeler on 05/31/2019 14:18:38 Bonnie Kane ([id number]) -------------------------------------------------------------------------------- SuperBill Details Patient Name: Bonnie Kane Date of Service: 05/31/2019 Medical Record Number: [id number] Patient Account Number: [id number] Date of Birth/Sex: 11-Nov-1945 (73 y.o. F) Treating RN: Montey Hora Primary Care Provider: Shawnie Dapper Other Clinician: Referring Provider: Shawnie Dapper Treating Provider/Extender: Melburn Hake, HOYT Weeks in Treatment: 1 Diagnosis Coding ICD-10 Codes Code Description I87.2 Venous insufficiency (chronic) (peripheral) I89.0 Lymphedema, not elsewhere classified I73.89 Other specified peripheral vascular diseases L97.821 Non-pressure chronic  ulcer of other part of left lower leg limited to breakdown of skin L97.521 Non-pressure chronic ulcer of other part of left foot limited to breakdown of skin I50.42 Chronic combined systolic (congestive) and diastolic (congestive) heart failure E11.621 Type 2 diabetes mellitus with foot ulcer Facility Procedures CPT4 Code: [id number] Description: [id number] - WOUND CARE VISIT-LEV 5 EST PT Modifier: Quantity:  1 Physician Procedures CPT4 Code Description: [id number] - WC PHYS LEVEL 3 - EST PT ICD-10 Diagnosis Description I87.2 Venous insufficiency (chronic) (peripheral) I89.0 Lymphedema, not elsewhere classified I73.89 Other specified peripheral vascular diseases L97.821  Non-pressure chronic ulcer of other part of left lower leg limi Modifier: ted to breakdown Quantity: 1 of skin Electronic Signature(s) Signed: 05/31/2019 2:18:52 PM By: Worthy Keeler PA-C Entered By: Worthy Keeler on 05/31/2019 14:18:51

## 2019-05-31 NOTE — Progress Notes (Signed)
MONIKE, LINEN ([id number]) Visit Report for 05/31/2019 Arrival Information Details Patient Name: Bonnie Kane, Bonnie Kane Date of Service: 05/31/2019 12:30 PM Medical Record Number: [id number] Patient Account Number: [id number] Date of Birth/Sex: 06/20/1945 (74 y.o. F) Treating RN: Montey Hora Primary Care Neesha Langton: Shawnie Dapper Other Clinician: Referring Michelina Mexicano: Shawnie Dapper Treating Tkai Serfass/Extender: Melburn Hake, HOYT Weeks in Treatment: 1 Visit Information History Since Last Visit Added or deleted any medications: No Patient Arrived: Cane Any new allergies or adverse reactions: No Arrival Time: 12:39 Had a fall or experienced change in No Accompanied By: husband activities of daily living that may affect Transfer Assistance: None risk of falls: Patient Identification Verified: Yes Signs or symptoms of abuse/neglect since last visito No Secondary Verification Process Completed: Yes Implantable device outside of the clinic excluding No cellular tissue based products placed in the center since last visit: Has Dressing in Place as Prescribed: Yes Has Compression in Place as Prescribed: Yes Pain Present Now: Yes Electronic Signature(s) Signed: 05/31/2019 1:03:35 PM By: Lorine Bears RCP, RRT, CHT Entered By: Lorine Bears on 05/31/2019 12:40:39 Bonnie Kane ([id number]) -------------------------------------------------------------------------------- Clinic Level of Care Assessment Details Patient Name: Bonnie Kane Date of Service: 05/31/2019 12:30 PM Medical Record Number: [id number] Patient Account Number: [id number] Date of Birth/Sex: Feb 02, 1946 (73 y.o. F) Treating RN: Montey Hora Primary Care Natsuko Kelsay: Shawnie Dapper Other Clinician: Referring Hallis Meditz: Shawnie Dapper Treating Seraya Jobst/Extender: Melburn Hake, HOYT Weeks in Treatment: 1 Clinic Level of Care Assessment Items TOOL 4 Quantity Score []  - Use when only an EandM is performed on FOLLOW-UP  visit 0 ASSESSMENTS - Nursing Assessment / Reassessment X - Reassessment of Co-morbidities (includes updates in patient status) 1 10 X- 1 5 Reassessment of Adherence to Treatment Plan ASSESSMENTS - Wound and Skin Assessment / Reassessment []  - Simple Wound Assessment / Reassessment - one wound 0 X- 5 5 Complex Wound Assessment / Reassessment - multiple wounds []  - 0 Dermatologic / Skin Assessment (not related to wound area) ASSESSMENTS - Focused Assessment []  - Circumferential Edema Measurements - multi extremities 0 []  - 0 Nutritional Assessment / Counseling / Intervention X- 1 5 Lower Extremity Assessment (monofilament, tuning fork, pulses) []  - 0 Peripheral Arterial Disease Assessment (using hand held doppler) ASSESSMENTS - Ostomy and/or Continence Assessment and Care []  - Incontinence Assessment and Management 0 []  - 0 Ostomy Care Assessment and Management (repouching, etc.) PROCESS - Coordination of Care X - Simple Patient / Family Education for ongoing care 1 15 []  - 0 Complex (extensive) Patient / Family Education for ongoing care X- 1 10 Staff obtains Programmer, systems, Records, Test Results / Process Orders []  - 0 Staff telephones HHA, Nursing Homes / Clarify orders / etc []  - 0 Routine Transfer to another Facility (non-emergent condition) []  - 0 Routine Hospital Admission (non-emergent condition) []  - 0 New Admissions / Biomedical engineer / Ordering NPWT, Apligraf, etc. []  - 0 Emergency Hospital Admission (emergent condition) X- 1 10 Simple Discharge Coordination Thielen, Marilyn ([id number]) []  - 0 Complex (extensive) Discharge Coordination PROCESS - Special Needs []  - Pediatric / Minor Patient Management 0 []  - 0 Isolation Patient Management []  - 0 Hearing / Language / Visual special needs []  - 0 Assessment of Community assistance (transportation, D/C planning, etc.) []  - 0 Additional assistance / Altered mentation []  - 0 Support Surface(s) Assessment (bed,  cushion, seat, etc.) INTERVENTIONS - Wound Cleansing / Measurement []  - Simple Wound Cleansing - one wound 0 X- 5 5 Complex Wound Cleansing - multiple wounds X- 1 5 Wound Imaging (  photographs - any number of wounds) []  - 0 Wound Tracing (instead of photographs) []  - 0 Simple Wound Measurement - one wound X- 5 5 Complex Wound Measurement - multiple wounds INTERVENTIONS - Wound Dressings []  - Small Wound Dressing one or multiple wounds 0 []  - 0 Medium Wound Dressing one or multiple wounds X- 2 20 Large Wound Dressing one or multiple wounds []  - 0 Application of Medications - topical []  - 0 Application of Medications - injection INTERVENTIONS - Miscellaneous []  - External ear exam 0 []  - 0 Specimen Collection (cultures, biopsies, blood, body fluids, etc.) []  - 0 Specimen(s) / Culture(s) sent or taken to Lab for analysis []  - 0 Patient Transfer (multiple staff / Civil Service fast streamer / Similar devices) []  - 0 Simple Staple / Suture removal (25 or less) []  - 0 Complex Staple / Suture removal (26 or more) []  - 0 Hypo / Hyperglycemic Management (close monitor of Blood Glucose) []  - 0 Ankle / Brachial Index (ABI) - do not check if billed separately X- 1 5 Vital Signs Dyk, Adda ([id number]) Has the patient been seen at the hospital within the last three years: Yes Total Score: 180 Level Of Care: New/Established - Level 5 Electronic Signature(s) Signed: 05/31/2019 3:03:45 PM By: Montey Hora Entered By: Montey Hora on 05/31/2019 13:04:03 Bonnie Kane ([id number]) -------------------------------------------------------------------------------- Lower Extremity Assessment Details Patient Name: Bonnie Kane Date of Service: 05/31/2019 12:30 PM Medical Record Number: [id number] Patient Account Number: [id number] Date of Birth/Sex: Aug 13, 1945 (74 y.o. F) Treating RN: Army Melia Primary Care Sulayman Manning: Shawnie Dapper Other Clinician: Referring Giulio Bertino: Shawnie Dapper Treating  Stancil Deisher/Extender: STONE III, HOYT Weeks in Treatment: 1 Edema Assessment Assessed: [Left: No] [Right: No] Edema: [Left: Yes] [Right: Yes] Calf Left: Right: Point of Measurement: 29 cm From Medial Instep 47.5 cm cm Ankle Left: Right: Point of Measurement: 10 cm From Medial Instep 28 cm cm Vascular Assessment Pulses: Dorsalis Pedis Palpable: [Left:Yes] Electronic Signature(s) Signed: 05/31/2019 2:01:02 PM By: Army Melia Entered By: Army Melia on 05/31/2019 12:48:59 Bonnie Kane ([id number]) -------------------------------------------------------------------------------- Multi Wound Chart Details Patient Name: Bonnie Kane Date of Service: 05/31/2019 12:30 PM Medical Record Number: [id number] Patient Account Number: [id number] Date of Birth/Sex: 1945/06/04 (74 y.o. F) Treating RN: Montey Hora Primary Care Sallie Maker: Shawnie Dapper Other Clinician: Referring Griffith Santilli: Shawnie Dapper Treating Gregary Blackard/Extender: STONE III, HOYT Weeks in Treatment: 1 Vital Signs Height(in): 64 Pulse(bpm): 75 Weight(lbs): 250 Blood Pressure(mmHg): 149/66 Body Mass Index(BMI): 43 Temperature(F): 98.9 Respiratory Rate 18 (breaths/min): Photos: Wound Location: Left Lower Leg Left, Proximal Lower Leg Left, Dorsal Foot Wounding Event: Blister Blister Blister Primary Etiology: Trauma, Other Trauma, Other Venous Leg Ulcer Date Acquired: 05/01/2019 05/01/2019 05/01/2019 Weeks of Treatment: 1 1 1  Wound Status: Open Open Open Measurements L x W x D 3.4x6x0.1 1.8x2x0.1 5.5x9x0.1 (cm) Area (cm) : 16.022 2.827 38.877 Volume (cm) : 1.602 0.283 3.888 % Reduction in Area: 7.30% -50.00% 31.30% % Reduction in Volume: 7.30% -50.50% 31.20% Classification: Full Thickness Without Full Thickness Without Full Thickness Without Exposed Support Structures Exposed Support Structures Exposed Support Structures Wound Number: 4 5 N/A Photos: No Photos N/A Wound Location: Left, Medial Foot Left, Lateral  Malleolus N/A Wounding Event: Blister Blister N/A Primary Etiology: Venous Leg Ulcer Venous Leg Ulcer N/A Date Acquired: 05/01/2019 05/01/2019 N/A Weeks of Treatment: 1 1 N/A Lorah, Ansleigh ([id number]) Wound Status: Open Open N/A Measurements L x W x D 3.5x2x0.1 2x1.5x0.1 N/A (cm) Area (cm) : 5.498 2.356 N/A Volume (cm) : 0.55 0.236 N/A % Reduction in Area:  41.70% 31.80% N/A % Reduction in Volume: 41.60% 31.80% N/A Classification: Full Thickness Without Full Thickness Without N/A Exposed Support Structures Exposed Support Structures Treatment Notes Electronic Signature(s) Signed: 05/31/2019 3:03:45 PM By: Montey Hora Entered By: Montey Hora on 05/31/2019 12:57:54 Bonnie Kane ([id number]) -------------------------------------------------------------------------------- Multi-Disciplinary Care Plan Details Patient Name: Bonnie Kane Date of Service: 05/31/2019 12:30 PM Medical Record Number: [id number] Patient Account Number: [id number] Date of Birth/Sex: [birth year] (73 y.o. F) Treating RN: Montey Hora Primary Care Sabirin Baray: Shawnie Dapper Other Clinician: Referring Janah Mcculloh: Shawnie Dapper Treating Rachal Dvorsky/Extender: Melburn Hake, HOYT Weeks in Treatment: 1 Active Inactive Abuse / Safety / Falls / Self Care Management Nursing Diagnoses: Potential for falls Goals: Patient will remain injury free related to falls Date Initiated: 05/24/2019 Target Resolution Date: 08/24/2019 Goal Status: Active Interventions: Assess fall risk on admission and as needed Notes: Orientation to the Wound Care Program Nursing Diagnoses: Knowledge deficit related to the wound healing center program Goals: Patient/caregiver will verbalize understanding of the [facility] Program Date Initiated: 05/24/2019 Target Resolution Date: 08/24/2019 Goal Status: Active Interventions: Provide education on orientation to the wound center Notes: Pain, Acute or Chronic Nursing Diagnoses: Pain, acute or  chronic: actual or potential Goals: Patient/caregiver will verbalize adequate pain control between visits Date Initiated: 05/24/2019 Target Resolution Date: 08/24/2019 Goal Status: Active Interventions: Complete pain assessment as per visit requirements Dimond, Opal Sidles ([id number]) Notes: Venous Leg Ulcer Nursing Diagnoses: Potential for venous Insuffiency (use before diagnosis confirmed) Goals: Patient will maintain optimal edema control Date Initiated: 05/24/2019 Target Resolution Date: 08/24/2019 Goal Status: Active Interventions: Assess peripheral edema status every visit. Notes: Wound/Skin Impairment Nursing Diagnoses: Impaired tissue integrity Goals: Ulcer/skin breakdown will heal within 14 weeks Date Initiated: 05/24/2019 Target Resolution Date: 08/24/2019 Goal Status: Active Interventions: Assess patient/caregiver ability to obtain necessary supplies Assess patient/caregiver ability to perform ulcer/skin care regimen upon admission and as needed Assess ulceration(s) every visit Notes: Electronic Signature(s) Signed: 05/31/2019 3:03:45 PM By: Montey Hora Entered By: Montey Hora on 05/31/2019 12:57:24 Bonnie Kane ([id number]) -------------------------------------------------------------------------------- Pain Assessment Details Patient Name: Bonnie Kane Date of Service: 05/31/2019 12:30 PM Medical Record Number: [id number] Patient Account Number: [id number] Date of Birth/Sex: 1946-02-17 (74 y.o. F) Treating RN: Montey Hora Primary Care Elaysha Bevard: Shawnie Dapper Other Clinician: Referring Aariah Godette: Shawnie Dapper Treating Travonte Byard/Extender: STONE III, HOYT Weeks in Treatment: 1 Active Problems Location of Pain Severity and Description of Pain Patient Has Paino Yes Site Locations Rate the pain. Current Pain Level: 6 Pain Management and Medication Current Pain Management: Electronic Signature(s) Signed: 05/31/2019 1:03:35 PM By: Lorine Bears RCP, RRT,  CHT Signed: 05/31/2019 3:03:45 PM By: Montey Hora Entered By: Lorine Bears on 05/31/2019 12:40:51 Bonnie Kane ([id number]) -------------------------------------------------------------------------------- Patient/Caregiver Education Details Patient Name: Bonnie Kane Date of Service: 05/31/2019 12:30 PM Medical Record Number: [id number] Patient Account Number: [id number] Date of Birth/Gender: 1946/02/21 (73 y.o. F) Treating RN: Montey Hora Primary Care Physician: Shawnie Dapper Other Clinician: Referring Physician: Shawnie Dapper Treating Physician/Extender: Sharalyn Ink in Treatment: 1 Education Assessment Education Provided To: Patient and Caregiver Education Topics Provided Wound/Skin Impairment: Handouts: Other: wound carea s ordered Methods: Demonstration, Explain/Verbal Responses: State content correctly Electronic Signature(s) Signed: 05/31/2019 3:03:45 PM By: Montey Hora Entered By: Montey Hora on 05/31/2019 13:04:44 Bonnie Kane ([id number]) -------------------------------------------------------------------------------- Wound Assessment Details Patient Name: Bonnie Kane Date of Service: 05/31/2019 12:30 PM Medical Record Number: [id number] Patient Account Number: [id number] Date of Birth/Sex: 12-31-1945 (73 y.o. F) Treating RN: Army Melia Primary Care Saranya Harlin: Shawnie Dapper Other Clinician: Referring Rhone Ozaki:  MCGOWEN, PHILIP Treating Chanita Boden/Extender: STONE III, HOYT Weeks in Treatment: 1 Wound Status Wound Number: 1 Primary Etiology: Trauma, Other Wound Location: Left Lower Leg Wound Status: Open Wounding Event: Blister Date Acquired: 05/01/2019 Weeks Of Treatment: 1 Clustered Wound: No Photos Photo Uploaded By: Army Melia on 05/31/2019 12:53:18 Wound Measurements Length: (cm) 3.4 Width: (cm) 6 Depth: (cm) 0.1 Area: (cm) 16.022 Volume: (cm) 1.602 % Reduction in Area: 7.3% % Reduction in Volume: 7.3% Wound  Description Full Thickness Without Exposed Support Classification: Structures Treatment Notes Wound #1 (Left Lower Leg) Notes silvercel, abd, xtrasorb, kerlix and Tubi F Electronic Signature(s) Signed: 05/31/2019 2:01:02 PM By: Army Melia Entered By: Army Melia on 05/31/2019 12:48:11 Bonnie Kane ([id number]) -------------------------------------------------------------------------------- Wound Assessment Details Patient Name: Bonnie Kane Date of Service: 05/31/2019 12:30 PM Medical Record Number: [id number] Patient Account Number: [id number] Date of Birth/Sex: [birth year] (74 y.o. F) Treating RN: Army Melia Primary Care Bentleigh Stankus: Shawnie Dapper Other Clinician: Referring Sharri Loya: Shawnie Dapper Treating Orvetta Danielski/Extender: STONE III, HOYT Weeks in Treatment: 1 Wound Status Wound Number: 2 Primary Etiology: Trauma, Other Wound Location: Left, Proximal Lower Leg Wound Status: Open Wounding Event: Blister Date Acquired: 05/01/2019 Weeks Of Treatment: 1 Clustered Wound: No Photos Photo Uploaded By: Army Melia on 05/31/2019 12:53:35 Wound Measurements Length: (cm) 1.8 Width: (cm) 2 Depth: (cm) 0.1 Area: (cm) 2.827 Volume: (cm) 0.283 % Reduction in Area: -50% % Reduction in Volume: -50.5% Wound Description Full Thickness Without Exposed Support Classification: Structures Treatment Notes Wound #2 (Left, Proximal Lower Leg) Notes silvercel, abd, xtrasorb, kerlix and Tubi F Electronic Signature(s) Signed: 05/31/2019 2:01:02 PM By: Army Melia Entered By: Army Melia on 05/31/2019 12:48:11 Bonnie Kane ([id number]) -------------------------------------------------------------------------------- Wound Assessment Details Patient Name: Bonnie Kane Date of Service: 05/31/2019 12:30 PM Medical Record Number: [id number] Patient Account Number: [id number] Date of Birth/Sex: [birth year] (74 y.o. F) Treating RN: Army Melia Primary Care Kamri Gotsch: Shawnie Dapper Other  Clinician: Referring Deyana Wnuk: Shawnie Dapper Treating Haygen Zebrowski/Extender: STONE III, HOYT Weeks in Treatment: 1 Wound Status Wound Number: 3 Primary Etiology: Venous Leg Ulcer Wound Location: Left, Dorsal Foot Wound Status: Open Wounding Event: Blister Date Acquired: 05/01/2019 Weeks Of Treatment: 1 Clustered Wound: No Photos Photo Uploaded By: Army Melia on 05/31/2019 12:53:52 Wound Measurements Length: (cm) 5.5 Width: (cm) 9 Depth: (cm) 0.1 Area: (cm) 38.877 Volume: (cm) 3.888 % Reduction in Area: 31.3% % Reduction in Volume: 31.2% Wound Description Full Thickness Without Exposed Support Classification: Structures Treatment Notes Wound #3 (Left, Dorsal Foot) Notes silvercel, abd, xtrasorb, kerlix and Tubi F Electronic Signature(s) Signed: 05/31/2019 2:01:02 PM By: Army Melia Entered By: Army Melia on 05/31/2019 12:48:11 Bonnie Kane ([id number]) -------------------------------------------------------------------------------- Wound Assessment Details Patient Name: Bonnie Kane Date of Service: 05/31/2019 12:30 PM Medical Record Number: [id number] Patient Account Number: [id number] Date of Birth/Sex: 05-06-1945 (74 y.o. F) Treating RN: Army Melia Primary Care Shandie Bertz: Shawnie Dapper Other Clinician: Referring Salvador Coupe: Shawnie Dapper Treating Micaella Gitto/Extender: STONE III, HOYT Weeks in Treatment: 1 Wound Status Wound Number: 4 Primary Etiology: Venous Leg Ulcer Wound Location: Left, Medial Foot Wound Status: Open Wounding Event: Blister Date Acquired: 05/01/2019 Weeks Of Treatment: 1 Clustered Wound: No Photos Photo Uploaded By: Army Melia on 05/31/2019 12:54:11 Wound Measurements Length: (cm) 3.5 Width: (cm) 2 Depth: (cm) 0.1 Area: (cm) 5.498 Volume: (cm) 0.55 % Reduction in Area: 41.7% % Reduction in Volume: 41.6% Wound Description Full Thickness Without Exposed Support Classification: Structures Electronic Signature(s) Signed:  05/31/2019 2:01:02 PM By: Army Melia Entered By: Army Melia on 05/31/2019 12:51:50 Bonnie Kane ([id number]) -------------------------------------------------------------------------------- Wound  Assessment Details Patient Name: KIRALEE, SUROWIEC Date of Service: 05/31/2019 12:30 PM Medical Record Number: [id number] Patient Account Number: [id number] Date of Birth/Sex: March 11, 1946 (74 y.o. F) Treating RN: Army Melia Primary Care Marlyce Mcdougald: Shawnie Dapper Other Clinician: Referring Sayed Apostol: Shawnie Dapper Treating Normal Recinos/Extender: STONE III, HOYT Weeks in Treatment: 1 Wound Status Wound Number: 5 Primary Etiology: Venous Leg Ulcer Wound Location: Left, Lateral Malleolus Wound Status: Open Wounding Event: Blister Date Acquired: 05/01/2019 Weeks Of Treatment: 1 Clustered Wound: No Photos Photo Uploaded By: Gretta Cool, BSN, RN, CWS, Kim on 05/31/2019 15:01:24 Wound Measurements Length: (cm) 2 Width: (cm) 1.5 Depth: (cm) 0.1 Area: (cm) 2.356 Volume: (cm) 0.236 % Reduction in Area: 31.8% % Reduction in Volume: 31.8% Wound Description Full Thickness Without Exposed Support Classification: Structures Treatment Notes Wound #5 (Left, Lateral Malleolus) Notes silvercel, abd, xtrasorb, kerlix and Tubi F Electronic Signature(s) Signed: 05/31/2019 2:01:02 PM By: Army Melia Entered By: Army Melia on 05/31/2019 12:51:50 Bonnie Kane ([id number]) -------------------------------------------------------------------------------- Vitals Details Patient Name: Bonnie Kane Date of Service: 05/31/2019 12:30 PM Medical Record Number: [id number] Patient Account Number: [id number] Date of Birth/Sex: [birth year] (73 y.o. F) Treating RN: Montey Hora Primary Care Farrin Shadle: Shawnie Dapper Other Clinician: Referring Jery Hollern: Shawnie Dapper Treating Quay Simkin/Extender: STONE III, HOYT Weeks in Treatment: 1 Vital Signs Time Taken: 12:40 Temperature (F): 98.9 Height (in): 64 Pulse (bpm):  75 Weight (lbs): 250 Respiratory Rate (breaths/min): 18 Body Mass Index (BMI): 42.9 Blood Pressure (mmHg): 149/66 Reference Range: 80 - 120 mg / dl Electronic Signature(s) Signed: 05/31/2019 1:03:35 PM By: Lorine Bears RCP, RRT, CHT Entered By: Lorine Bears on 05/31/2019 12:42:27

## 2019-06-05 ENCOUNTER — Ambulatory Visit (INDEPENDENT_AMBULATORY_CARE_PROVIDER_SITE_OTHER): Payer: Medicare Other | Admitting: Family Medicine

## 2019-06-05 ENCOUNTER — Telehealth (HOSPITAL_COMMUNITY): Payer: Self-pay

## 2019-06-05 ENCOUNTER — Other Ambulatory Visit: Payer: Self-pay

## 2019-06-05 DIAGNOSIS — R7989 Other specified abnormal findings of blood chemistry: Secondary | ICD-10-CM | POA: Diagnosis not present

## 2019-06-05 LAB — BASIC METABOLIC PANEL
BUN: 27 mg/dL — ABNORMAL HIGH (ref 6–23)
CO2: 30 mEq/L (ref 19–32)
Calcium: 9 mg/dL (ref 8.4–10.5)
Chloride: 105 mEq/L (ref 96–112)
Creatinine, Ser: 1.12 mg/dL (ref 0.40–1.20)
GFR: 47.52 mL/min — ABNORMAL LOW (ref >60.00)
Glucose, Bld: 141 mg/dL — ABNORMAL HIGH (ref 70–99)
Potassium: 4.6 mEq/L (ref 3.5–5.1)
Sodium: 143 mEq/L (ref 135–145)

## 2019-06-05 NOTE — Telephone Encounter (Signed)

## 2019-06-06 ENCOUNTER — Ambulatory Visit: Payer: Medicare Other | Admitting: Physician Assistant

## 2019-06-06 ENCOUNTER — Encounter (HOSPITAL_COMMUNITY): Payer: Medicare Other

## 2019-06-07 ENCOUNTER — Encounter: Payer: Self-pay | Admitting: Family Medicine

## 2019-06-07 ENCOUNTER — Encounter: Payer: Medicare Other | Admitting: Vascular Surgery

## 2019-06-07 ENCOUNTER — Ambulatory Visit: Payer: Medicare Other | Admitting: Physician Assistant

## 2019-06-10 ENCOUNTER — Encounter: Payer: Medicare Other | Admitting: Physician Assistant

## 2019-06-10 ENCOUNTER — Other Ambulatory Visit: Payer: Self-pay

## 2019-06-10 ENCOUNTER — Telehealth: Payer: Self-pay

## 2019-06-10 DIAGNOSIS — L97512 Non-pressure chronic ulcer of other part of right foot with fat layer exposed: Secondary | ICD-10-CM | POA: Diagnosis not present

## 2019-06-10 DIAGNOSIS — E11621 Type 2 diabetes mellitus with foot ulcer: Secondary | ICD-10-CM | POA: Diagnosis not present

## 2019-06-10 DIAGNOSIS — I890 Lymphedema, not elsewhere classified: Secondary | ICD-10-CM | POA: Diagnosis not present

## 2019-06-10 DIAGNOSIS — L97521 Non-pressure chronic ulcer of other part of left foot limited to breakdown of skin: Secondary | ICD-10-CM | POA: Diagnosis not present

## 2019-06-10 DIAGNOSIS — I872 Venous insufficiency (chronic) (peripheral): Secondary | ICD-10-CM | POA: Diagnosis not present

## 2019-06-10 DIAGNOSIS — L97821 Non-pressure chronic ulcer of other part of left lower leg limited to breakdown of skin: Secondary | ICD-10-CM | POA: Diagnosis not present

## 2019-06-10 MED ORDER — DULOXETINE HCL 30 MG PO CPEP: 30.0000 mg | ORAL_CAPSULE | Freq: Every day | ORAL | 0 refills | Status: DC

## 2019-06-10 MED ORDER — HYDROCODONE-ACETAMINOPHEN 5-325 MG PO TABS: 1.0000 | ORAL_TABLET | Freq: Four times a day (QID) | ORAL | 0 refills | Status: DC | PRN

## 2019-06-10 NOTE — Telephone Encounter (Signed)
Patient was seen a t [place] today. They are to follow up with Dr. Anitra Lauth about today's visit but told patient to go ahead and request prescription for   Cymbalta (she would like a trial period maybe 30 day supply in case she cant take it like gabapentin)   [id number] - [place], [place] - [address] AT [place]

## 2019-06-10 NOTE — Telephone Encounter (Signed)
Patient refill request  HYDROcodone-acetaminophen (NORCO/VICODIN) 5-325 MG tablet [id number]   [facility] - [address] / [employer]

## 2019-06-10 NOTE — Progress Notes (Addendum)
Bonnie Kane, Bonnie Kane ([id number]) Visit Report for 06/10/2019 Arrival Information Details Patient Name: Bonnie Kane Date of Service: 06/10/2019 2:30 PM Medical Record Number: [id number] Patient Account Number: [id number] Date of Birth/Sex: 04-24-1945 (74 y.o. F) Treating RN: Cornell Barman Primary Care Shakaria Raphael: Shawnie Dapper Other Clinician: Referring Yareni Creps: Shawnie Dapper Treating Adhira Jamil/Extender: Melburn Hake, HOYT Weeks in Treatment: 2 Visit Information History Since Last Visit Added or deleted any medications: No Patient Arrived: Cane Any new allergies or adverse reactions: No Arrival Time: 14:35 Had a fall or experienced change in No Accompanied By: husband activities of daily living that may affect Transfer Assistance: None risk of falls: Patient Identification Verified: Yes Signs or symptoms of abuse/neglect since last visito No Secondary Verification Process Completed: Yes Hospitalized since last visit: No Implantable device outside of the clinic excluding No cellular tissue based products placed in the center since last visit: Has Dressing in Place as Prescribed: Yes Has Compression in Place as Prescribed: Yes Pain Present Now: Yes Electronic Signature(s) Signed: 06/10/2019 4:34:46 PM By: Lorine Bears RCP, RRT, CHT Entered By: Lorine Bears on 06/10/2019 14:38:21 Bonnie Kane ([id number]) -------------------------------------------------------------------------------- Encounter Discharge Information Details Patient Name: Bonnie Kane Date of Service: 06/10/2019 2:30 PM Medical Record Number: [id number] Patient Account Number: [id number] Date of Birth/Sex: 1946-01-24 (74 y.o. F) Treating RN: Army Melia Primary Care Mahitha Hickling: Shawnie Dapper Other Clinician: Referring Jos Cygan: Shawnie Dapper Treating Marchetta Navratil/Extender: Melburn Hake, HOYT Weeks in Treatment: 2 Encounter Discharge Information Items Post Procedure Vitals Discharge Condition:  Stable Temperature (F): 98.3 Ambulatory Status: Ambulatory Pulse (bpm): 86 Discharge Destination: Home Respiratory Rate (breaths/min): 16 Transportation: Private Auto Blood Pressure (mmHg): 166/57 Accompanied By: husband Schedule Follow-up Appointment: Yes Clinical Summary of Care: Electronic Signature(s) Signed: 06/10/2019 4:40:14 PM By: Army Melia Entered By: Army Melia on 06/10/2019 15:39:43 Bonnie Kane ([id number]) -------------------------------------------------------------------------------- Lower Extremity Assessment Details Patient Name: Bonnie Kane Date of Service: 06/10/2019 2:30 PM Medical Record Number: [id number] Patient Account Number: [id number] Date of Birth/Sex: 10-Nov-1945 (74 y.o. F) Treating RN: Cornell Barman Primary Care Tiondra Fang: Shawnie Dapper Other Clinician: Referring Kamber Vignola: Shawnie Dapper Treating Raychel Dowler/Extender: Melburn Hake, HOYT Weeks in Treatment: 2 Edema Assessment Assessed: [Left: Yes] [Right: No] Edema: [Left: Yes] [Right: Yes] Calf Left: Right: Point of Measurement: 29 cm From Medial Instep 49 cm 52 cm Ankle Left: Right: Point of Measurement: 10 cm From Medial Instep 27.5 cm 27 cm Vascular Assessment Pulses: Dorsalis Pedis Palpable: [Left:Yes] [Right:Yes] Electronic Signature(s) Signed: 06/10/2019 3:22:42 PM By: Gretta Cool, BSN, RN, CWS, Kim RN, BSN Entered By: Gretta Cool, BSN, RN, CWS, Kim on 06/10/2019 15:22:42 Bonnie Kane ([id number]) -------------------------------------------------------------------------------- Multi Wound Chart Details Patient Name: Bonnie Kane Date of Service: 06/10/2019 2:30 PM Medical Record Number: [id number] Patient Account Number: [id number] Date of Birth/Sex: 04/08/1946 (74 y.o. F) Treating RN: Army Melia Primary Care Chun Sellen: Shawnie Dapper Other Clinician: Referring Jonavin Seder: Shawnie Dapper Treating Delorese Sellin/Extender: STONE III, HOYT Weeks in Treatment: 2 Vital Signs Height(in): 64 Pulse(bpm):  80 Weight(lbs): 250 Blood Pressure(mmHg): 166/57 Body Mass Index(BMI): 43 Temperature(F): 98.3 Respiratory Rate(breaths/min): 18 Photos: Wound Location: Left Lower Leg Left Lower Leg - Proximal Left Foot - Dorsal Wounding Event: Blister Blister Blister Primary Etiology: Trauma, Other Trauma, Other Venous Leg Ulcer Comorbid History: Type II Diabetes, Osteoarthritis, Type II Diabetes, Osteoarthritis, Type II Diabetes, Osteoarthritis, Neuropathy, Received Neuropathy, Received Neuropathy, Received Chemotherapy, Received Radiation Chemotherapy, Received Radiation Chemotherapy, Received Radiation Date Acquired: 05/01/2019 05/01/2019 05/01/2019 Weeks of Treatment: 2 2 2  Wound Status: Open Open Open Measurements L x W x D (cm) 2.2x3.5x0.1 1.5x2x0.1 0.7x0.8x0.1  Area (cm) : 6.048 2.356 0.44 Volume (cm) : 0.605 0.236 0.044 % Reduction in Area: 65.00% -25.00% 99.20% % Reduction in Volume: 65.00% -25.50% 99.20% Classification: Full Thickness Without Exposed Full Thickness Without Exposed Full Thickness Without Exposed Support Structures Support Structures Support Structures Exudate Amount: Medium Medium Medium Exudate Type: Serous Serous Serous Exudate Color: amber amber amber Wound Margin: Flat and Intact Flat and Intact Flat and Intact Granulation Amount: Medium (34-66%) Medium (34-66%) None Present (0%) Granulation Quality: N/A Pink N/A Necrotic Amount: Medium (34-66%) Medium (34-66%) Large (67-100%) Exposed Structures: Fat Layer (Subcutaneous Tissue) Fat Layer (Subcutaneous Tissue) Fat Layer (Subcutaneous Tissue) Exposed: Yes Exposed: Yes Exposed: Yes Fascia: No Fascia: No Fascia: No Tendon: No Tendon: No Tendon: No Muscle: No Muscle: No Muscle: No Joint: No Joint: No Joint: No Bone: No Bone: No Bone: No Epithelialization: None None None Wound Number: 4 5 6  Photos: Bonnie Kane, Bonnie Kane ([id number]) Wound Location: Left Malleolus - Medial Left Malleolus - Lateral Right Foot -  Dorsal Wounding Event: Blister Blister Gradually Appeared Primary Etiology: Venous Leg Ulcer Venous Leg Ulcer Diabetic Wound/Ulcer of the Lower Extremity Comorbid History: Type II Diabetes, Osteoarthritis, Type II Diabetes, Osteoarthritis, Type II Diabetes, Osteoarthritis, Neuropathy, Received Neuropathy, Received Neuropathy, Received Chemotherapy, Received Radiation Chemotherapy, Received Radiation Chemotherapy, Received Radiation Date Acquired: 05/01/2019 05/01/2019 06/03/2019 Weeks of Treatment: 2 2 0 Wound Status: Healed - Epithelialized Healed - Epithelialized Open Measurements L x W x D (cm) 0x0x0 0x0x0 0.4x0.5x0.1 Area (cm) : 0 0 0.157 Volume (cm) : 0 0 0.016 % Reduction in Area: 100.00% 100.00% N/A % Reduction in Volume: 100.00% 100.00% N/A Classification: Full Thickness Without Exposed Full Thickness Without Exposed Grade 1 Support Structures Support Structures Exudate Amount: N/A N/A Medium Exudate Type: N/A N/A N/A Exudate Color: N/A N/A N/A Wound Margin: N/A N/A Flat and Intact Granulation Amount: N/A N/A None Present (0%) Granulation Quality: N/A N/A N/A Necrotic Amount: N/A N/A Large (67-100%) Exposed Structures: N/A N/A Fat Layer (Subcutaneous Tissue) Exposed: Yes Fascia: No Tendon: No Muscle: No Joint: No Bone: No Epithelialization: Large (67-100%) Large (67-100%) None Treatment Notes Electronic Signature(s) Signed: 06/10/2019 4:40:14 PM By: Army Melia Entered By: Army Melia on 06/10/2019 15:29:35 Bonnie Kane ([id number]) -------------------------------------------------------------------------------- [place] Details Patient Name: Bonnie Kane Date of Service: 06/10/2019 2:30 PM Medical Record Number: [id number] Patient Account Number: [id number] Date of Birth/Sex: 05-18-1945 (74 y.o. F) Treating RN: Army Melia Primary Care Rollie Hynek: Shawnie Dapper Other Clinician: Referring Jai Bear: Shawnie Dapper Treating Mkenzie Dotts/Extender: Melburn Hake, HOYT Weeks in Treatment: 2 Active Inactive Abuse / Safety / Falls / Self Care Management Nursing Diagnoses: Potential for falls Goals: Patient will remain injury free related to falls Date Initiated: 05/24/2019 Target Resolution Date: 08/24/2019 Goal Status: Active Interventions: Assess fall risk on admission and as needed Notes: Orientation to the Wound Care Program Nursing Diagnoses: Knowledge deficit related to the wound healing center program Goals: Patient/caregiver will verbalize understanding of the [place] Program Date Initiated: 05/24/2019 Target Resolution Date: 08/24/2019 Goal Status: Active Interventions: Provide education on orientation to the wound center Notes: Pain, Acute or Chronic Nursing Diagnoses: Pain, acute or chronic: actual or potential Goals: Patient/caregiver will verbalize adequate pain control between visits Date Initiated: 05/24/2019 Target Resolution Date: 08/24/2019 Goal Status: Active Interventions: Complete pain assessment as per visit requirements Notes: Venous Leg Ulcer Nursing Diagnoses: Potential for venous Insuffiency (use before diagnosis confirmed) Bonnie Kane, Bonnie Kane ([id number]) Goals: Patient will maintain optimal edema control Date Initiated: 05/24/2019 Target Resolution Date: 08/24/2019 Goal Status: Active  Interventions: Assess peripheral edema status every visit. Notes: Wound/Skin Impairment Nursing Diagnoses: Impaired tissue integrity Goals: Ulcer/skin breakdown will heal within 14 weeks Date Initiated: 05/24/2019 Target Resolution Date: 08/24/2019 Goal Status: Active Interventions: Assess patient/caregiver ability to obtain necessary supplies Assess patient/caregiver ability to perform ulcer/skin care regimen upon admission and as needed Assess ulceration(s) every visit Notes: Electronic Signature(s) Signed: 06/10/2019 4:40:14 PM By: Army Melia Entered By: Army Melia on 06/10/2019 15:29:11 Bonnie Kane  ([id number]) -------------------------------------------------------------------------------- Pain Assessment Details Patient Name: Bonnie Kane Date of Service: 06/10/2019 2:30 PM Medical Record Number: [id number] Patient Account Number: [id number] Date of Birth/Sex: 1945/12/24 (74 y.o. F) Treating RN: Cornell Barman Primary Care Sagar Tengan: Shawnie Dapper Other Clinician: Referring Aundreya Souffrant: Shawnie Dapper Treating Shaeley Segall/Extender: Melburn Hake, HOYT Weeks in Treatment: 2 Active Problems Location of Pain Severity and Description of Pain Patient Has Paino No Site Locations Rate the pain. Current Pain Level: 4 Pain Management and Medication Current Pain Management: Electronic Signature(s) Signed: 06/21/2019 5:40:44 PM By: Gretta Cool, BSN, RN, CWS, Kim RN, BSN Entered By: Gretta Cool, BSN, RN, CWS, Kim on 06/10/2019 15:00:09 Bonnie Kane ([id number]) -------------------------------------------------------------------------------- Patient/Caregiver Education Details Patient Name: Bonnie Kane Date of Service: 06/10/2019 2:30 PM Medical Record Number: [id number] Patient Account Number: [id number] Date of Birth/Gender: Jan 10, 1946 (74 y.o. F) Treating RN: Army Melia Primary Care Physician: Shawnie Dapper Other Clinician: Referring Physician: Shawnie Dapper Treating Physician/Extender: Sharalyn Ink in Treatment: 2 Education Assessment Education Provided To: Patient Education Topics Provided Wound/Skin Impairment: Handouts: Caring for Your Ulcer Methods: Demonstration, Explain/Verbal Responses: State content correctly Electronic Signature(s) Signed: 06/10/2019 4:40:14 PM By: Army Melia Entered By: Army Melia on 06/10/2019 15:38:38 Bonnie Kane ([id number]) -------------------------------------------------------------------------------- Wound Assessment Details Patient Name: Bonnie Kane Date of Service: 06/10/2019 2:30 PM Medical Record Number: [id number] Patient Account Number:  [id number] Date of Birth/Sex: 1946-03-09 (74 y.o. F) Treating RN: Cornell Barman Primary Care Brysan Mcevoy: Shawnie Dapper Other Clinician: Referring Sylvester Salonga: Shawnie Dapper Treating Keymiah Lyles/Extender: Melburn Hake, HOYT Weeks in Treatment: 2 Wound Status Wound Number: 1 Primary Trauma, Other Etiology: Wound Location: Left Lower Leg Wound Open Wounding Event: Blister Status: Date Acquired: 05/01/2019 Comorbid Type II Diabetes, Osteoarthritis, Neuropathy, Received Weeks Of Treatment: 2 History: Chemotherapy, Received Radiation Clustered Wound: No Photos Wound Measurements Length: (cm) 2.2 Width: (cm) 3.5 Depth: (cm) 0.1 Area: (cm) 6.048 Volume: (cm) 0.605 % Reduction in Area: 65% % Reduction in Volume: 65% Epithelialization: None Tunneling: No Wound Description Classification: Full Thickness Without Exposed Support Structu Wound Margin: Flat and Intact Exudate Amount: Medium Exudate Type: Serous Exudate Color: amber res Foul Odor After Cleansing: No Slough/Fibrino Yes Wound Bed Granulation Amount: Medium (34-66%) Exposed Structure Necrotic Amount: Medium (34-66%) Fascia Exposed: No Necrotic Quality: Adherent Slough Fat Layer (Subcutaneous Tissue) Exposed: Yes Tendon Exposed: No Muscle Exposed: No Joint Exposed: No Bone Exposed: No Electronic Signature(s) Signed: 06/21/2019 5:40:44 PM By: Gretta Cool, BSN, RN, CWS, Kim RN, BSN Entered By: Gretta Cool, BSN, RN, CWS, Kim on 06/10/2019 15:09:09 Bonnie Kane ([id number]) -------------------------------------------------------------------------------- Wound Assessment Details Patient Name: Bonnie Kane Date of Service: 06/10/2019 2:30 PM Medical Record Number: [id number] Patient Account Number: [id number] Date of Birth/Sex: [birth year] (74 y.o. F) Treating RN: Cornell Barman Primary Care Seeley Southgate: Shawnie Dapper Other Clinician: Referring Calle Schader: Shawnie Dapper Treating Isabela Nardelli/Extender: STONE III, HOYT Weeks in Treatment: 2 Wound  Status Wound Number: 2 Primary Trauma, Other Etiology: Wound Location: Left Lower Leg - Proximal Wound Open Wounding Event: Blister Status: Date Acquired: 05/01/2019 Comorbid Type II Diabetes, Osteoarthritis, Neuropathy, Received Weeks Of Treatment: 2 History: Chemotherapy, Received Radiation  Clustered Wound: No Photos Wound Measurements Length: (cm) 1.5 Width: (cm) 2 Depth: (cm) 0.1 Area: (cm) 2.356 Volume: (cm) 0.236 % Reduction in Area: -25% % Reduction in Volume: -25.5% Epithelialization: None Tunneling: No Undermining: No Wound Description Classification: Full Thickness Without Exposed Support Structu Wound Margin: Flat and Intact Exudate Amount: Medium Exudate Type: Serous Exudate Color: amber res Foul Odor After Cleansing: No Slough/Fibrino Yes Wound Bed Granulation Amount: Medium (34-66%) Exposed Structure Granulation Quality: Pink Fascia Exposed: No Necrotic Amount: Medium (34-66%) Fat Layer (Subcutaneous Tissue) Exposed: Yes Necrotic Quality: Adherent Slough Tendon Exposed: No Muscle Exposed: No Joint Exposed: No Bone Exposed: No Electronic Signature(s) Signed: 06/21/2019 5:40:44 PM By: Gretta Cool, BSN, RN, CWS, Kim RN, BSN Entered By: Gretta Cool, BSN, RN, CWS, Kim on 06/10/2019 15:10:49 Bonnie Kane ([id number]) -------------------------------------------------------------------------------- Wound Assessment Details Patient Name: Bonnie Kane Date of Service: 06/10/2019 2:30 PM Medical Record Number: [id number] Patient Account Number: [id number] Date of Birth/Sex: [birth year] (74 y.o. F) Treating RN: Cornell Barman Primary Care Avan Gullett: Shawnie Dapper Other Clinician: Referring Oreste Majeed: Shawnie Dapper Treating Tawni Melkonian/Extender: Melburn Hake, HOYT Weeks in Treatment: 2 Wound Status Wound Number: 3 Primary Venous Leg Ulcer Etiology: Wound Location: Left Foot - Dorsal Wound Open Wounding Event: Blister Status: Date Acquired: 05/01/2019 Comorbid Type II Diabetes,  Osteoarthritis, Neuropathy, Received Weeks Of Treatment: 2 History: Chemotherapy, Received Radiation Clustered Wound: No Photos Wound Measurements Length: (cm) 0.7 Width: (cm) 0.8 Depth: (cm) 0.1 Area: (cm) 0.44 Volume: (cm) 0.044 % Reduction in Area: 99.2% % Reduction in Volume: 99.2% Epithelialization: None Tunneling: No Undermining: No Wound Description Classification: Full Thickness Without Exposed Support Structu Wound Margin: Flat and Intact Exudate Amount: Medium Exudate Type: Serous Exudate Color: amber res Foul Odor After Cleansing: No Slough/Fibrino Yes Wound Bed Granulation Amount: None Present (0%) Exposed Structure Necrotic Amount: Large (67-100%) Fascia Exposed: No Necrotic Quality: Adherent Slough Fat Layer (Subcutaneous Tissue) Exposed: Yes Tendon Exposed: No Muscle Exposed: No Joint Exposed: No Bone Exposed: No Electronic Signature(s) Signed: 06/21/2019 5:40:44 PM By: Gretta Cool, BSN, RN, CWS, Kim RN, BSN Entered By: Gretta Cool, BSN, RN, CWS, Kim on 06/10/2019 15:11:57 Bonnie Kane ([id number]) -------------------------------------------------------------------------------- Wound Assessment Details Patient Name: Bonnie Kane Date of Service: 06/10/2019 2:30 PM Medical Record Number: [id number] Patient Account Number: [id number] Date of Birth/Sex: [birth year] (74 y.o. F) Treating RN: Cornell Barman Primary Care Jerid Catherman: Shawnie Dapper Other Clinician: Referring Nino Amano: Shawnie Dapper Treating Jordynn Perrier/Extender: Melburn Hake, HOYT Weeks in Treatment: 2 Wound Status Wound Number: 4 Primary Venous Leg Ulcer Etiology: Wound Location: Left Malleolus - Medial Wound Healed - Epithelialized Wounding Event: Blister Status: Date Acquired: 05/01/2019 Comorbid Type II Diabetes, Osteoarthritis, Neuropathy, Received Weeks Of Treatment: 2 History: Chemotherapy, Received Radiation Clustered Wound: No Photos Wound Measurements Length: (cm) Width: (cm) Depth: (cm) Area:  (cm) Volume: (cm) 0 % Reduction in Area: 100% 0 % Reduction in Volume: 100% 0 Epithelialization: Large (67-100%) 0 Tunneling: No 0 Undermining: No Wound Description Classification: Full Thickness Without Exposed Support Structu res Engineer, maintenance) Signed: 06/21/2019 5:40:44 PM By: Gretta Cool, BSN, RN, CWS, Kim RN, BSN Entered By: Gretta Cool, BSN, RN, CWS, Kim on 06/10/2019 15:12:47 Bonnie Kane ([id number]) -------------------------------------------------------------------------------- Wound Assessment Details Patient Name: Bonnie Kane Date of Service: 06/10/2019 2:30 PM Medical Record Number: [id number] Patient Account Number: [id number] Date of Birth/Sex: 12-06-1945 (74 y.o. F) Treating RN: Cornell Barman Primary Care Sye Schroepfer: Shawnie Dapper Other Clinician: Referring Burman Bruington: Shawnie Dapper Treating Kala Ambriz/Extender: STONE III, HOYT Weeks in Treatment: 2 Wound Status Wound Number: 5 Primary Venous Leg Ulcer Etiology: Wound Location: Left Malleolus -  Lateral Wound Healed - Epithelialized Wounding Event: Blister Status: Date Acquired: 05/01/2019 Comorbid Type II Diabetes, Osteoarthritis, Neuropathy, Received Weeks Of Treatment: 2 History: Chemotherapy, Received Radiation Clustered Wound: No Photos Wound Measurements Length: (cm) Width: (cm) Depth: (cm) Area: (cm) Volume: (cm) 0 % Reduction in Area: 100% 0 % Reduction in Volume: 100% 0 Epithelialization: Large (67-100%) 0 0 Wound Description Classification: Full Thickness Without Exposed Support Structu res Engineer, maintenance) Signed: 06/21/2019 5:40:44 PM By: Gretta Cool, BSN, RN, CWS, Kim RN, BSN Entered By: Gretta Cool, BSN, RN, CWS, Kim on 06/10/2019 15:13:34 Bonnie Kane ([id number]) -------------------------------------------------------------------------------- Wound Assessment Details Patient Name: Bonnie Kane Date of Service: 06/10/2019 2:30 PM Medical Record Number: [id number] Patient Account Number:  [id number] Date of Birth/Sex: 1945-11-22 (74 y.o. F) Treating RN: Cornell Barman Primary Care Athziry Millican: Shawnie Dapper Other Clinician: Referring Yianna Tersigni: Shawnie Dapper Treating Carly Sabo/Extender: Melburn Hake, HOYT Weeks in Treatment: 2 Wound Status Wound Number: 6 Primary Diabetic Wound/Ulcer of the Lower Extremity Etiology: Wound Location: Right Foot - Dorsal Wound Open Wounding Event: Gradually Appeared Status: Date Acquired: 06/03/2019 Comorbid Type II Diabetes, Osteoarthritis, Neuropathy, Received Weeks Of Treatment: 0 History: Chemotherapy, Received Radiation Clustered Wound: No Photos Wound Measurements Length: (cm) 0.4 Width: (cm) 0.5 Depth: (cm) 0.1 Area: (cm) 0.157 Volume: (cm) 0.016 % Reduction in Area: % Reduction in Volume: Epithelialization: None Tunneling: No Undermining: No Wound Description Classification: Grade 1 Wound Margin: Flat and Intact Exudate Amount: Medium Foul Odor After Cleansing: No Slough/Fibrino Yes Wound Bed Granulation Amount: None Present (0%) Exposed Structure Necrotic Amount: Large (67-100%) Fascia Exposed: No Necrotic Quality: Adherent Slough Fat Layer (Subcutaneous Tissue) Exposed: Yes Tendon Exposed: No Muscle Exposed: No Joint Exposed: No Bone Exposed: No Electronic Signature(s) Signed: 06/21/2019 5:40:44 PM By: Gretta Cool, BSN, RN, CWS, Kim RN, BSN Entered By: Gretta Cool, BSN, RN, CWS, Kim on 06/10/2019 15:15:45 Bonnie Kane ([id number]) -------------------------------------------------------------------------------- Vitals Details Patient Name: Bonnie Kane Date of Service: 06/10/2019 2:30 PM Medical Record Number: [id number] Patient Account Number: [id number] Date of Birth/Sex: 27-Dec-1945 (74 y.o. F) Treating RN: Cornell Barman Primary Care Keyandre Pileggi: Shawnie Dapper Other Clinician: Referring Kalisa Girtman: Shawnie Dapper Treating Rylee Huestis/Extender: Melburn Hake, HOYT Weeks in Treatment: 2 Vital Signs Time Taken: 14:35 Temperature (F):  98.3 Height (in): 64 Pulse (bpm): 86 Weight (lbs): 250 Respiratory Rate (breaths/min): 18 Body Mass Index (BMI): 42.9 Blood Pressure (mmHg): 166/57 Reference Range: 80 - 120 mg / dl Electronic Signature(s) Signed: 06/10/2019 4:34:46 PM By: Lorine Bears RCP, RRT, CHT Entered By: Lorine Bears on 06/10/2019 14:39:09

## 2019-06-10 NOTE — Telephone Encounter (Signed)
Vicodin eRx'd. 

## 2019-06-10 NOTE — Telephone Encounter (Signed)
I'll eRx 30d supply of cymbalta for her to try IF she is unable to take the gabapentin. Needs in person f/u 1 mo.

## 2019-06-10 NOTE — Progress Notes (Addendum)
Bonnie Kane, Bonnie Kane ([id number]) Visit Report for 06/10/2019 Chief Complaint Document Details Patient Name: Bonnie Kane, Bonnie Kane Date of Service: 06/10/2019 2:30 PM Medical Record Number: [id number] Patient Account Number: [id number] Date of Birth/Sex: 12/21/1945 (74 y.o. F) Treating RN: Cornell Barman Primary Care Provider: Shawnie Dapper Other Clinician: Referring Provider: Shawnie Dapper Treating Provider/Extender: Melburn Hake, Talullah Abate Weeks in Treatment: 2 Information Obtained from: Patient Chief Complaint Left LE Blisters and Edema Electronic Signature(s) Signed: 06/10/2019 2:23:56 PM By: Worthy Keeler PA-C Entered By: Worthy Keeler on 06/10/2019 14:23:55 Bonnie Kane ([id number]) -------------------------------------------------------------------------------- Debridement Details Patient Name: Bonnie Kane Date of Service: 06/10/2019 2:30 PM Medical Record Number: [id number] Patient Account Number: [id number] Date of Birth/Sex: April 29, 1945 (74 y.o. F) Treating RN: Army Melia Primary Care Provider: Shawnie Dapper Other Clinician: Referring Provider: Shawnie Dapper Treating Provider/Extender: Melburn Hake, Mozell Hardacre Weeks in Treatment: 2 Debridement Performed for Wound #6 Right,Dorsal Foot Assessment: Performed By: Physician STONE III, Amisha Pospisil E., PA-C Debridement Type: Debridement Severity of Tissue Pre Debridement: Fat layer exposed Level of Consciousness (Pre- Awake and Alert procedure): Pre-procedure Verification/Time Out Yes - 15:35 Taken: Start Time: 15:34 Pain Control: Lidocaine Total Area Debrided (L x W): 0.4 (cm) x 0.5 (cm) = 0.2 (cm) Tissue and other material debrided: Viable, Non-Viable, Slough, Subcutaneous, Slough Level: Skin/Subcutaneous Tissue Debridement Description: Excisional Instrument: Curette Bleeding: None End Time: 15:36 Response to Treatment: Procedure was tolerated well Level of Consciousness (Post- Awake and Alert procedure): Post Debridement Measurements of Total  Wound Length: (cm) 0.4 Width: (cm) 0.5 Depth: (cm) 0.1 Volume: (cm) 0.016 Character of Wound/Ulcer Post Debridement: Stable Severity of Tissue Post Debridement: Fat layer exposed Post Procedure Diagnosis Same as Pre-procedure Electronic Signature(s) Signed: 06/10/2019 4:40:14 PM By: Army Melia Signed: 06/10/2019 5:29:33 PM By: Worthy Keeler PA-C Entered By: Army Melia on 06/10/2019 15:36:11 Bonnie Kane ([id number]) -------------------------------------------------------------------------------- HPI Details Patient Name: Bonnie Kane Date of Service: 06/10/2019 2:30 PM Medical Record Number: [id number] Patient Account Number: [id number] Date of Birth/Sex: Nov 21, 1945 (74 y.o. F) Treating RN: Cornell Barman Primary Care Provider: Shawnie Dapper Other Clinician: Referring Provider: Shawnie Dapper Treating Provider/Extender: Melburn Hake, Leeland Lovelady Weeks in Treatment: 2 History of Present Illness HPI Description: 05/24/19 patient presents today for initial evaluation here in our clinic as a referral from Dr. Julien Nordmann who is her family medicine practitioner. He has been taking care of this patient for several weeks now it appears. At least since it appears December 2020 although she has had some issue for quite some time. She has seen the vascular surgeon at [facility] and did have ABIs and TBI's which were performed back at that point. That was in September 2020. With that being said unfortunately she did not show signs of low ABIs and TBI's on the left which is where the blistering is taking place at this point her right seem to be in good shape as far as the results were concerned. She had a right ABI of 1.17 with a TBI of 0.85 and a left ABI of 0.68 with a TBI of 0.43. This is at least moderate disease. The patient does have diabetes although apparently this is diet controlled according to what they tell me at this point. She does also have congestive heart failure,  bilateral lower extremity edema with a history of chronic venous stasis, systolic heart murmur, hypertension, and appears to have also had a transcatheter aortic valve replacement. Was on April 2018. The patient has been on doxycycline initially by Dr. Ernestine Conrad and then subsequently this was  switched just yesterday to Cipro. He does note in his note yesterday that she had improved but he still recommended that she come to the wound care center for further evaluation and treatment. He did also on top given her the Cipro to cover the Serratia give her Vicodin for helping with the pain he also performed a basic metabolic panel and a CBC yesterday as well. He does have a history of having been recommended compression stockings in the past although apparently this ripped skin off when they remove them according to the patient and her husband. He is present during the evaluation today as well. Is also do CBC which actually appear to be doing fairly well the white blood cell count was 7.0 and appears normal. The metabolic panel did show an elevation in the BUN and creatinine as well as the ratio at this point her glucose was 231 not be fasting but still that is an elevation range. Right now the patient is taking 40 mg of Lasix in the morning and 20 mg in the evening. He is also get a recheck the metabolic panel in a week. He notes that her kidney function was just a little bit down compared to her baseline. His most recent hemoglobin A1c was 7.2 on October 25, 2018. That was found in care everywhere from [place]. Patient's wound culture was actually performed just 4 days ago and again did show Serratia as the causative organism for the infection currently and obviously doxycycline is not to work for this that is why she was switched to Cipro. 05/31/2019 upon evaluation today patient appears to be doing well at this time with regard to her wounds on the left lower extremity. Several of  the blisters that she did have have also significantly improved. These have ruptured but do not appear to be spreading and a lot of the tissue though I think will come off I do not think we have to rush as far as this is concerned she is having a lot of discomfort still that really is disproportionate even to her wounds which seem to be improving. She did get the Farrow wrap although her husband states that she is not good to be able to wear the sock portion that goes underneath as it has something covering her toes she apparently is unable to wear anything that covers her toes. He states he is been married for close to 50 years and is never seen her where anything that even resembles a sock. 06/10/2019 upon evaluation today patient appears to be doing well with regard to her wounds at this point. She is still experiencing significant pain currently that I do believe is more neuropathic in nature I discussed this previous. Her wounds are healing quite nicely and to be honest her pain is disproportionate to the open wounds that she actually has present at this point she also does not appear to be showing any signs of infection. She does have numbness in her toes and then subsequently with regard to her wounds and even some areas that are not even open she has significant pain which I would describe is more neuropathic/nerve in nature. This was discussed with her and her husband yet again today. I did explain again today that I do not believe simply performing venous ablation is good to take care of this pain either. I did discuss possible options including since she has had issues in the past with other medications possibly trying Cymbalta her  twin brother actually has a similar issue with neuropathy and neuropathic pain and takes Cymbalta with good result. I think that is at least a possibility for her to be honest. She could talk with her primary care provider about it we do not prescribe that  medication at the wound center just due to the nature of follow-up this necessary for that medicine. Electronic Signature(s) Signed: 06/10/2019 5:21:41 PM By: Worthy Keeler PA-C Entered By: Worthy Keeler on 06/10/2019 17:21:41 Bonnie Kane, Bonnie Kane ([id number]) -------------------------------------------------------------------------------- Physical Exam Details Patient Name: Bonnie Kane Date of Service: 06/10/2019 2:30 PM Medical Record Number: [id number] Patient Account Number: [id number] Date of Birth/Sex: 11/06/1945 (74 y.o. F) Treating RN: Cornell Barman Primary Care Provider: Shawnie Dapper Other Clinician: Referring Provider: Shawnie Dapper Treating Provider/Extender: STONE III, Varetta Chavers Weeks in Treatment: 2 Constitutional Obese and well-hydrated in no acute distress. Respiratory normal breathing without difficulty. Psychiatric this patient is able to make decisions and demonstrates good insight into disease process. Alert and Oriented x 3. pleasant and cooperative. Notes Patient's wound bed currently showed signs of good granulation at this time and epithelization was noted over several of the wound locations. Overall I am very pleased. She did have 1 area on the right foot that is going require sharp debridement today that was performed with pain although she did not have any significant bleeding and it was very superficial as far as the actual debridement was concerned. Nonetheless post debridement she seems to be doing much better which is great. Electronic Signature(s) Signed: 06/10/2019 5:22:47 PM By: Worthy Keeler PA-C Entered By: Worthy Keeler on 06/10/2019 17:22:46 Bonnie Kane ([id number]) -------------------------------------------------------------------------------- Physician Orders Details Patient Name: Bonnie Kane Date of Service: 06/10/2019 2:30 PM Medical Record Number: [id number] Patient Account Number: [id number] Date of Birth/Sex: [birth year] (74 y.o. F) Treating RN:  Army Melia Primary Care Provider: Shawnie Dapper Other Clinician: Referring Provider: Shawnie Dapper Treating Provider/Extender: Melburn Hake, Valbona Slabach Weeks in Treatment: 2 Verbal / Phone Orders: No Diagnosis Coding ICD-10 Coding Code Description I87.2 Venous insufficiency (chronic) (peripheral) I89.0 Lymphedema, not elsewhere classified I73.89 Other specified peripheral vascular diseases L97.821 Non-pressure chronic ulcer of other part of left lower leg limited to breakdown of skin L97.521 Non-pressure chronic ulcer of other part of left foot limited to breakdown of skin I50.42 Chronic combined systolic (congestive) and diastolic (congestive) heart failure E11.621 Type 2 diabetes mellitus with foot ulcer Wound Cleansing Wound #1 Left Lower Leg o Clean wound with Normal Saline. o Dial antibacterial soap, wash wounds, rinse and pat dry prior to dressing wounds o May Shower, gently pat wound dry prior to applying new dressing. Wound #2 Left,Proximal Lower Leg o Clean wound with Normal Saline. o Dial antibacterial soap, wash wounds, rinse and pat dry prior to dressing wounds o May Shower, gently pat wound dry prior to applying new dressing. Wound #3 Left,Dorsal Foot o Clean wound with Normal Saline. o Dial antibacterial soap, wash wounds, rinse and pat dry prior to dressing wounds o May Shower, gently pat wound dry prior to applying new dressing. Wound #6 Right,Dorsal Foot o Clean wound with Normal Saline. o Dial antibacterial soap, wash wounds, rinse and pat dry prior to dressing wounds o May Shower, gently pat wound dry prior to applying new dressing. Primary Wound Dressing Wound #1 Left Lower Leg o Silver Alginate Wound #2 Left,Proximal Lower Leg o Silver Alginate Wound #3 Left,Dorsal Foot o Silver Alginate Wound #6 Right,Dorsal Foot o Silver Collagen Secondary Dressing Wound #1 Left Lower Leg o ABD  and Kerlix/Conform Wound #2 Left,Proximal  Lower Leg o ABD and Kerlix/Conform Wound #3 Left,Dorsal Foot Kopper, Relena ([id number]) o ABD and Kerlix/Conform Wound #6 Right,Dorsal Foot o ABD and Kerlix/Conform Dressing Change Frequency Wound #1 Left Lower Leg o Change dressing every other day. Wound #2 Left,Proximal Lower Leg o Change dressing every other day. Wound #3 Left,Dorsal Foot o Change dressing every other day. Wound #6 Right,Dorsal Foot o Change dressing every other day. Follow-up Appointments Wound #1 Left Lower Leg o Return Appointment in 1 week. Wound #2 Left,Proximal Lower Leg o Return Appointment in 1 week. Wound #3 Left,Dorsal Foot o Return Appointment in 1 week. Wound #6 Right,Dorsal Foot o Return Appointment in 1 week. Edema Control Wound #1 Left Lower Leg o Elevate legs to the level of the heart and pump ankles as often as possible o Other: - TubiGrip F Wound #2 Left,Proximal Lower Leg o Elevate legs to the level of the heart and pump ankles as often as possible o Other: - TubiGrip F Wound #3 Left,Dorsal Foot o Elevate legs to the level of the heart and pump ankles as often as possible o Other: - TubiGrip F Wound #6 Right,Dorsal Foot o Elevate legs to the level of the heart and pump ankles as often as possible o Other: - TubiGrip F Electronic Signature(s) Signed: 06/10/2019 4:40:14 PM By: Army Melia Signed: 06/10/2019 5:29:33 PM By: Worthy Keeler PA-C Entered By: Army Melia on 06/10/2019 15:37:47 Bonnie Kane ([id number]) -------------------------------------------------------------------------------- Problem List Details Patient Name: Bonnie Kane Date of Service: 06/10/2019 2:30 PM Medical Record Number: [id number] Patient Account Number: [id number] Date of Birth/Sex: May 20, 1945 (74 y.o. F) Treating RN: Cornell Barman Primary Care Provider: Shawnie Dapper Other Clinician: Referring Provider: Shawnie Dapper Treating Provider/Extender: Melburn Hake, Jax Abdelrahman Weeks  in Treatment: 2 Active Problems ICD-10 Evaluated Encounter Code Description Active Date Today Diagnosis I87.2 Venous insufficiency (chronic) (peripheral) 05/24/2019 No Yes I89.0 Lymphedema, not elsewhere classified 05/24/2019 No Yes I73.89 Other specified peripheral vascular diseases 05/24/2019 No Yes L97.821 Non-pressure chronic ulcer of other part of left lower leg limited to 05/24/2019 No Yes breakdown of skin L97.521 Non-pressure chronic ulcer of other part of left foot limited to breakdown of 05/24/2019 No Yes skin L97.512 Non-pressure chronic ulcer of other part of right foot with fat layer exposed 06/10/2019 No Yes I50.42 Chronic combined systolic (congestive) and diastolic (congestive) heart 05/24/2019 No Yes failure E11.621 Type 2 diabetes mellitus with foot ulcer 05/24/2019 No Yes Inactive Problems Resolved Problems Electronic Signature(s) Signed: 06/10/2019 5:24:44 PM By: Worthy Keeler PA-C Previous Signature: 06/10/2019 2:23:49 PM Version By: Worthy Keeler PA-C Entered By: Worthy Keeler on 06/10/2019 17:24:43 Bonnie Kane ([id number]) -------------------------------------------------------------------------------- Progress Note Details Patient Name: Bonnie Kane Date of Service: 06/10/2019 2:30 PM Medical Record Number: [id number] Patient Account Number: [id number] Date of Birth/Sex: 1945/06/10 (74 y.o. F) Treating RN: Cornell Barman Primary Care Provider: Shawnie Dapper Other Clinician: Referring Provider: Shawnie Dapper Treating Provider/Extender: Melburn Hake, Ariana Cavenaugh Weeks in Treatment: 2 Subjective Chief Complaint Information obtained from Patient Left LE Blisters and Edema History of Present Illness (HPI) 05/24/19 patient presents today for initial evaluation here in our clinic as a referral from Dr. Julien Nordmann who is her family medicine practitioner. He has been taking care of this patient for several weeks now it appears. At least since it appears December 2020 although she has  had some issue for quite some time. She has seen the vascular surgeon at [facility] and did have ABIs and TBI's  which were performed back at that point. That was in September 2020. With that being said unfortunately she did not show signs of low ABIs and TBI's on the left which is where the blistering is taking place at this point her right seem to be in good shape as far as the results were concerned. She had a right ABI of 1.17 with a TBI of 0.85 and a left ABI of 0.68 with a TBI of 0.43. This is at least moderate disease. The patient does have diabetes although apparently this is diet controlled according to what they tell me at this point. She does also have congestive heart failure, bilateral lower extremity edema with a history of chronic venous stasis, systolic heart murmur, hypertension, and appears to have also had a transcatheter aortic valve replacement. Was on April 2018. The patient has been on doxycycline initially by Dr. Ernestine Conrad and then subsequently this was switched just yesterday to Cipro. He does note in his note yesterday that she had improved but he still recommended that she come to the wound care center for further evaluation and treatment. He did also on top given her the Cipro to cover the Serratia give her Vicodin for helping with the pain he also performed a basic metabolic panel and a CBC yesterday as well. He does have a history of having been recommended compression stockings in the past although apparently this ripped skin off when they remove them according to the patient and her husband. He is present during the evaluation today as well. Is also do CBC which actually appear to be doing fairly well the white blood cell count was 7.0 and appears normal. The metabolic panel did show an elevation in the BUN and creatinine as well as the ratio at this point her glucose was 231 not be fasting but still that is an elevation range. Right now the patient is  taking 40 mg of Lasix in the morning and 20 mg in the evening. He is also get a recheck the metabolic panel in a week. He notes that her kidney function was just a little bit down compared to her baseline. His most recent hemoglobin A1c was 7.2 on October 25, 2018. That was found in care everywhere from [place]. Patient's wound culture was actually performed just 4 days ago and again did show Serratia as the causative organism for the infection currently and obviously doxycycline is not to work for this that is why she was switched to Cipro. 05/31/2019 upon evaluation today patient appears to be doing well at this time with regard to her wounds on the left lower extremity. Several of the blisters that she did have have also significantly improved. These have ruptured but do not appear to be spreading and a lot of the tissue though I think will come off I do not think we have to rush as far as this is concerned she is having a lot of discomfort still that really is disproportionate even to her wounds which seem to be improving. She did get the Farrow wrap although her husband states that she is not good to be able to wear the sock portion that goes underneath as it has something covering her toes she apparently is unable to wear anything that covers her toes. He states he is been married for close to 50 years and is never seen her where anything that even resembles a sock. 06/10/2019 upon evaluation today patient appears to be doing well with  regard to her wounds at this point. She is still experiencing significant pain currently that I do believe is more neuropathic in nature I discussed this previous. Her wounds are healing quite nicely and to be honest her pain is disproportionate to the open wounds that she actually has present at this point she also does not appear to be showing any signs of infection. She does have numbness in her toes and then subsequently with regard to her  wounds and even some areas that are not even open she has significant pain which I would describe is more neuropathic/nerve in nature. This was discussed with her and her husband yet again today. I did explain again today that I do not believe simply performing venous ablation is good to take care of this pain either. I did discuss possible options including since she has had issues in the past with other medications possibly trying Cymbalta her twin brother actually has a similar issue with neuropathy and neuropathic pain and takes Cymbalta with good result. I think that is at least a possibility for her to be honest. She could talk with her primary care provider about it we do not prescribe that medication at the wound center just due to the nature of follow-up this necessary for that medicine. Objective Constitutional Obese and well-hydrated in no acute distress. Vitals Time Taken: 2:35 PM, Height: 64 in, Weight: 250 lbs, BMI: 42.9, Temperature: 98.3 F, Pulse: 86 bpm, Respiratory Rate: 18 breaths/min, Blood Pressure: 166/57 mmHg. Bonnie Kane, Bonnie Kane ([id number]) Respiratory normal breathing without difficulty. Psychiatric this patient is able to make decisions and demonstrates good insight into disease process. Alert and Oriented x 3. pleasant and cooperative. General Notes: Patient's wound bed currently showed signs of good granulation at this time and epithelization was noted over several of the wound locations. Overall I am very pleased. She did have 1 area on the right foot that is going require sharp debridement today that was performed with pain although she did not have any significant bleeding and it was very superficial as far as the actual debridement was concerned. Nonetheless post debridement she seems to be doing much better which is great. Integumentary (Hair, Skin) Wound #1 status is Open. Original cause of wound was Blister. The wound is located on the Left Lower Leg. The wound  measures 2.2cm length x 3.5cm width x 0.1cm depth; 6.048cm^2 area and 0.605cm^3 volume. There is Fat Layer (Subcutaneous Tissue) Exposed exposed. There is no tunneling noted. There is a medium amount of serous drainage noted. The wound margin is flat and intact. There is medium (34-66%) granulation within the wound bed. There is a medium (34-66%) amount of necrotic tissue within the wound bed including Adherent Slough. Wound #2 status is Open. Original cause of wound was Blister. The wound is located on the Left,Proximal Lower Leg. The wound measures 1.5cm length x 2cm width x 0.1cm depth; 2.356cm^2 area and 0.236cm^3 volume. There is Fat Layer (Subcutaneous Tissue) Exposed exposed. There is no tunneling or undermining noted. There is a medium amount of serous drainage noted. The wound margin is flat and intact. There is medium (34-66%) pink granulation within the wound bed. There is a medium (34-66%) amount of necrotic tissue within the wound bed including Adherent Slough. Wound #3 status is Open. Original cause of wound was Blister. The wound is located on the Left,Dorsal Foot. The wound measures 0.7cm length x 0.8cm width x 0.1cm depth; 0.44cm^2 area and 0.044cm^3 volume. There is Fat Layer (Subcutaneous Tissue)  Exposed exposed. There is no tunneling or undermining noted. There is a medium amount of serous drainage noted. The wound margin is flat and intact. There is no granulation within the wound bed. There is a large (67-100%) amount of necrotic tissue within the wound bed including Adherent Slough. Wound #4 status is Healed - Epithelialized. Original cause of wound was Blister. The wound is located on the Left,Medial Malleolus. The wound measures 0cm length x 0cm width x 0cm depth; 0cm^2 area and 0cm^3 volume. There is no tunneling or undermining noted. Wound #5 status is Healed - Epithelialized. Original cause of wound was Blister. The wound is located on the Left,Lateral Malleolus. The  wound measures 0cm length x 0cm width x 0cm depth; 0cm^2 area and 0cm^3 volume. Wound #6 status is Open. Original cause of wound was Gradually Appeared. The wound is located on the Right,Dorsal Foot. The wound measures 0.4cm length x 0.5cm width x 0.1cm depth; 0.157cm^2 area and 0.016cm^3 volume. There is Fat Layer (Subcutaneous Tissue) Exposed exposed. There is no tunneling or undermining noted. There is a medium amount of drainage noted. The wound margin is flat and intact. There is no granulation within the wound bed. There is a large (67-100%) amount of necrotic tissue within the wound bed including Adherent Slough. 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 There was a Excisional Skin/Subcutaneous Tissue Debridement with a total area of 0.2 sq cm performed by STONE III, Marvell Stavola E., PA-C. With the following instrument(s): Curette to remove Viable and Non-Viable tissue/material. Material removed includes Subcutaneous Tissue and Slough and after achieving pain control using Lidocaine. A time out was conducted at 15:35, prior to the start of the procedure. There was no bleeding. The procedure was tolerated well. Post Debridement Measurements: 0.4cm length x 0.5cm width x 0.1cm depth; Bonnie Kane, Bonnie Kane ([id number]) 0.016cm^3  volume. Character of Wound/Ulcer Post Debridement is stable. Severity of Tissue Post Debridement is: Fat layer exposed. Post procedure Diagnosis Wound #6: Same as Pre-Procedure Plan Wound Cleansing: Wound #1 Left Lower Leg: Clean wound with Normal Saline. Dial antibacterial soap, wash wounds, rinse and pat dry prior to dressing wounds May Shower, gently pat wound dry prior to applying new dressing. Wound #2 Left,Proximal Lower Leg: Clean wound with Normal Saline. Dial antibacterial soap, wash wounds, rinse and pat dry prior to dressing wounds May Shower, gently pat wound dry prior to applying new dressing. Wound #3 Left,Dorsal Foot: Clean wound with Normal Saline. Dial antibacterial soap, wash wounds, rinse and pat dry prior to dressing wounds May Shower, gently pat wound dry prior to applying new dressing. Wound #6 Right,Dorsal Foot: Clean wound with Normal Saline. Dial antibacterial soap, wash wounds, rinse and pat dry prior to dressing wounds May Shower, gently pat wound dry prior to applying new dressing. Primary Wound Dressing: Wound #1 Left Lower Leg: Silver Alginate Wound #2 Left,Proximal Lower Leg: Silver Alginate Wound #3 Left,Dorsal Foot: Silver Alginate Wound #6 Right,Dorsal  Foot: Silver Collagen Secondary Dressing: Wound #1 Left Lower Leg: ABD and Kerlix/Conform Wound #2 Left,Proximal Lower Leg: ABD and Kerlix/Conform Wound #3 Left,Dorsal Foot: ABD and Kerlix/Conform Wound #6 Right,Dorsal Foot: ABD and Kerlix/Conform Dressing Change Frequency: Wound #1 Left Lower Leg: Change dressing every other day. Wound #2 Left,Proximal Lower Leg: Change dressing every other day. Wound #3 Left,Dorsal Foot: Change dressing every other day. Wound #6 Right,Dorsal Foot: Change dressing every other day. Follow-up Appointments: Wound #1 Left Lower Leg: Return Appointment in 1 week. Wound #2 Left,Proximal Lower Leg: Return Appointment in 1 week. Wound #3 Left,Dorsal  Foot: Return Appointment in 1 week. Wound #6 Right,Dorsal Foot: Return Appointment in 1 week. Edema Control: Wound #1 Left Lower Leg: Elevate legs to the level of the heart and pump ankles as often as possible Other: - TubiGrip F Wound #2 Left,Proximal Lower Leg: Elevate legs to the level of the heart and pump ankles as often as possible Other: Bonnie Kane, Bonnie Kane ([id number]) Wound #3 Left,Dorsal Foot: Elevate legs to the level of the heart and pump ankles as often as possible Other: - TubiGrip F Wound #6 Right,Dorsal Foot: Elevate legs to the level of the heart and pump ankles as often as possible Other: - TubiGrip F 1. My suggestion at this time is can be that we go ahead and continue with the current wound care measures which includes cleaning with Dial antibacterial soap followed by application of silver alginate to the wound beds. This is at all locations except for the right dorsal foot where we will use silver collagen. 2. I would recommend as well that we continue with the Tubigrip to the legs bilaterally hopefully this will continue to do well as far as keeping her edema under control. 3. I am also can recommend that she elevate her legs much as possible to Help with Edema Control As Well. We will see patient back for reevaluation in 1 week here in the clinic. If anything worsens or changes patient will contact our office for additional recommendations. Electronic Signature(s) Signed: 06/10/2019 5:28:54 PM By: Worthy Keeler PA-C Previous Signature: 06/10/2019 5:23:42 PM Version By: Worthy Keeler PA-C Entered By: Worthy Keeler on 06/10/2019 17:28:54 Bonnie Kane ([id number]) -------------------------------------------------------------------------------- SuperBill Details Patient Name: Bonnie Kane Date of Service: 06/10/2019 Medical Record Number: [id number] Patient Account Number: [id number] Date of Birth/Sex: [birth year] (74 y.o. F) Treating RN: Cornell Barman Primary  Care Provider: Shawnie Dapper Other Clinician: Referring Provider: Shawnie Dapper Treating Provider/Extender: Melburn Hake, Arie Powell Weeks in Treatment: 2 Diagnosis Coding ICD-10 Codes Code Description I87.2 Venous insufficiency (chronic) (peripheral) I89.0 Lymphedema, not elsewhere classified I73.89 Other specified peripheral vascular diseases L97.821 Non-pressure chronic ulcer of other part of left lower leg limited to breakdown of skin L97.521 Non-pressure chronic ulcer of other part of left foot limited to breakdown of skin L97.512 Non-pressure chronic ulcer of other part of right foot with fat layer exposed I50.42 Chronic combined systolic (congestive) and diastolic (congestive) heart failure E11.621 Type 2 diabetes mellitus with foot ulcer Facility Procedures CPT4 Code: [id number] Description: 11042 - DEB SUBQ TISSUE 20 SQ CM/< Modifier: Quantity: 1 CPT4 Code: Description: ICD-10 Diagnosis Description L97.512 Non-pressure chronic ulcer of other part of right foot with fat layer expos Modifier: ed Quantity: Physician Procedures CPT4 [id number] Description: 11042 - WC PHYS SUBQ TISS 20 SQ CM Modifier: Quantity: 1 CPT4 Code: Description: ICD-10 Diagnosis Description L97.512 Non-pressure chronic ulcer of other part of right foot with fat layer expose Modifier: d Quantity:  Electronic Signature(s) Signed: 06/10/2019 5:29:10 PM By: Worthy Keeler PA-C Entered By: Worthy Keeler on 06/10/2019 17:29:10

## 2019-06-10 NOTE — Telephone Encounter (Signed)
Patient notified via My Chart

## 2019-06-10 NOTE — Telephone Encounter (Signed)
Okay for 30 day trial of Cymbalta? Please advise, thanks.

## 2019-06-10 NOTE — Telephone Encounter (Signed)
RF request for hydrocodone. Last OV 05/23/19 Last RX dated fill on or after 05/26/19 # 30  Okay to refill?

## 2019-06-11 NOTE — Telephone Encounter (Signed)
Pt advised, 1 mo f/u scheduled.

## 2019-06-17 DIAGNOSIS — I872 Venous insufficiency (chronic) (peripheral): Secondary | ICD-10-CM

## 2019-06-17 HISTORY — DX: Venous insufficiency (chronic) (peripheral): I87.2

## 2019-06-17 HISTORY — PX: OTHER SURGICAL HISTORY: SHX169

## 2019-06-18 ENCOUNTER — Encounter: Payer: Medicare Other | Attending: Physician Assistant | Admitting: Physician Assistant

## 2019-06-18 ENCOUNTER — Other Ambulatory Visit: Payer: Self-pay

## 2019-06-18 DIAGNOSIS — L97521 Non-pressure chronic ulcer of other part of left foot limited to breakdown of skin: Secondary | ICD-10-CM | POA: Insufficient documentation

## 2019-06-18 DIAGNOSIS — E11621 Type 2 diabetes mellitus with foot ulcer: Secondary | ICD-10-CM | POA: Diagnosis not present

## 2019-06-18 DIAGNOSIS — I872 Venous insufficiency (chronic) (peripheral): Secondary | ICD-10-CM | POA: Diagnosis not present

## 2019-06-18 DIAGNOSIS — I5042 Chronic combined systolic (congestive) and diastolic (congestive) heart failure: Secondary | ICD-10-CM | POA: Diagnosis not present

## 2019-06-18 DIAGNOSIS — I110 Hypertensive heart disease with heart failure: Secondary | ICD-10-CM | POA: Insufficient documentation

## 2019-06-18 DIAGNOSIS — R600 Localized edema: Secondary | ICD-10-CM | POA: Diagnosis not present

## 2019-06-18 DIAGNOSIS — S90822A Blister (nonthermal), left foot, initial encounter: Secondary | ICD-10-CM | POA: Diagnosis not present

## 2019-06-18 DIAGNOSIS — S90821A Blister (nonthermal), right foot, initial encounter: Secondary | ICD-10-CM | POA: Diagnosis not present

## 2019-06-18 DIAGNOSIS — S80822A Blister (nonthermal), left lower leg, initial encounter: Secondary | ICD-10-CM | POA: Diagnosis not present

## 2019-06-18 DIAGNOSIS — L97821 Non-pressure chronic ulcer of other part of left lower leg limited to breakdown of skin: Secondary | ICD-10-CM | POA: Insufficient documentation

## 2019-06-18 DIAGNOSIS — I890 Lymphedema, not elsewhere classified: Secondary | ICD-10-CM | POA: Insufficient documentation

## 2019-06-18 DIAGNOSIS — E1151 Type 2 diabetes mellitus with diabetic peripheral angiopathy without gangrene: Secondary | ICD-10-CM | POA: Insufficient documentation

## 2019-06-18 DIAGNOSIS — L97512 Non-pressure chronic ulcer of other part of right foot with fat layer exposed: Secondary | ICD-10-CM | POA: Insufficient documentation

## 2019-06-18 NOTE — Progress Notes (Addendum)
RADWA, FELLINGER ([id number]) Visit Report for 06/18/2019 Chief Complaint Document Details Patient Name: Bonnie Kane, Bonnie Kane Date of Service: 06/18/2019 3:15 PM Medical Record Number: [id number] Patient Account Number: [id number] Date of Birth/Sex: 1945-08-11 (74 y.o. F) Treating RN: Montey Hora Primary Care Provider: Shawnie Dapper Other Clinician: Referring Provider: Shawnie Dapper Treating Provider/Extender: Melburn Hake, Vance Hochmuth Weeks in Treatment: 3 Information Obtained from: Patient Chief Complaint Left LE Blisters and Edema Electronic Signature(s) Signed: 06/18/2019 4:05:03 PM By: Worthy Keeler PA-C Entered By: Worthy Keeler on 06/18/2019 16:05:03 Bonnie Kane ([id number]) -------------------------------------------------------------------------------- HPI Details Patient Name: Bonnie Kane Date of Service: 06/18/2019 3:15 PM Medical Record Number: [id number] Patient Account Number: [id number] Date of Birth/Sex: August 08, 1945 (73 y.o. F) Treating RN: Montey Hora Primary Care Provider: Shawnie Dapper Other Clinician: Referring Provider: Shawnie Dapper Treating Provider/Extender: Melburn Hake, Weber Monnier Weeks in Treatment: 3 History of Present Illness HPI Description: 05/24/19 patient presents today for initial evaluation here in our clinic as a referral from Dr. Julien Nordmann who is her family medicine practitioner. He has been taking care of this patient for several weeks now it appears. At least since it appears December 2020 although she has had some issue for quite some time. She has seen the vascular surgeon at [place] and did have ABIs and TBI's which were performed back at that point. That was in September 2020. With that being said unfortunately she did not show signs of low ABIs and TBI's on the left which is where the blistering is taking place at this point her right seem to be in good shape as far as the results were concerned. She had a right ABI of 1.17 with a TBI of  0.85 and a left ABI of 0.68 with a TBI of 0.43. This is at least moderate disease. The patient does have diabetes although apparently this is diet controlled according to what they tell me at this point. She does also have congestive heart failure, bilateral lower extremity edema with a history of chronic venous stasis, systolic heart murmur, hypertension, and appears to have also had a transcatheter aortic valve replacement. Was on April 2018. The patient has been on doxycycline initially by Dr. Ernestine Conrad and then subsequently this was switched just yesterday to Cipro. He does note in his note yesterday that she had improved but he still recommended that she come to the wound care center for further evaluation and treatment. He did also on top given her the Cipro to cover the Serratia give her Vicodin for helping with the pain he also performed a basic metabolic panel and a CBC yesterday as well. He does have a history of having been recommended compression stockings in the past although apparently this ripped skin off when they remove them according to the patient and her husband. He is present during the evaluation today as well. Is also do CBC which actually appear to be doing fairly well the white blood cell count was 7.0 and appears normal. The metabolic panel did show an elevation in the BUN and creatinine as well as the ratio at this point her glucose was 231 not be fasting but still that is an elevation range. Right now the patient is taking 40 mg of Lasix in the morning and 20 mg in the evening. He is also get a recheck the metabolic panel in a week. He notes that her kidney function was just a little bit down compared to her baseline. His most recent hemoglobin A1c was 7.2 on October 25, 2018. That was found in care everywhere from [place]. Patient's wound culture was actually performed just 4 days ago and again did show Serratia as the causative organism for the infection  currently and obviously doxycycline is not to work for this that is why she was switched to Cipro. 05/31/2019 upon evaluation today patient appears to be doing well at this time with regard to her wounds on the left lower extremity. Several of the blisters that she did have have also significantly improved. These have ruptured but do not appear to be spreading and a lot of the tissue though I think will come off I do not think we have to rush as far as this is concerned she is having a lot of discomfort still that really is disproportionate even to her wounds which seem to be improving. She did get the Farrow wrap although her husband states that she is not good to be able to wear the sock portion that goes underneath as it has something covering her toes she apparently is unable to wear anything that covers her toes. He states he is been married for close to 50 years and is never seen her where anything that even resembles a sock. 06/10/2019 upon evaluation today patient appears to be doing well with regard to her wounds at this point. She is still experiencing significant pain currently that I do believe is more neuropathic in nature I discussed this previous. Her wounds are healing quite nicely and to be honest her pain is disproportionate to the open wounds that she actually has present at this point she also does not appear to be showing any signs of infection. She does have numbness in her toes and then subsequently with regard to her wounds and even some areas that are not even open she has significant pain which I would describe is more neuropathic/nerve in nature. This was discussed with her and her husband yet again today. I did explain again today that I do not believe simply performing venous ablation is good to take care of this pain either. I did discuss possible options including since she has had issues in the past with other medications possibly trying Cymbalta her twin brother actually  has a similar issue with neuropathy and neuropathic pain and takes Cymbalta with good result. I think that is at least a possibility for her to be honest. She could talk with her primary care provider about it we do not prescribe that medication at the wound center just due to the nature of follow-up this necessary for that medicine. 06/18/2019 upon evaluation today patient appears to be doing well with regard to her wounds in general. I am extremely pleased with the progress that she is made and in fact her wounds seem to be healing quite nicely with compression. With that being said I do believe that she is going need something ongoing as far as compression is concerned. If she does not have ongoing compression then she will obviously revert back to where things were in the end. Nonetheless I do think at minimum she may need to have Tubigrip that she wears ongoing. I would discuss this in greater detail with her next week. For the time being we are more focused on trying to get her wounds healed and again she is definitely doing much better in that regard. Unfortunately I am told that when they change this that she has excruciating pain for 8 hours I really not sure  why she will be having this pain obviously there is no reason that the silver alginate really should be causing pain like this and again her pain again appears to be disproportionate to the wounds at this point especially considering there healing so well. For that reason I really think that this may be Morbid just changing the dressings in general. Electronic Signature(s) Signed: 06/18/2019 5:07:13 PM By: Worthy Keeler PA-C Entered By: Worthy Keeler on 06/18/2019 17:07:13 KEMIYAH, SILA ([id number]) -------------------------------------------------------------------------------- Physical Exam Details Patient Name: Bonnie Kane Date of Service: 06/18/2019 3:15 PM Medical Record Number: [id number] Patient Account Number: [id number] Date of  Birth/Sex: 05-25-1945 (73 y.o. F) Treating RN: Montey Hora Primary Care Provider: Shawnie Dapper Other Clinician: Referring Provider: Shawnie Dapper Treating Provider/Extender: STONE III, Zoiee Wimmer Weeks in Treatment: 3 Constitutional Obese and well-hydrated in no acute distress. Respiratory normal breathing without difficulty. Psychiatric this patient is able to make decisions and demonstrates good insight into disease process. Alert and Oriented x 3. pleasant and cooperative. Notes Upon inspection patient's wound actually appears to be doing quite well at all locations. In fact she is healed in some locations and seems to be doing excellent. Other areas where she has not healed still have improved and overall I am extremely pleased in this regard. I think she is made great progress in just a few weeks that she has been here up to this point. Nonetheless I do think she is going to require ongoing compression of one type or another. Electronic Signature(s) Signed: 06/18/2019 5:07:56 PM By: Worthy Keeler PA-C Entered By: Worthy Keeler on 06/18/2019 17:07:56 Bonnie Kane ([id number]) -------------------------------------------------------------------------------- Physician Orders Details Patient Name: Bonnie Kane Date of Service: 06/18/2019 3:15 PM Medical Record Number: [id number] Patient Account Number: [id number] Date of Birth/Sex: Apr 29, 1945 (73 y.o. F) Treating RN: Montey Hora Primary Care Provider: Shawnie Dapper Other Clinician: Referring Provider: Shawnie Dapper Treating Provider/Extender: Melburn Hake, Exzavier Ruderman Weeks in Treatment: 3 Verbal / Phone Orders: No Diagnosis Coding ICD-10 Coding Code Description I87.2 Venous insufficiency (chronic) (peripheral) I89.0 Lymphedema, not elsewhere classified I73.89 Other specified peripheral vascular diseases L97.821 Non-pressure chronic ulcer of other part of left lower leg limited to breakdown of skin L97.521 Non-pressure chronic  ulcer of other part of left foot limited to breakdown of skin L97.512 Non-pressure chronic ulcer of other part of right foot with fat layer exposed I50.42 Chronic combined systolic (congestive) and diastolic (congestive) heart failure E11.621 Type 2 diabetes mellitus with foot ulcer Wound Cleansing Wound #1 Left Lower Leg o Clean wound with Normal Saline. o Dial antibacterial soap, wash wounds, rinse and pat dry prior to dressing wounds o May Shower, gently pat wound dry prior to applying new dressing. Wound #2 Left,Proximal Lower Leg o Clean wound with Normal Saline. o Dial antibacterial soap, wash wounds, rinse and pat dry prior to dressing wounds o May Shower, gently pat wound dry prior to applying new dressing. Wound #6 Right,Dorsal Foot o Clean wound with Normal Saline. o Dial antibacterial soap, wash wounds, rinse and pat dry prior to dressing wounds o May Shower, gently pat wound dry prior to applying new dressing. Primary Wound Dressing Wound #1 Left Lower Leg o Silver Alginate Wound #2 Left,Proximal Lower Leg o Silver Alginate Wound #6 Right,Dorsal Foot o Silver Collagen Secondary Dressing Wound #1 Left Lower Leg o ABD and Kerlix/Conform Wound #2 Left,Proximal Lower Leg o ABD and Kerlix/Conform Wound #6 Right,Dorsal Foot o ABD and Kerlix/Conform Dressing Change Frequency Wound #1 Left Lower Leg o Change  dressing every other day. YAREN, BARIENTOS ([id number]) Wound #2 Left,Proximal Lower Leg o Change dressing every other day. Wound #6 Right,Dorsal Foot o Change dressing every other day. Follow-up Appointments Wound #1 Left Lower Leg o Return Appointment in 1 week. Wound #2 Left,Proximal Lower Leg o Return Appointment in 1 week. Wound #6 Right,Dorsal Foot o Return Appointment in 1 week. Edema Control Wound #1 Left Lower Leg o Elevate legs to the level of the heart and pump ankles as often as possible o Other: - TubiGrip  F Wound #2 Left,Proximal Lower Leg o Elevate legs to the level of the heart and pump ankles as often as possible o Other: - TubiGrip F Wound #6 Right,Dorsal Foot o Elevate legs to the level of the heart and pump ankles as often as possible o Other: - TubiGrip F Electronic Signature(s) Signed: 06/18/2019 4:45:34 PM By: Montey Hora Signed: 06/20/2019 5:37:52 PM By: Worthy Keeler PA-C Entered By: Montey Hora on 06/18/2019 16:15:59 Bonnie Kane ([id number]) -------------------------------------------------------------------------------- Problem List Details Patient Name: Bonnie Kane Date of Service: 06/18/2019 3:15 PM Medical Record Number: [id number] Patient Account Number: [id number] Date of Birth/Sex: 1946/04/15 (73 y.o. F) Treating RN: Montey Hora Primary Care Provider: Shawnie Dapper Other Clinician: Referring Provider: Shawnie Dapper Treating Provider/Extender: Melburn Hake, Haygen Zebrowski Weeks in Treatment: 3 Active Problems ICD-10 Evaluated Encounter Code Description Active Date Today Diagnosis I87.2 Venous insufficiency (chronic) (peripheral) 05/24/2019 No Yes I89.0 Lymphedema, not elsewhere classified 05/24/2019 No Yes I73.89 Other specified peripheral vascular diseases 05/24/2019 No Yes L97.821 Non-pressure chronic ulcer of other part of left lower leg limited to 05/24/2019 No Yes breakdown of skin L97.521 Non-pressure chronic ulcer of other part of left foot limited to breakdown of 05/24/2019 No Yes skin L97.512 Non-pressure chronic ulcer of other part of right foot with fat layer exposed 06/10/2019 No Yes I50.42 Chronic combined systolic (congestive) and diastolic (congestive) heart 05/24/2019 No Yes failure E11.621 Type 2 diabetes mellitus with foot ulcer 05/24/2019 No Yes Inactive Problems Resolved Problems Electronic Signature(s) Signed: 06/18/2019 3:50:28 PM By: Worthy Keeler PA-C Entered By: Worthy Keeler on 06/18/2019 15:50:28 Bonnie Kane  ([id number]) -------------------------------------------------------------------------------- Progress Note Details Patient Name: Bonnie Kane Date of Service: 06/18/2019 3:15 PM Medical Record Number: [id number] Patient Account Number: [id number] Date of Birth/Sex: March 29, 1946 (73 y.o. F) Treating RN: Montey Hora Primary Care Provider: Shawnie Dapper Other Clinician: Referring Provider: Shawnie Dapper Treating Provider/Extender: Melburn Hake, Etna Forquer Weeks in Treatment: 3 Subjective Chief Complaint Information obtained from Patient Left LE Blisters and Edema History of Present Illness (HPI) 05/24/19 patient presents today for initial evaluation here in our clinic as a referral from Dr. Julien Nordmann who is her family medicine practitioner. He has been taking care of this patient for several weeks now it appears. At least since it appears December 2020 although she has had some issue for quite some time. She has seen the vascular surgeon at [place] and did have ABIs and TBI's which were performed back at that point. That was in September 2020. With that being said unfortunately she did not show signs of low ABIs and TBI's on the left which is where the blistering is taking place at this point her right seem to be in good shape as far as the results were concerned. She had a right ABI of 1.17 with a TBI of 0.85 and a left ABI of 0.68 with a TBI of 0.43. This is at least moderate disease. The patient does have diabetes although apparently this is  diet controlled according to what they tell me at this point. She does also have congestive heart failure, bilateral lower extremity edema with a history of chronic venous stasis, systolic heart murmur, hypertension, and appears to have also had a transcatheter aortic valve replacement. Was on April 2018. The patient has been on doxycycline initially by Dr. Ernestine Conrad and then subsequently this was switched just yesterday to Cipro. He does  note in his note yesterday that she had improved but he still recommended that she come to the wound care center for further evaluation and treatment. He did also on top given her the Cipro to cover the Serratia give her Vicodin for helping with the pain he also performed a basic metabolic panel and a CBC yesterday as well. He does have a history of having been recommended compression stockings in the past although apparently this ripped skin off when they remove them according to the patient and her husband. He is present during the evaluation today as well. Is also do CBC which actually appear to be doing fairly well the white blood cell count was 7.0 and appears normal. The metabolic panel did show an elevation in the BUN and creatinine as well as the ratio at this point her glucose was 231 not be fasting but still that is an elevation range. Right now the patient is taking 40 mg of Lasix in the morning and 20 mg in the evening. He is also get a recheck the metabolic panel in a week. He notes that her kidney function was just a little bit down compared to her baseline. His most recent hemoglobin A1c was 7.2 on October 25, 2018. That was found in care everywhere from [place]. Patient's wound culture was actually performed just 4 days ago and again did show Serratia as the causative organism for the infection currently and obviously doxycycline is not to work for this that is why she was switched to Cipro. 05/31/2019 upon evaluation today patient appears to be doing well at this time with regard to her wounds on the left lower extremity. Several of the blisters that she did have have also significantly improved. These have ruptured but do not appear to be spreading and a lot of the tissue though I think will come off I do not think we have to rush as far as this is concerned she is having a lot of discomfort still that really is disproportionate even to her wounds which seem to be  improving. She did get the Farrow wrap although her husband states that she is not good to be able to wear the sock portion that goes underneath as it has something covering her toes she apparently is unable to wear anything that covers her toes. He states he is been married for close to 50 years and is never seen her where anything that even resembles a sock. 06/10/2019 upon evaluation today patient appears to be doing well with regard to her wounds at this point. She is still experiencing significant pain currently that I do believe is more neuropathic in nature I discussed this previous. Her wounds are healing quite nicely and to be honest her pain is disproportionate to the open wounds that she actually has present at this point she also does not appear to be showing any signs of infection. She does have numbness in her toes and then subsequently with regard to her wounds and even some areas that are not even open she has significant pain  which I would describe is more neuropathic/nerve in nature. This was discussed with her and her husband yet again today. I did explain again today that I do not believe simply performing venous ablation is good to take care of this pain either. I did discuss possible options including since she has had issues in the past with other medications possibly trying Cymbalta her twin brother actually has a similar issue with neuropathy and neuropathic pain and takes Cymbalta with good result. I think that is at least a possibility for her to be honest. She could talk with her primary care provider about it we do not prescribe that medication at the wound center just due to the nature of follow-up this necessary for that medicine. 06/18/2019 upon evaluation today patient appears to be doing well with regard to her wounds in general. I am extremely pleased with the progress that she is made and in fact her wounds seem to be healing quite nicely with compression. With that  being said I do believe that she is going need something ongoing as far as compression is concerned. If she does not have ongoing compression then she will obviously revert back to where things were in the end. Nonetheless I do think at minimum she may need to have Tubigrip that she wears ongoing. I would discuss this in greater detail with her next week. For the time being we are more focused on trying to get her wounds healed and again she is definitely doing much better in that regard. Unfortunately I am told that when they change this that she has excruciating pain for 8 hours I really not sure why she will be having this pain obviously there is no reason that the silver alginate really should be causing pain like this and again her pain again appears to be disproportionate to the wounds at this point especially considering there healing so well. For that reason I really think that this may be Morbid just changing the dressings in general. Stiverson, Jawana ([id number]) Objective Constitutional Obese and well-hydrated in no acute distress. Vitals Time Taken: 3:45 PM, Height: 64 in, Weight: 250 lbs, BMI: 42.9, Temperature: 97.6 F, Pulse: 79 bpm, Respiratory Rate: 18 breaths/min, Blood Pressure: 146/73 mmHg. Respiratory normal breathing without difficulty. Psychiatric this patient is able to make decisions and demonstrates good insight into disease process. Alert and Oriented x 3. pleasant and cooperative. General Notes: Upon inspection patient's wound actually appears to be doing quite well at all locations. In fact she is healed in some locations and seems to be doing excellent. Other areas where she has not healed still have improved and overall I am extremely pleased in this regard. I think she is made great progress in just a few weeks that she has been here up to this point. Nonetheless I do think she is going to require ongoing compression of one type or another. Integumentary (Hair,  Skin) Wound #1 status is Open. Original cause of wound was Blister. The wound is located on the Left Lower Leg. The wound measures 2cm length x 3.5cm width x 0.1cm depth; 5.498cm^2 area and 0.55cm^3 volume. There is Fat Layer (Subcutaneous Tissue) Exposed exposed. There is no tunneling or undermining noted. There is a medium amount of serous drainage noted. The wound margin is flat and intact. There is small (1-33%) granulation within the wound bed. There is a medium (34-66%) amount of necrotic tissue within the wound bed including Adherent Slough. Wound #2 status is Open. Original cause  of wound was Blister. The wound is located on the Left,Proximal Lower Leg. The wound measures 1.5cm length x 1.7cm width x 0.1cm depth; 2.003cm^2 area and 0.2cm^3 volume. There is Fat Layer (Subcutaneous Tissue) Exposed exposed. There is a medium amount of serous drainage noted. The wound margin is flat and intact. There is medium (34-66%) pink granulation within the wound bed. There is a medium (34-66%) amount of necrotic tissue within the wound bed including Adherent Slough. Wound #3 status is Healed - Epithelialized. Original cause of wound was Blister. The wound is located on the Left,Dorsal Foot. The wound measures 0cm length x 0cm width x 0cm depth; 0cm^2 area and 0cm^3 volume. There is no tunneling or undermining noted. There is a none present amount of drainage noted. The wound margin is flat and intact. There is no granulation within the wound bed. There is no necrotic tissue within the wound bed. Wound #6 status is Open. Original cause of wound was Gradually Appeared. The wound is located on the Right,Dorsal Foot. The wound measures 0.3cm length x 0.3cm width x 0.2cm depth; 0.071cm^2 area and 0.014cm^3 volume. There is Fat Layer (Subcutaneous Tissue) Exposed exposed. There is no tunneling or undermining noted. There is a medium amount of drainage noted. The wound margin is flat and intact. There is small  (1-33%) pink granulation within the wound bed. There is a large (67-100%) amount of necrotic tissue within the wound bed including Adherent Slough. Assessment Active Problems ICD-10 Venous insufficiency (chronic) (peripheral) Lymphedema, not elsewhere classified Other specified peripheral vascular diseases Non-pressure chronic ulcer of other part of left lower leg limited to breakdown of skin Non-pressure chronic ulcer of other part of left foot limited to breakdown of skin Non-pressure chronic ulcer of other part of right foot with fat layer exposed Chronic combined systolic (congestive) and diastolic (congestive) heart failure Type 2 diabetes mellitus with foot ulcer Plan Wound Cleansing: Wound #1 Left Lower Leg: Clean wound with Normal Saline. RAI, REPASKY ([id number]) Dial antibacterial soap, wash wounds, rinse and pat dry prior to dressing wounds May Shower, gently pat wound dry prior to applying new dressing. Wound #2 Left,Proximal Lower Leg: Clean wound with Normal Saline. Dial antibacterial soap, wash wounds, rinse and pat dry prior to dressing wounds May Shower, gently pat wound dry prior to applying new dressing. Wound #6 Right,Dorsal Foot: Clean wound with Normal Saline. Dial antibacterial soap, wash wounds, rinse and pat dry prior to dressing wounds May Shower, gently pat wound dry prior to applying new dressing. Primary Wound Dressing: Wound #1 Left Lower Leg: Silver Alginate Wound #2 Left,Proximal Lower Leg: Silver Alginate Wound #6 Right,Dorsal Foot: Silver Collagen Secondary Dressing: Wound #1 Left Lower Leg: ABD and Kerlix/Conform Wound #2 Left,Proximal Lower Leg: ABD and Kerlix/Conform Wound #6 Right,Dorsal Foot: ABD and Kerlix/Conform Dressing Change Frequency: Wound #1 Left Lower Leg: Change dressing every other day. Wound #2 Left,Proximal Lower Leg: Change dressing every other day. Wound #6 Right,Dorsal Foot: Change dressing every other  day. Follow-up Appointments: Wound #1 Left Lower Leg: Return Appointment in 1 week. Wound #2 Left,Proximal Lower Leg: Return Appointment in 1 week. Wound #6 Right,Dorsal Foot: Return Appointment in 1 week. Edema Control: Wound #1 Left Lower Leg: Elevate legs to the level of the heart and pump ankles as often as possible Other: - TubiGrip F Wound #2 Left,Proximal Lower Leg: Elevate legs to the level of the heart and pump ankles as often as possible Other: - TubiGrip F Wound #6 Right,Dorsal Foot: Elevate legs to  the level of the heart and pump ankles as often as possible Other: - TubiGrip F 1. I would recommend currently we continue the silver alginate as that seems to be doing well I am afraid the plain alginate would stick to her wounds which will cause her more discomfort try to clean this away. With that being said we could always try switching to the plain alginate but again the silver cell is going well. 2. I am in a suggest as well that we continue with the Tubigrip which I think is helpful for her. Obviously a compression wrap would probably be better she still has a lot of edema but this is better than nothing which is about all we are able to do right now. We will see patient back for reevaluation in 1 week here in the clinic. If anything worsens or changes patient will contact our office for additional recommendations. Electronic Signature(s) Signed: 06/18/2019 5:08:46 PM By: Worthy Keeler PA-C Entered By: Worthy Keeler on 06/18/2019 17:08:45 Bonnie Kane ([id number]) -------------------------------------------------------------------------------- SuperBill Details Patient Name: Bonnie Kane Date of Service: 06/18/2019 Medical Record Number: [id number] Patient Account Number: [id number] Date of Birth/Sex: May 20, 1945 (73 y.o. 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-pressure chronic ulcer of other part of right foot with fat layer exposed I50.42 Chronic combined systolic (congestive) and diastolic (congestive) heart failure E11.621 Type 2 diabetes mellitus with foot ulcer Facility Procedures CPT4 Code: [id number] Description: [id number] - WOUND CARE VISIT-LEV 5 EST PT Modifier: Quantity: 1 Physician Procedures CPT4 Code: [id number] Description: [id number] - WC PHYS LEVEL 3 - EST PT Modifier: Quantity: 1 CPT4 Code: Description: ICD-10 Diagnosis Description I87.2 Venous insufficiency (chronic) (peripheral) I89.0 Lymphedema, not elsewhere classified I73.89 Other specified peripheral vascular diseases L97.821 Non-pressure chronic ulcer of other part of left lower leg  limited to breakd Modifier: own of skin Quantity: Electronic Signature(s) Signed: 06/19/2019 10:54:09 AM By: Sharon Mt Signed: 06/20/2019 5:37:52 PM By: Worthy Keeler PA-C Previous Signature: 06/18/2019 5:09:23 PM Version By: Worthy Keeler PA-C Entered By: Sharon Mt on 06/19/2019 10:54:09

## 2019-06-18 NOTE — Progress Notes (Signed)
Bonnie Kane ([id number]) Visit Report for 06/18/2019 Arrival Information Details Patient Name: Bonnie Kane, Bonnie Kane Date of Service: 06/18/2019 3:15 PM Medical Record Number: [id number] Patient Account Number: [id number] Date of Birth/Sex: [birth year] (74 y.o. F) Treating RN: Montey Hora Primary Care Bonnie Kane: Shawnie Dapper Other Clinician: Referring Bonnie Kane: Shawnie Dapper Treating Laderrick Wilk/Extender: Melburn Hake, Bonnie Kane in Treatment: 3 Visit Information History Since Last Visit Added or deleted any medications: No Patient Arrived: Cane Any new allergies or adverse reactions: No Arrival Time: 15:47 Had a fall or experienced change in No Accompanied By: husband activities of daily living that may affect Transfer Assistance: None risk of falls: Patient Identification Verified: Yes Signs or symptoms of abuse/neglect since last visito No Secondary Verification Process Completed: Yes Hospitalized since last visit: No Implantable device outside of the clinic excluding No cellular tissue based products placed in the center since last visit: Has Dressing in Place as Prescribed: Yes Pain Present Now: No Electronic Signature(s) Signed: 06/18/2019 4:01:06 PM By: Lorine Bears RCP, RRT, CHT Entered By: Lorine Bears on 06/18/2019 15:48:58 Bonnie Kane ([id number]) -------------------------------------------------------------------------------- Clinic Level of Care Assessment Details Patient Name: Bonnie Kane Date of Service: 06/18/2019 3:15 PM Medical Record Number: [id number] Patient Account Number: [id number] Date of Birth/Sex: [birth year] (74 y.o. F) Treating RN: Montey Hora Primary Care Shawneequa Baldridge: Shawnie Dapper Other Clinician: Referring Bonnie Kane: Shawnie Dapper Treating Bonnie Kane/Extender: Melburn Hake, Bonnie Kane in Treatment: 3 Clinic Level of Care Assessment Items TOOL 4 Quantity Score []  - Use when only an EandM is performed on FOLLOW-UP visit  0 ASSESSMENTS - Nursing Assessment / Reassessment X - Reassessment of Co-morbidities (includes updates in patient status) 1 10 X- 1 5 Reassessment of Adherence to Treatment Plan ASSESSMENTS - Wound and Skin Assessment / Reassessment []  - Simple Wound Assessment / Reassessment - one wound 0 X- 4 5 Complex Wound Assessment / Reassessment - multiple wounds []  - 0 Dermatologic / Skin Assessment (not related to wound area) ASSESSMENTS - Focused Assessment []  - Circumferential Edema Measurements - multi extremities 0 []  - 0 Nutritional Assessment / Counseling / Intervention X- 1 5 Lower Extremity Assessment (monofilament, tuning fork, pulses) []  - 0 Peripheral Arterial Disease Assessment (using hand held doppler) ASSESSMENTS - Ostomy and/or Continence Assessment and Care []  - Incontinence Assessment and Management 0 []  - 0 Ostomy Care Assessment and Management (repouching, etc.) PROCESS - Coordination of Care X - Simple Patient / Family Education for ongoing care 1 15 []  - 0 Complex (extensive) Patient / Family Education for ongoing care X- 1 10 Staff obtains Programmer, systems, Records, Test Results / Process Orders []  - 0 Staff telephones HHA, Nursing Homes / Clarify orders / etc []  - 0 Routine Transfer to another Facility (non-emergent condition) []  - 0 Routine Hospital Admission (non-emergent condition) []  - 0 New Admissions / Biomedical engineer / Ordering NPWT, Apligraf, etc. []  - 0 Emergency Hospital Admission (emergent condition) X- 1 10 Simple Discharge Coordination []  - 0 Complex (extensive) Discharge Coordination PROCESS - Special Needs []  - Pediatric / Minor Patient Management 0 []  - 0 Isolation Patient Management []  - 0 Hearing / Language / Visual special needs []  - 0 Assessment of Community assistance (transportation, D/C planning, etc.) [name], [name] ([id number]) []  - 0 Additional assistance / Altered mentation []  - 0 Support Surface(s) Assessment (bed,  cushion, seat, etc.) INTERVENTIONS - Wound Cleansing / Measurement []  - Simple Wound Cleansing - one wound 0 X- 4 5 Complex Wound Cleansing - multiple wounds X- 1 5 Wound Imaging (photographs -  any number of wounds) []  - 0 Wound Tracing (instead of photographs) []  - 0 Simple Wound Measurement - one wound X- 4 5 Complex Wound Measurement - multiple wounds INTERVENTIONS - Wound Dressings []  - Small Wound Dressing one or multiple wounds 0 []  - 0 Medium Wound Dressing one or multiple wounds X- 2 20 Large Wound Dressing one or multiple wounds []  - 0 Application of Medications - topical []  - 0 Application of Medications - injection INTERVENTIONS - Miscellaneous []  - External ear exam 0 []  - 0 Specimen Collection (cultures, biopsies, blood, body fluids, etc.) []  - 0 Specimen(s) / Culture(s) sent or taken to Lab for analysis []  - 0 Patient Transfer (multiple staff / Civil Service fast streamer / Similar devices) []  - 0 Simple Staple / Suture removal (25 or less) []  - 0 Complex Staple / Suture removal (26 or more) []  - 0 Hypo / Hyperglycemic Management (close monitor of Blood Glucose) []  - 0 Ankle / Brachial Index (ABI) - do not check if billed separately X- 1 5 Vital Signs Has the patient been seen at the hospital within the last three years: Yes Total Score: 165 Level Of Care: New/Established - Level 5 Electronic Signature(s) Signed: 06/18/2019 4:45:34 PM By: Montey Hora Entered By: Montey Hora on 06/18/2019 16:16:39 Bonnie Kane ([id number]) -------------------------------------------------------------------------------- Encounter Discharge Information Details Patient Name: Bonnie Kane Date of Service: 06/18/2019 3:15 PM Medical Record Number: [id number] Patient Account Number: [id number] Date of Birth/Sex: 1945/05/14 (74 y.o. F) Treating RN: Montey Hora Primary Care Woodie Degraffenreid: Shawnie Dapper Other Clinician: Referring Tallon Gertz: Shawnie Dapper Treating Ligia Duguay/Extender: Melburn Hake, Bonnie Kane in Treatment: 3 Encounter Discharge Information Items Discharge Condition: Stable Ambulatory Status: Cane Discharge Destination: Home Transportation: Private Auto Accompanied By: husband Schedule Follow-up Appointment: Yes Clinical Summary of Care: Electronic Signature(s) Signed: 06/18/2019 4:45:34 PM By: Montey Hora Entered By: Montey Hora on 06/18/2019 16:17:37 Bonnie Kane ([id number]) -------------------------------------------------------------------------------- Lower Extremity Assessment Details Patient Name: Bonnie Kane Date of Service: 06/18/2019 3:15 PM Medical Record Number: [id number] Patient Account Number: [id number] Date of Birth/Sex: [birth year] (73 y.o. F) Treating RN: Army Melia Primary Care Kemya Shed: Shawnie Dapper Other Clinician: Referring Ildefonso Keaney: Shawnie Dapper Treating Denzil Mceachron/Extender: STONE III, Bonnie Kane in Treatment: 3 Edema Assessment Assessed: [Left: No] [Right: No] Edema: [Left: Yes] [Right: Yes] Calf Left: Right: Point of Measurement: 29 cm From Medial Instep 48 cm 49 cm Ankle Left: Right: Point of Measurement: 10 cm From Medial Instep 27 cm 28 cm Vascular Assessment Pulses: Dorsalis Pedis Palpable: [Right:Yes] Electronic Signature(s) Signed: 06/18/2019 4:27:58 PM By: Army Melia Entered By: Army Melia on 06/18/2019 15:56:03 Bonnie Kane ([id number]) -------------------------------------------------------------------------------- Multi Wound Chart Details Patient Name: Bonnie Kane Date of Service: 06/18/2019 3:15 PM Medical Record Number: [id number] Patient Account Number: [id number] Date of Birth/Sex: May 24, 1945 (74 y.o. F) Treating RN: Montey Hora Primary Care Idolina Mantell: Shawnie Dapper Other Clinician: Referring Alexavier Tsutsui: Shawnie Dapper Treating Xandra Laramee/Extender: STONE III, Bonnie Kane in Treatment: 3 Vital Signs Height(in): 64 Pulse(bpm): 90 Weight(lbs): 250 Blood Pressure(mmHg): 146/73 Body Mass  Index(BMI): 43 Temperature(F): 97.6 Respiratory Rate(breaths/min): 18 Photos: Wound Location: Left Lower Leg Left, Proximal Lower Leg Left, Dorsal Foot Wounding Event: Blister Blister Blister Primary Etiology: Trauma, Other Trauma, Other Venous Leg Ulcer Comorbid History: Type II Diabetes, Osteoarthritis, Type II Diabetes, Osteoarthritis, Type II Diabetes, Osteoarthritis, Neuropathy, Received Neuropathy, Received Neuropathy, Received Chemotherapy, Received Radiation Chemotherapy, Received Radiation Chemotherapy, Received Radiation Date Acquired: 05/01/2019 05/01/2019 05/01/2019 Kane of Treatment: 3 3 3  Wound Status: Open Open Healed - Epithelialized Measurements L x W x  D (cm) 2x3.5x0.1 1.5x1.7x0.1 0x0x0 Area (cm) : 5.498 2.003 0 Volume (cm) : 0.55 0.2 0 % Reduction in Area: 68.20% -6.30% 100.00% % Reduction in Volume: 68.20% -6.40% 100.00% Classification: Full Thickness Without Exposed Full Thickness Without Exposed Full Thickness Without Exposed Support Structures Support Structures Support Structures Exudate Amount: Medium Medium None Present Exudate Type: Serous Serous N/A Exudate Color: amber amber N/A Wound Margin: Flat and Intact Flat and Intact Flat and Intact Granulation Amount: Small (1-33%) Medium (34-66%) None Present (0%) Granulation Quality: N/A Pink N/A Necrotic Amount: Medium (34-66%) Medium (34-66%) None Present (0%) Exposed Structures: Fat Layer (Subcutaneous Tissue) Fat Layer (Subcutaneous Tissue) Fascia: No Exposed: Yes Exposed: Yes Fat Layer (Subcutaneous Tissue) Fascia: No Fascia: No Exposed: No Tendon: No Tendon: No Tendon: No Muscle: No Muscle: No Muscle: No Joint: No Joint: No Joint: No Bone: No Bone: No Bone: No Epithelialization: None None Large (67-100%) Wound Number: 6 N/A N/A Photos: N/A N/A Bonnie Kane, Bonnie Kane ([id number]) Wound Location: Right, Dorsal Foot N/A N/A Wounding Event: Gradually Appeared N/A N/A Primary Etiology: Diabetic  Wound/Ulcer of the Lower N/A N/A Extremity Comorbid History: Type II Diabetes, Osteoarthritis, N/A N/A Neuropathy, Received Chemotherapy, Received Radiation Date Acquired: 06/03/2019 N/A N/A Kane of Treatment: 1 N/A N/A Wound Status: Open N/A N/A Measurements L x W x D (cm) 0.3x0.3x0.2 N/A N/A Area (cm) : 0.071 N/A N/A Volume (cm) : 0.014 N/A N/A % Reduction in Area: 54.80% N/A N/A % Reduction in Volume: 12.50% N/A N/A Classification: Grade 1 N/A N/A Exudate Amount: Medium N/A N/A Exudate Type: N/A N/A N/A Exudate Color: N/A N/A N/A Wound Margin: Flat and Intact N/A N/A Granulation Amount: Small (1-33%) N/A N/A Granulation Quality: Pink N/A N/A Necrotic Amount: Large (67-100%) N/A N/A Exposed Structures: Fat Layer (Subcutaneous Tissue) N/A N/A Exposed: Yes Fascia: No Tendon: No Muscle: No Joint: No Bone: No Epithelialization: None N/A N/A Treatment Notes Electronic Signature(s) Signed: 06/18/2019 4:45:34 PM By: Montey Hora Entered By: Montey Hora on 06/18/2019 16:13:19 Bonnie Kane ([id number]) -------------------------------------------------------------------------------- Multi-Disciplinary Care Plan Details Patient Name: Bonnie Kane Date of Service: 06/18/2019 3:15 PM Medical Record Number: [id number] Patient Account Number: [id number] Date of Birth/Sex: 1945-07-03 (73 y.o. F) Treating RN: Montey Hora Primary Care Dvon Jiles: Shawnie Dapper Other Clinician: Referring Ryota Treece: Shawnie Dapper Treating Felita Bump/Extender: Melburn Hake, Bonnie Kane in Treatment: 3 Active Inactive Abuse / Safety / Falls / Self Care Management Nursing Diagnoses: Potential for falls Goals: Patient will remain injury free related to falls Date Initiated: 05/24/2019 Target Resolution Date: 08/24/2019 Goal Status: Active Interventions: Assess fall risk on admission and as needed Notes: Orientation to the Wound Care Program Nursing Diagnoses: Knowledge deficit related to the wound  healing center program Goals: Patient/caregiver will verbalize understanding of the [facility] Date Initiated: 05/24/2019 Target Resolution Date: 08/24/2019 Goal Status: Active Interventions: Provide education on orientation to the wound center Notes: Pain, Acute or Chronic Nursing Diagnoses: Pain, acute or chronic: actual or potential Goals: Patient/caregiver will verbalize adequate pain control between visits Date Initiated: 05/24/2019 Target Resolution Date: 08/24/2019 Goal Status: Active Interventions: Complete pain assessment as per visit requirements Notes: Venous Leg Ulcer Nursing Diagnoses: Potential for venous Insuffiency (use before diagnosis confirmed) Bonnie Kane, Bonnie Kane ([id number]) Goals: Patient will maintain optimal edema control Date Initiated: 05/24/2019 Target Resolution Date: 08/24/2019 Goal Status: Active Interventions: Assess peripheral edema status every visit. Notes: Wound/Skin Impairment Nursing Diagnoses: Impaired tissue integrity Goals: Ulcer/skin breakdown will heal within 14 Kane Date Initiated: 05/24/2019 Target Resolution Date: 08/24/2019 Goal Status: Active Interventions:  Assess patient/caregiver ability to obtain necessary supplies Assess patient/caregiver ability to perform ulcer/skin care regimen upon admission and as needed Assess ulceration(s) every visit Notes: Electronic Signature(s) Signed: 06/18/2019 4:45:34 PM By: Montey Hora Entered By: Montey Hora on 06/18/2019 16:10:01 Bonnie Kane ([id number]) -------------------------------------------------------------------------------- Pain Assessment Details Patient Name: Bonnie Kane Date of Service: 06/18/2019 3:15 PM Medical Record Number: [id number] Patient Account Number: [id number] Date of Birth/Sex: 03-19-1946 (73 y.o. F) Treating RN: Army Melia Primary Care Christinna Sprung: Shawnie Dapper Other Clinician: Referring Lunna Vogelgesang: Shawnie Dapper Treating Jatia Musa/Extender: STONE III,  Bonnie Kane in Treatment: 3 Active Problems Location of Pain Severity and Description of Pain Patient Has Paino Yes Site Locations Pain Location: Pain in Ulcers Rate the pain. Current Pain Level: 3 Pain Management and Medication Current Pain Management: Electronic Signature(s) Signed: 06/18/2019 4:27:58 PM By: Army Melia Entered By: Army Melia on 06/18/2019 15:50:32 Bonnie Kane ([id number]) -------------------------------------------------------------------------------- Patient/Caregiver Education Details Patient Name: Bonnie Kane Date of Service: 06/18/2019 3:15 PM Medical Record Number: [id number] Patient Account Number: [id number] Date of Birth/Gender: May 17, 1945 (73 y.o. F) Treating RN: Montey Hora Primary Care Physician: Shawnie Dapper Other Clinician: Referring Physician: Shawnie Dapper Treating Physician/Extender: Sharalyn Ink in Treatment: 3 Education Assessment Education Provided To: Patient and Caregiver Education Topics Provided Wound/Skin Impairment: Handouts: Other: wound care as ordered Methods: Demonstration, Explain/Verbal Responses: State content correctly Electronic Signature(s) Signed: 06/18/2019 4:45:34 PM By: Montey Hora Entered By: Montey Hora on 06/18/2019 16:16:58 Bonnie Kane ([id number]) -------------------------------------------------------------------------------- Wound Assessment Details Patient Name: Bonnie Kane Date of Service: 06/18/2019 3:15 PM Medical Record Number: [id number] Patient Account Number: [id number] Date of Birth/Sex: 1946-01-21 (73 y.o. F) Treating RN: Montey Hora Primary Care Criss Pallone: Shawnie Dapper Other Clinician: Referring Crystol Walpole: Shawnie Dapper Treating Britzy Graul/Extender: STONE III, Bonnie Kane in Treatment: 3 Wound Status Wound Number: 1 Primary Trauma, Other Etiology: Wound Location: Left Lower Leg Wound Open Wounding Event: Blister Status: Date Acquired: 05/01/2019 Comorbid Type II  Diabetes, Osteoarthritis, Neuropathy, Received Kane Of Treatment: 3 History: Chemotherapy, Received Radiation Clustered Wound: No Photos Wound Measurements Length: (cm) 2 Width: (cm) 3.5 Depth: (cm) 0.1 Area: (cm) 5.498 Volume: (cm) 0.55 % Reduction in Area: 68.2% % Reduction in Volume: 68.2% Epithelialization: None Tunneling: No Undermining: No Wound Description Classification: Full Thickness Without Exposed Support Structures Wound Margin: Flat and Intact Exudate Amount: Medium Exudate Type: Serous Exudate Color: amber Foul Odor After Cleansing: No Slough/Fibrino Yes Wound Bed Granulation Amount: Small (1-33%) Exposed Structure Necrotic Amount: Medium (34-66%) Fascia Exposed: No Necrotic Quality: Adherent Slough Fat Layer (Subcutaneous Tissue) Exposed: Yes Tendon Exposed: No Muscle Exposed: No Joint Exposed: No Bone Exposed: No Treatment Notes Wound #1 (Left Lower Leg) Notes silver ag on left leg wounds, prisma to right dorsal foot, ABD, conform, tubi F Electronic Signature(s) Signed: 06/18/2019 4:45:34 PM By: Gomez Cleverly ([id number]) Entered By: Montey Hora on 06/18/2019 16:10:55 Bonnie Kane ([id number]) -------------------------------------------------------------------------------- Wound Assessment Details Patient Name: Bonnie Kane Date of Service: 06/18/2019 3:15 PM Medical Record Number: [id number] Patient Account Number: [id number] Date of Birth/Sex: 01/16/1946 (73 y.o. F) Treating RN: Montey Hora Primary Care Orren Pietsch: Shawnie Dapper Other Clinician: Referring Louise Victory: Shawnie Dapper Treating Tane Biegler/Extender: STONE III, Bonnie Kane in Treatment: 3 Wound Status Wound Number: 2 Primary Trauma, Other Etiology: Wound Location: Left, Proximal Lower Leg Wound Open Wounding Event: Blister Status: Date Acquired: 05/01/2019 Comorbid Type II Diabetes, Osteoarthritis, Neuropathy, Received Kane Of Treatment: 3 History: Chemotherapy,  Received Radiation Clustered Wound: No Photos Wound Measurements Length: (cm) 1.5 Width: (cm) 1.7 Depth: (cm) 0.1 Area: (  cm) 2.003 Volume: (cm) 0.2 % Reduction in Area: -6.3% % Reduction in Volume: -6.4% Epithelialization: None Wound Description Classification: Full Thickness Without Exposed Support Structures Wound Margin: Flat and Intact Exudate Amount: Medium Exudate Type: Serous Exudate Color: amber Foul Odor After Cleansing: No Slough/Fibrino Yes Wound Bed Granulation Amount: Medium (34-66%) Exposed Structure Granulation Quality: Pink Fascia Exposed: No Necrotic Amount: Medium (34-66%) Fat Layer (Subcutaneous Tissue) Exposed: Yes Necrotic Quality: Adherent Slough Tendon Exposed: No Muscle Exposed: No Joint Exposed: No Bone Exposed: No Treatment Notes Wound #2 (Left, Proximal Lower Leg) Notes silver ag on left leg wounds, prisma to right dorsal foot, ABD, conform, tubi F Electronic Signature(s) Signed: 06/18/2019 4:45:34 PM By: Gomez Cleverly ([id number]) Entered By: Montey Hora on 06/18/2019 16:10:56 Bonnie Kane ([id number]) -------------------------------------------------------------------------------- Wound Assessment Details Patient Name: Bonnie Kane Date of Service: 06/18/2019 3:15 PM Medical Record Number: [id number] Patient Account Number: [id number] Date of Birth/Sex: 1945/06/17 (73 y.o. F) Treating RN: Montey Hora Primary Care Jamecia Lerman: Shawnie Dapper Other Clinician: Referring Alvina Strother: Shawnie Dapper Treating Larita Deremer/Extender: STONE III, Bonnie Kane in Treatment: 3 Wound Status Wound Number: 3 Primary Venous Leg Ulcer Etiology: Wound Location: Left, Dorsal Foot Wound Healed - Epithelialized Wounding Event: Blister Status: Date Acquired: 05/01/2019 Comorbid Type II Diabetes, Osteoarthritis, Neuropathy, Received Kane Of Treatment: 3 History: Chemotherapy, Received Radiation Clustered Wound: No Photos Wound  Measurements Length: (cm) Width: (cm) Depth: (cm) Area: (cm) Volume: (cm) 0 % Reduction in Area: 100% 0 % Reduction in Volume: 100% 0 Epithelialization: Large (67-100%) 0 Tunneling: No 0 Undermining: No Wound Description Classification: Full Thickness Without Exposed Support Structures Wound Margin: Flat and Intact Exudate Amount: None Present Foul Odor After Cleansing: No Slough/Fibrino Yes Wound Bed Granulation Amount: None Present (0%) Exposed Structure Necrotic Amount: None Present (0%) Fascia Exposed: No Fat Layer (Subcutaneous Tissue) Exposed: No Tendon Exposed: No Muscle Exposed: No Joint Exposed: No Bone Exposed: No Electronic Signature(s) Signed: 06/18/2019 4:45:34 PM By: Montey Hora Entered By: Montey Hora on 06/18/2019 16:11:00 Bonnie Kane ([id number]) -------------------------------------------------------------------------------- Wound Assessment Details Patient Name: Bonnie Kane Date of Service: 06/18/2019 3:15 PM Medical Record Number: [id number] Patient Account Number: [id number] Date of Birth/Sex: 1945-08-10 (74 y.o. F) Treating RN: Montey Hora Primary Care Imya Mance: Shawnie Dapper Other Clinician: Referring Zidan Helget: Shawnie Dapper Treating Sahily Biddle/Extender: STONE III, Bonnie Kane in Treatment: 3 Wound Status Wound Number: 6 Primary Diabetic Wound/Ulcer of the Lower Extremity Etiology: Wound Location: Right, Dorsal Foot Wound Open Wounding Event: Gradually Appeared Status: Date Acquired: 06/03/2019 Comorbid Type II Diabetes, Osteoarthritis, Neuropathy, Received Kane Of Treatment: 1 History: Chemotherapy, Received Radiation Clustered Wound: No Photos Wound Measurements Length: (cm) 0.3 Width: (cm) 0.3 Depth: (cm) 0.2 Area: (cm) 0.071 Volume: (cm) 0.014 % Reduction in Area: 54.8% % Reduction in Volume: 12.5% Epithelialization: None Tunneling: No Undermining: No Wound Description Classification: Grade 1 Wound Margin: Flat and  Intact Exudate Amount: Medium Foul Odor After Cleansing: No Slough/Fibrino Yes Wound Bed Granulation Amount: Small (1-33%) Exposed Structure Granulation Quality: Pink Fascia Exposed: No Necrotic Amount: Large (67-100%) Fat Layer (Subcutaneous Tissue) Exposed: Yes Necrotic Quality: Adherent Slough Tendon Exposed: No Muscle Exposed: No Joint Exposed: No Bone Exposed: No Treatment Notes Wound #6 (Right, Dorsal Foot) Notes silver ag on left leg wounds, prisma to right dorsal foot, ABD, conform, tubi F Electronic Signature(s) Signed: 06/18/2019 4:45:34 PM By: Montey Hora Entered By: Montey Hora on 06/18/2019 16:11:02 Bonnie Kane ([id number]) Bonnie Kane ([id number]) -------------------------------------------------------------------------------- Vitals Details Patient Name: Bonnie Kane Date of Service: 06/18/2019 3:15 PM Medical Record Number:  [id number] Patient Account Number: [id number] Date of Birth/Sex: [birth year] (74 y.o. F) Treating RN: Montey Hora Primary Care Devota Viruet: Shawnie Dapper Other Clinician: Referring Keygan Dumond: Shawnie Dapper Treating Kalon Erhardt/Extender: STONE III, Bonnie Kane in Treatment: 3 Vital Signs Time Taken: 15:45 Temperature (F): 97.6 Height (in): 64 Pulse (bpm): 79 Weight (lbs): 250 Respiratory Rate (breaths/min): 18 Body Mass Index (BMI): 42.9 Blood Pressure (mmHg): 146/73 Reference Range: 80 - 120 mg / dl Electronic Signature(s) Signed: 06/18/2019 4:01:06 PM By: Lorine Bears RCP, RRT, CHT Entered By: Lorine Bears on 06/18/2019 15:49:47

## 2019-06-24 ENCOUNTER — Other Ambulatory Visit: Payer: Self-pay

## 2019-06-24 ENCOUNTER — Encounter: Payer: Medicare Other | Admitting: Physician Assistant

## 2019-06-24 DIAGNOSIS — L97512 Non-pressure chronic ulcer of other part of right foot with fat layer exposed: Secondary | ICD-10-CM | POA: Diagnosis not present

## 2019-06-24 DIAGNOSIS — E11621 Type 2 diabetes mellitus with foot ulcer: Secondary | ICD-10-CM | POA: Diagnosis not present

## 2019-06-24 DIAGNOSIS — I110 Hypertensive heart disease with heart failure: Secondary | ICD-10-CM | POA: Diagnosis not present

## 2019-06-24 DIAGNOSIS — S81802A Unspecified open wound, left lower leg, initial encounter: Secondary | ICD-10-CM | POA: Diagnosis not present

## 2019-06-24 DIAGNOSIS — L97821 Non-pressure chronic ulcer of other part of left lower leg limited to breakdown of skin: Secondary | ICD-10-CM | POA: Diagnosis not present

## 2019-06-24 DIAGNOSIS — L97521 Non-pressure chronic ulcer of other part of left foot limited to breakdown of skin: Secondary | ICD-10-CM | POA: Diagnosis not present

## 2019-06-24 DIAGNOSIS — E1151 Type 2 diabetes mellitus with diabetic peripheral angiopathy without gangrene: Secondary | ICD-10-CM | POA: Diagnosis not present

## 2019-06-25 NOTE — Progress Notes (Signed)
SOLACE, ELLIFRITZ ([id number]) Visit Report for 06/24/2019 Chief Complaint Document Details Patient Name: Bonnie Kane, Bonnie Kane Date of Service: 06/24/2019 8:45 AM Medical Record Number: [id number] Patient Account Number: [id number] Date of Birth/Sex: [birth year] (74 y.o. F) Treating RN: Primary Care Provider: Shawnie Dapper Other Clinician: Referring Provider: Shawnie Dapper Treating Provider/Extender: Melburn Hake, HOYT Weeks in Treatment: 4 Information Obtained from: Patient Chief Complaint Left LE Blisters and Edema Electronic Signature(s) Signed: 06/25/2019 12:55:39 AM By: Worthy Keeler PA-C Entered By: Worthy Keeler on 06/24/2019 08:16:46 Bonnie Kane ([id number]) -------------------------------------------------------------------------------- Debridement Details Patient Name: Bonnie Kane Date of Service: 06/24/2019 8:45 AM Medical Record Number: [id number] Patient Account Number: [id number] Date of Birth/Sex: 1946/03/18 (74 y.o. F) Treating RN: Army Melia Primary Care Provider: Shawnie Dapper Other Clinician: Referring Provider: Shawnie Dapper Treating Provider/Extender: Melburn Hake, HOYT Weeks in Treatment: 4 Debridement Performed for Wound #6 Right,Dorsal Foot Assessment: Performed By: Physician STONE III, HOYT E., PA-C Debridement Type: Debridement Severity of Tissue Pre Debridement: Fat layer exposed Level of Consciousness (Pre- Awake and Alert procedure): Pre-procedure Verification/Time Out Yes - 09:26 Taken: Start Time: 09:27 Pain Control: Lidocaine Total Area Debrided (L x W): 0.3 (cm) x 0.3 (cm) = 0.09 (cm) Tissue and other material debrided: Viable, Non-Viable, Slough, Subcutaneous, Biofilm, Slough Level: Skin/Subcutaneous Tissue Debridement Description: Excisional Instrument: Curette Bleeding: Minimum Hemostasis Achieved: Pressure End Time: 09:28 Response to Treatment: Procedure was tolerated well Level of Consciousness (Post- Awake and Alert procedure): Post  Debridement Measurements of Total Wound Length: (cm) 0.3 Width: (cm) 0.3 Depth: (cm) 0.2 Volume: (cm) 0.014 Character of Wound/Ulcer Post Debridement: Stable Severity of Tissue Post Debridement: Fat layer exposed Post Procedure Diagnosis Same as Pre-procedure Electronic Signature(s) Signed: 06/24/2019 4:58:25 PM By: Army Melia Signed: 06/25/2019 12:55:39 AM By: Worthy Keeler PA-C Entered By: Army Melia on 06/24/2019 09:28:35 Bonnie Kane ([id number]) -------------------------------------------------------------------------------- Debridement Details Patient Name: Bonnie Kane Date of Service: 06/24/2019 8:45 AM Medical Record Number: [id number] Patient Account Number: [id number] Date of Birth/Sex: 06-26-1945 (74 y.o. F) Treating RN: Army Melia Primary Care Provider: Shawnie Dapper Other Clinician: Referring Provider: Shawnie Dapper Treating Provider/Extender: Melburn Hake, HOYT Weeks in Treatment: 4 Debridement Performed for Wound #2 Left,Proximal Lower Leg Assessment: Performed By: Physician STONE III, HOYT E., PA-C Debridement Type: Debridement Level of Consciousness (Pre- Awake and Alert procedure): Pre-procedure Verification/Time Out Yes - 09:26 Taken: Start Time: 09:27 Pain Control: Lidocaine Total Area Debrided (L x W): 1.2 (cm) x 1.2 (cm) = 1.44 (cm) Tissue and other material debrided: Viable, Non-Viable, Slough, Subcutaneous, Biofilm, Slough Level: Skin/Subcutaneous Tissue Debridement Description: Excisional Instrument: Curette Bleeding: Minimum Hemostasis Achieved: Pressure End Time: 09:28 Response to Treatment: Procedure was tolerated well Level of Consciousness (Post- Awake and Alert procedure): Post Debridement Measurements of Total Wound Length: (cm) 1.2 Width: (cm) 1.2 Depth: (cm) 0.1 Volume: (cm) 0.113 Character of Wound/Ulcer Post Debridement: Stable Post Procedure Diagnosis Same as Pre-procedure Electronic Signature(s) Signed: 06/24/2019  4:58:25 PM By: Army Melia Signed: 06/25/2019 12:55:39 AM By: Worthy Keeler PA-C Entered By: Army Melia on 06/24/2019 09:29:09 Bonnie Kane ([id number]) -------------------------------------------------------------------------------- HPI Details Patient Name: Bonnie Kane Date of Service: 06/24/2019 8:45 AM Medical Record Number: [id number] Patient Account Number: [id number] Date of Birth/Sex: 05/19/1945 (74 y.o. F) Treating RN: Primary Care Provider: Shawnie Dapper Other Clinician: Referring Provider: Shawnie Dapper Treating Provider/Extender: Melburn Hake, HOYT Weeks in Treatment: 4 History of Present Illness HPI Description: 05/24/19 patient presents today for initial evaluation here in our clinic as a referral from Dr. Julien Nordmann who is her family  medicine practitioner. He has been taking care of this patient for several weeks now it appears. At least since it appears December 2020 although she has had some issue for quite some time. She has seen the vascular surgeon at [facility] and did have ABIs and TBI's which were performed back at that point. That was in September 2020. With that being said unfortunately she did not show signs of low ABIs and TBI's on the left which is where the blistering is taking place at this point her right seem to be in good shape as far as the results were concerned. She had a right ABI of 1.17 with a TBI of 0.85 and a left ABI of 0.68 with a TBI of 0.43. This is at least moderate disease. The patient does have diabetes although apparently this is diet controlled according to what they tell me at this point. She does also have congestive heart failure, bilateral lower extremity edema with a history of chronic venous stasis, systolic heart murmur, hypertension, and appears to have also had a transcatheter aortic valve replacement. Was on April 2018. The patient has been on doxycycline initially by Dr. Ernestine Conrad and then subsequently this was  switched just yesterday to Cipro. He does note in his note yesterday that she had improved but he still recommended that she come to the wound care center for further evaluation and treatment. He did also on top given her the Cipro to cover the Serratia give her Vicodin for helping with the pain he also performed a basic metabolic panel and a CBC yesterday as well. He does have a history of having been recommended compression stockings in the past although apparently this ripped skin off when they remove them according to the patient and her husband. He is present during the evaluation today as well. Is also do CBC which actually appear to be doing fairly well the white blood cell count was 7.0 and appears normal. The metabolic panel did show an elevation in the BUN and creatinine as well as the ratio at this point her glucose was 231 not be fasting but still that is an elevation range. Right now the patient is taking 40 mg of Lasix in the morning and 20 mg in the evening. He is also get a recheck the metabolic panel in a week. He notes that her kidney function was just a little bit down compared to her baseline. His most recent hemoglobin A1c was 7.2 on October 25, 2018. That was found in care everywhere from [place]. Patient's wound culture was actually performed just 4 days ago and again did show Serratia as the causative organism for the infection currently and obviously doxycycline is not to work for this that is why she was switched to Cipro. 05/31/2019 upon evaluation today patient appears to be doing well at this time with regard to her wounds on the left lower extremity. Several of the blisters that she did have have also significantly improved. These have ruptured but do not appear to be spreading and a lot of the tissue though I think will come off I do not think we have to rush as far as this is concerned she is having a lot of discomfort still that really is  disproportionate even to her wounds which seem to be improving. She did get the [place] wrap although her husband states that she is not good to be able to wear the sock portion that goes underneath as  it has something covering her toes she apparently is unable to wear anything that covers her toes. He states he is been married for close to 50 years and is never seen her where anything that even resembles a sock. 06/10/2019 upon evaluation today patient appears to be doing well with regard to her wounds at this point. She is still experiencing significant pain currently that I do believe is more neuropathic in nature I discussed this previous. Her wounds are healing quite nicely and to be honest her pain is disproportionate to the open wounds that she actually has present at this point she also does not appear to be showing any signs of infection. She does have numbness in her toes and then subsequently with regard to her wounds and even some areas that are not even open she has significant pain which I would describe is more neuropathic/nerve in nature. This was discussed with her and her husband yet again today. I did explain again today that I do not believe simply performing venous ablation is good to take care of this pain either. I did discuss possible options including since she has had issues in the past with other medications possibly trying Cymbalta her twin brother actually has a similar issue with neuropathy and neuropathic pain and takes Cymbalta with good result. I think that is at least a possibility for her to be honest. She could talk with her primary care provider about it we do not prescribe that medication at the wound center just due to the nature of follow-up this necessary for that medicine. 06/18/2019 upon evaluation today patient appears to be doing well with regard to her wounds in general. I am extremely pleased with the progress that she is made and in fact her wounds seem to be  healing quite nicely with compression. With that being said I do believe that she is going need something ongoing as far as compression is concerned. If she does not have ongoing compression then she will obviously revert back to where things were in the end. Nonetheless I do think at minimum she may need to have Tubigrip that she wears ongoing. I would discuss this in greater detail with her next week. For the time being we are more focused on trying to get her wounds healed and again she is definitely doing much better in that regard. Unfortunately I am told that when they change this that she has excruciating pain for 8 hours I really not sure why she will be having this pain obviously there is no reason that the silver alginate really should be causing pain like this and again her pain again appears to be disproportionate to the wounds at this point especially considering there healing so well. For that reason I really think that this may be Morbid just changing the dressings in general. 06/24/2019 upon evaluation today patient appears to be doing very well with regard to her wounds. She is making some progress at all wound locations. With that being said I think she may do better switching to a collagen-based dressing also think that she would probably do better with regard to her wounds we were able to debride the wounds in order to clear off the slough and biofilm that I think is slowing down the healing process. The patient is in agreement with giving this a try after some discussion of how this could benefit her. She is in agreement with that. I therefore did proceed after obtaining consent with sharp  debridement today. Electronic Signature(s) Signed: 06/24/2019 9:36:37 AM By: Worthy Keeler PA-C Entered By: Worthy Keeler on 06/24/2019 09:36:37 Bonnie Kane, Bonnie Kane ([id number]) Bonnie Kane, Bonnie Kane ([id number]) -------------------------------------------------------------------------------- Physical Exam  Details Patient Name: Bonnie Kane Date of Service: 06/24/2019 8:45 AM Medical Record Number: [id number] Patient Account Number: [id number] Date of Birth/Sex: 06-18-1945 (74 y.o. F) Treating RN: Primary Care Provider: Shawnie Dapper Other Clinician: Referring Provider: Shawnie Dapper Treating Provider/Extender: STONE III, HOYT Weeks in Treatment: 4 Constitutional Well-nourished and well-hydrated in no acute distress. Respiratory normal breathing without difficulty. Psychiatric this patient is able to make decisions and demonstrates good insight into disease process. Alert and Oriented x 3. pleasant and cooperative. Notes Upon inspection patient's wounds currently showed signs of good granulation though there was some slough and biofilm buildup noted on the surface of the wounds. I was able to actually debride the right foot and left proximal lower extremity without the patient having too significant of pain. I was very careful with this and post debridement the wound bed appears to be doing much better which is great news. At both locations. Electronic Signature(s) Signed: 06/24/2019 9:37:04 AM By: Worthy Keeler PA-C Entered By: Worthy Keeler on 06/24/2019 09:37:03 Bonnie Kane ([id number]) -------------------------------------------------------------------------------- Physician Orders Details Patient Name: Bonnie Kane Date of Service: 06/24/2019 8:45 AM Medical Record Number: [id number] Patient Account Number: [id number] Date of Birth/Sex: Mar 16, 1946 (74 y.o. F) Treating RN: Army Melia Primary Care Provider: Shawnie Dapper Other Clinician: Referring Provider: Shawnie Dapper Treating Provider/Extender: Melburn Hake, HOYT Weeks in Treatment: 4 Verbal / Phone Orders: No Diagnosis Coding ICD-10 Coding Code Description I87.2 Venous insufficiency (chronic) (peripheral) I89.0 Lymphedema, not elsewhere classified I73.89 Other specified peripheral vascular diseases L97.821 Non-pressure  chronic ulcer of other part of left lower leg limited to breakdown of skin L97.521 Non-pressure chronic ulcer of other part of left foot limited to breakdown of skin L97.512 Non-pressure chronic ulcer of other part of right foot with fat layer exposed I50.42 Chronic combined systolic (congestive) and diastolic (congestive) heart failure E11.621 Type 2 diabetes mellitus with foot ulcer Wound Cleansing Wound #1 Left Lower Leg o Clean wound with Normal Saline. o Dial antibacterial soap, wash wounds, rinse and pat dry prior to dressing wounds o May Shower, gently pat wound dry prior to applying new dressing. Wound #2 Left,Proximal Lower Leg o Clean wound with Normal Saline. o Dial antibacterial soap, wash wounds, rinse and pat dry prior to dressing wounds o May Shower, gently pat wound dry prior to applying new dressing. Wound #6 Right,Dorsal Foot o Clean wound with Normal Saline. o Dial antibacterial soap, wash wounds, rinse and pat dry prior to dressing wounds o May Shower, gently pat wound dry prior to applying new dressing. Primary Wound Dressing Wound #1 Left Lower Leg o Silver Collagen Wound #2 Left,Proximal Lower Leg o Silver Collagen Wound #6 Right,Dorsal Foot o Silver Collagen Secondary Dressing Wound #1 Left Lower Leg o [place] Wound #2 Left,Proximal Lower Leg o [place] Dressing Change Frequency Wound #1 Left Lower Leg o Change dressing every other day. Wound #2 Left,Proximal Lower Leg o Change dressing every other day. Bonnie Kane, Bonnie Kane ([id number]) Wound #6 Right,Dorsal Foot o Change dressing every other day. Follow-up Appointments Wound #1 Left Lower Leg o Return Appointment in 1 week. Wound #2 Left,Proximal Lower Leg o Return Appointment in 1 week. Wound #6 Right,Dorsal Foot o Return Appointment in 1 week. Edema Control Wound #1 Left Lower Leg o Elevate legs to the level of the heart and pump  ankles as often as  possible o Other: - TubiGrip F Wound #2 Left,Proximal Lower Leg o Elevate legs to the level of the heart and pump ankles as often as possible o Other: - TubiGrip F Wound #6 Right,Dorsal Foot o Elevate legs to the level of the heart and pump ankles as often as possible o Other: - TubiGrip F Electronic Signature(s) Signed: 06/24/2019 4:58:25 PM By: Army Melia Signed: 06/25/2019 12:55:39 AM By: Worthy Keeler PA-C Entered By: Army Melia on 06/24/2019 09:32:53 Bonnie Kane ([id number]) -------------------------------------------------------------------------------- Problem List Details Patient Name: Bonnie Kane Date of Service: 06/24/2019 8:45 AM Medical Record Number: [id number] Patient Account Number: [id number] Date of Birth/Sex: 01-Sep-1945 (74 y.o. F) Treating RN: Primary Care Provider: Shawnie Dapper Other Clinician: Referring Provider: Shawnie Dapper Treating Provider/Extender: Melburn Hake, HOYT Weeks in Treatment: 4 Active Problems ICD-10 Evaluated Encounter Code Description Active Date Today Diagnosis I87.2 Venous insufficiency (chronic) (peripheral) 05/24/2019 No Yes I89.0 Lymphedema, not elsewhere classified 05/24/2019 No Yes I73.89 Other specified peripheral vascular diseases 05/24/2019 No Yes L97.821 Non-pressure chronic ulcer of other part of left lower leg limited to 05/24/2019 No Yes breakdown of skin L97.521 Non-pressure chronic ulcer of other part of left foot limited to breakdown of 05/24/2019 No Yes skin L97.512 Non-pressure chronic ulcer of other part of right foot with fat layer exposed 06/10/2019 No Yes I50.42 Chronic combined systolic (congestive) and diastolic (congestive) heart 05/24/2019 No Yes failure E11.621 Type 2 diabetes mellitus with foot ulcer 05/24/2019 No Yes Inactive Problems Resolved Problems Electronic Signature(s) Signed: 06/25/2019 12:55:39 AM By: Worthy Keeler PA-C Entered By: Worthy Keeler on 06/24/2019 08:16:38 Bonnie Kane  ([id number]) -------------------------------------------------------------------------------- Progress Note Details Patient Name: Bonnie Kane Date of Service: 06/24/2019 8:45 AM Medical Record Number: [id number] Patient Account Number: [id number] Date of Birth/Sex: April 21, 1945 (74 y.o. F) Treating RN: Primary Care Provider: Shawnie Dapper Other Clinician: Referring Provider: Shawnie Dapper Treating Provider/Extender: Melburn Hake, HOYT Weeks in Treatment: 4 Subjective Chief Complaint Information obtained from Patient Left LE Blisters and Edema History of Present Illness (HPI) 05/24/19 patient presents today for initial evaluation here in our clinic as a referral from Dr. Julien Nordmann who is her family medicine practitioner. He has been taking care of this patient for several weeks now it appears. At least since it appears December 2020 although she has had some issue for quite some time. She has seen the vascular surgeon at [facility] and did have ABIs and TBI's which were performed back at that point. That was in September 2020. With that being said unfortunately she did not show signs of low ABIs and TBI's on the left which is where the blistering is taking place at this point her right seem to be in good shape as far as the results were concerned. She had a right ABI of 1.17 with a TBI of 0.85 and a left ABI of 0.68 with a TBI of 0.43. This is at least moderate disease. The patient does have diabetes although apparently this is diet controlled according to what they tell me at this point. She does also have congestive heart failure, bilateral lower extremity edema with a history of chronic venous stasis, systolic heart murmur, hypertension, and appears to have also had a transcatheter aortic valve replacement. Was on April 2018. The patient has been on doxycycline initially by Dr. Ernestine Conrad and then subsequently this was switched just yesterday to Cipro. He does note in  his note yesterday that she had improved but he still recommended  that she come to the wound care center for further evaluation and treatment. He did also on top given her the Cipro to cover the Serratia give her Vicodin for helping with the pain he also performed a basic metabolic panel and a CBC yesterday as well. He does have a history of having been recommended compression stockings in the past although apparently this ripped skin off when they remove them according to the patient and her husband. He is present during the evaluation today as well. Is also do CBC which actually appear to be doing fairly well the white blood cell count was 7.0 and appears normal. The metabolic panel did show an elevation in the BUN and creatinine as well as the ratio at this point her glucose was 231 not be fasting but still that is an elevation range. Right now the patient is taking 40 mg of Lasix in the morning and 20 mg in the evening. He is also get a recheck the metabolic panel in a week. He notes that her kidney function was just a little bit down compared to her baseline. His most recent hemoglobin A1c was 7.2 on October 25, 2018. That was found in care everywhere from [facility]. Patient's wound culture was actually performed just 4 days ago and again did show Serratia as the causative organism for the infection currently and obviously doxycycline is not to work for this that is why she was switched to Cipro. 05/31/2019 upon evaluation today patient appears to be doing well at this time with regard to her wounds on the left lower extremity. Several of the blisters that she did have have also significantly improved. These have ruptured but do not appear to be spreading and a lot of the tissue though I think will come off I do not think we have to rush as far as this is concerned she is having a lot of discomfort still that really is disproportionate even to her wounds which seem to be improving.  She did get the Farrow wrap although her husband states that she is not good to be able to wear the sock portion that goes underneath as it has something covering her toes she apparently is unable to wear anything that covers her toes. He states he is been married for close to 50 years and is never seen her where anything that even resembles a sock. 06/10/2019 upon evaluation today patient appears to be doing well with regard to her wounds at this point. She is still experiencing significant pain currently that I do believe is more neuropathic in nature I discussed this previous. Her wounds are healing quite nicely and to be honest her pain is disproportionate to the open wounds that she actually has present at this point she also does not appear to be showing any signs of infection. She does have numbness in her toes and then subsequently with regard to her wounds and even some areas that are not even open she has significant pain which I would describe is more neuropathic/nerve in nature. This was discussed with her and her husband yet again today. I did explain again today that I do not believe simply performing venous ablation is good to take care of this pain either. I did discuss possible options including since she has had issues in the past with other medications possibly trying Cymbalta her twin brother actually has a similar issue with neuropathy and neuropathic pain and takes Cymbalta with good result. I think  that is at least a possibility for her to be honest. She could talk with her primary care provider about it we do not prescribe that medication at the wound center just due to the nature of follow-up this necessary for that medicine. 06/18/2019 upon evaluation today patient appears to be doing well with regard to her wounds in general. I am extremely pleased with the progress that she is made and in fact her wounds seem to be healing quite nicely with compression. With that being said I do  believe that she is going need something ongoing as far as compression is concerned. If she does not have ongoing compression then she will obviously revert back to where things were in the end. Nonetheless I do think at minimum she may need to have Tubigrip that she wears ongoing. I would discuss this in greater detail with her next week. For the time being we are more focused on trying to get her wounds healed and again she is definitely doing much better in that regard. Unfortunately I am told that when they change this that she has excruciating pain for 8 hours I really not sure why she will be having this pain obviously there is no reason that the silver alginate really should be causing pain like this and again her pain again appears to be disproportionate to the wounds at this point especially considering there healing so well. For that reason I really think that this may be Morbid just changing the dressings in general. 06/24/2019 upon evaluation today patient appears to be doing very well with regard to her wounds. She is making some progress at all wound locations. With that being said I think she may do better switching to a collagen-based dressing also think that she would probably do better with regard to her wounds we were able to debride the wounds in order to clear off the slough and biofilm that I think is slowing down the healing process. The patient is in agreement with giving this a try after some discussion of how this could benefit her. She is in agreement with that. I therefore did proceed after obtaining consent with sharp debridement today. Bonnie Kane, Bonnie Kane ([id number]) Objective Constitutional Well-nourished and well-hydrated in no acute distress. Vitals Time Taken: 8:50 AM, Height: 64 in, Weight: 250 lbs, BMI: 42.9, Temperature: 98.4 F, Pulse: 86 bpm, Respiratory Rate: 20 breaths/min, Blood Pressure: 156/76 mmHg. Respiratory normal breathing without  difficulty. Psychiatric this patient is able to make decisions and demonstrates good insight into disease process. Alert and Oriented x 3. pleasant and cooperative. General Notes: Upon inspection patient's wounds currently showed signs of good granulation though there was some slough and biofilm buildup noted on the surface of the wounds. I was able to actually debride the right foot and left proximal lower extremity without the patient having too significant of pain. I was very careful with this and post debridement the wound bed appears to be doing much better which is great news. At both locations. Integumentary (Hair, Skin) Wound #1 status is Open. Original cause of wound was Blister. The wound is located on the Left Lower Leg. The wound measures 0.7cm length x 1.9cm width x 0.1cm depth; 1.045cm^2 area and 0.104cm^3 volume. There is Fat Layer (Subcutaneous Tissue) Exposed exposed. There is no tunneling or undermining noted. There is a medium amount of serous drainage noted. The wound margin is flat and intact. There is large (67-100%) pink granulation within the wound bed. There is a small (  1-33%) amount of necrotic tissue within the wound bed including Adherent Slough. Wound #2 status is Open. Original cause of wound was Blister. The wound is located on the Left,Proximal Lower Leg. The wound measures 1.2cm length x 1.2cm width x 0.1cm depth; 1.131cm^2 area and 0.113cm^3 volume. There is Fat Layer (Subcutaneous Tissue) Exposed exposed. There is no tunneling or undermining noted. There is a medium amount of serous drainage noted. The wound margin is flat and intact. There is small (1-33%) pink granulation within the wound bed. There is a large (67-100%) amount of necrotic tissue within the wound bed including Adherent Slough. Wound #6 status is Open. Original cause of wound was Gradually Appeared. The wound is located on the Right,Dorsal Foot. The wound measures 0.3cm length x 0.3cm width x 0.2cm  depth; 0.071cm^2 area and 0.014cm^3 volume. There is Fat Layer (Subcutaneous Tissue) Exposed exposed. There is no tunneling or undermining noted. There is a medium amount of drainage noted. The wound margin is flat and intact. There is small (1-33%) pink granulation within the wound bed. There is a large (67-100%) amount of necrotic tissue within the wound bed including Adherent Slough. 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 There was a Excisional Skin/Subcutaneous Tissue Bonnie Kane, Bonnie Kane ([id number]) Debridement with a total area of 1.44 sq cm performed by STONE III, HOYT E., PA-C. With the following instrument(s): Curette to remove Viable and Non-Viable tissue/material. Material removed includes Subcutaneous Tissue, Slough, and Biofilm after achieving pain control using Lidocaine. A time out was conducted at 09:26, prior to the start of the procedure. A Minimum amount of bleeding was controlled with Pressure. The procedure was tolerated well. Post Debridement Measurements: 1.2cm length x 1.2cm width x 0.1cm depth; 0.113cm^3 volume. Character of Wound/Ulcer Post Debridement is stable. Post procedure Diagnosis Wound #2: Same as Pre-Procedure Wound #6 Pre-procedure diagnosis of Wound #6 is a Diabetic Wound/Ulcer of the Lower Extremity located on the Right,Dorsal Foot .Severity of Tissue Pre Debridement  is: Fat layer exposed. There was a Excisional Skin/Subcutaneous Tissue Debridement with a total area of 0.09 sq cm performed by STONE III, HOYT E., PA-C. With the following instrument(s): Curette to remove Viable and Non-Viable tissue/material. Material removed includes Subcutaneous Tissue, Slough, and Biofilm after achieving pain control using Lidocaine. A time out was conducted at 09:26, prior to the start of the procedure. A Minimum amount of bleeding was controlled with Pressure. The procedure was tolerated well. Post Debridement Measurements: 0.3cm length x 0.3cm width x 0.2cm depth; 0.014cm^3 volume. Character of Wound/Ulcer Post Debridement is stable. Severity of Tissue Post Debridement is: Fat layer exposed. Post procedure Diagnosis Wound #6: Same as Pre-Procedure Plan Wound Cleansing: Wound #1 Left Lower Leg: Clean wound with Normal Saline. Dial antibacterial soap, wash wounds, rinse and pat dry prior to dressing wounds May Shower, gently pat wound dry prior to applying new dressing. Wound #2 Left,Proximal Lower Leg: Clean wound with Normal Saline. Dial antibacterial soap, wash wounds, rinse and pat dry prior to dressing wounds  May Shower, gently pat wound dry prior to applying new dressing. Wound #6 Right,Dorsal Foot: Clean wound with Normal Saline. Dial antibacterial soap, wash wounds, rinse and pat dry prior to dressing wounds May Shower, gently pat wound dry prior to applying new dressing. Primary Wound Dressing: Wound #1 Left Lower Leg: Silver Collagen Wound #2 Left,Proximal Lower Leg: Silver Collagen Wound #6 Right,Dorsal Foot: Silver Collagen Secondary Dressing: Wound #1 Left Lower Leg: [place] Wound #2 Left,Proximal Lower Leg: [place] Dressing Change Frequency: Wound #1 Left Lower Leg: Change dressing every other day. Wound #2 Left,Proximal Lower Leg: Change dressing every other day. Wound #6 Right,Dorsal Foot: Change dressing every other  day. Follow-up Appointments: Wound #1 Left Lower Leg: Return Appointment in 1 week. Wound #2 Left,Proximal Lower Leg: Return Appointment in 1 week. Wound #6 Right,Dorsal Foot: Return Appointment in 1 week. Edema Control: Wound #1 Left Lower Leg: Elevate legs to the level of the heart and pump ankles as often as possible Other: - TubiGrip F Wound #2 Left,Proximal Lower Leg: Elevate legs to the level of the heart and pump ankles as often as possible Other: - TubiGrip F [place], Bonnie Kane ([id number]) Wound #6 Right,Dorsal Foot: Elevate legs to the level of the heart and pump ankles as often as possible Other: - TubiGrip F 1. My suggestion currently is good to be that we continue with the wound care measures with regard to the Tubigrip. 2. I am going to suggest at this point that we also switch to collagen instead of the alginate dressing I think that is can be best for her. She really is not draining as much as she was in the past. 3. I am also can recommend that we continue with elevation is much as possible I think that we will make a big improvement for her in general as well. We will see patient back for reevaluation in 1 week here in the clinic. If anything worsens or changes patient will contact our office for additional recommendations. Electronic Signature(s) Signed: 06/24/2019 9:37:43 AM By: Worthy Keeler PA-C Entered By: Worthy Keeler on 06/24/2019 09:37:43 Bonnie Kane ([id number]) -------------------------------------------------------------------------------- SuperBill Details Patient Name: Bonnie Kane Date of Service: 06/24/2019 Medical Record Number: [id number] Patient Account Number: [id number] Date of Birth/Sex: 12-Jul-1945 (74 y.o. F) Treating RN: Primary Care Provider: Shawnie Dapper Other Clinician: Referring Provider: Shawnie Dapper Treating Provider/Extender: STONE III, HOYT Weeks in Treatment: 4 Diagnosis Coding ICD-10 Codes Code Description I87.2 Venous  insufficiency (chronic) (peripheral) I89.0 Lymphedema, not elsewhere classified I73.89 Other specified peripheral vascular diseases L97.821 Non-pressure chronic ulcer of other part of left lower leg limited to breakdown of skin L97.521 Non-pressure chronic ulcer of other part of left foot limited to breakdown of skin L97.512 Non-pressure chronic ulcer of other part of right foot with fat layer exposed I50.42 Chronic combined systolic (congestive) and diastolic (congestive) heart failure E11.621 Type 2 diabetes mellitus with foot ulcer Facility Procedures CPT4 Code: [id number] Description: 11042 - DEB SUBQ TISSUE 20 SQ CM/< Modifier: Quantity: 1 CPT4 Code: Description: ICD-10 Diagnosis Description L97.512 Non-pressure chronic ulcer of other part of right foot with fat layer exposed L97.821 Non-pressure chronic ulcer of other part of left lower leg limited to breakdo Modifier: wn of skin Quantity: Physician Procedures CPT4 [id number] Description: 11042 - WC PHYS SUBQ TISS 20 SQ CM Modifier: Quantity: 1 CPT4 Code: Description: ICD-10 Diagnosis Description L97.512 Non-pressure chronic ulcer of other part of right foot with fat layer exposed L97.821 Non-pressure chronic ulcer of other part  of left lower leg limited to breakdo Modifier: wn of skin Quantity: Electronic Signature(s) Signed: 06/24/2019 9:38:04 AM By: Worthy Keeler PA-C Entered By: Worthy Keeler on 06/24/2019 09:38:04

## 2019-06-25 NOTE — Progress Notes (Signed)
Bonnie, Kane ([id number]) Visit Report for 06/24/2019 Arrival Information Details Patient Name: Bonnie Kane, Bonnie Kane Date of Service: 06/24/2019 8:45 AM Medical Record Number: [id number] Patient Account Number: [id number] Date of Birth/Sex: 1945-06-05 (74 y.o. F) Treating RN: Montey Hora Primary Care Sherika Kubicki: Shawnie Dapper Other Clinician: Referring Barton Want: Shawnie Dapper Treating Jahkari Maclin/Extender: Melburn Hake, HOYT Weeks in Treatment: 4 Visit Information History Since Last Visit Added or deleted any medications: No Patient Arrived: Ambulatory Any new allergies or adverse reactions: No Arrival Time: 08:48 Had a fall or experienced change in No Accompanied By: husband activities of daily living that may affect Transfer Assistance: None risk of falls: Patient Identification Verified: Yes Signs or symptoms of abuse/neglect since last visito No Secondary Verification Process Completed: Yes Hospitalized since last visit: No Implantable device outside of the clinic excluding No cellular tissue based products placed in the center since last visit: Has Dressing in Place as Prescribed: Yes Has Compression in Place as Prescribed: Yes Pain Present Now: Yes Electronic Signature(s) Signed: 06/24/2019 5:03:14 PM By: Montey Hora Entered By: Montey Hora on 06/24/2019 08:49:50 Bonnie Kane ([id number]) -------------------------------------------------------------------------------- Encounter Discharge Information Details Patient Name: Bonnie Kane Date of Service: 06/24/2019 8:45 AM Medical Record Number: [id number] Patient Account Number: [id number] Date of Birth/Sex: 1945-11-16 (74 y.o. F) Treating RN: Army Melia Primary Care Sadey Yandell: Shawnie Dapper Other Clinician: Referring Oriya Kettering: Shawnie Dapper Treating Anayelli Lai/Extender: Melburn Hake, HOYT Weeks in Treatment: 4 Encounter Discharge Information Items Post Procedure Vitals Discharge Condition: Stable Temperature (F): 98.4 Ambulatory  Status: Ambulatory Pulse (bpm): 86 Discharge Destination: Home Respiratory Rate (breaths/min): 16 Transportation: Private Auto Blood Pressure (mmHg): 156/76 Accompanied By: husband Schedule Follow-up Appointment: Yes Clinical Summary of Care: Electronic Signature(s) Signed: 06/24/2019 4:58:25 PM By: Army Melia Entered By: Army Melia on 06/24/2019 09:31:10 Bonnie Kane ([id number]) -------------------------------------------------------------------------------- Lower Extremity Assessment Details Patient Name: Bonnie Kane Date of Service: 06/24/2019 8:45 AM Medical Record Number: [id number] Patient Account Number: [id number] Date of Birth/Sex: July 03, 1945 (73 y.o. F) Treating RN: Montey Hora Primary Care Kamyah Wilhelmsen: Shawnie Dapper Other Clinician: Referring Delainey Winstanley: Shawnie Dapper Treating Sydnei Ohaver/Extender: STONE III, HOYT Weeks in Treatment: 4 Edema Assessment Assessed: [Left: No] [Right: No] Edema: [Left: Yes] [Right: Yes] Vascular Assessment Pulses: Dorsalis Pedis Palpable: [Left:Yes] [Right:Yes] Electronic Signature(s) Signed: 06/24/2019 5:03:14 PM By: Montey Hora Entered By: Montey Hora on 06/24/2019 08:51:47 Bonnie Kane ([id number]) -------------------------------------------------------------------------------- Multi Wound Chart Details Patient Name: Bonnie Kane Date of Service: 06/24/2019 8:45 AM Medical Record Number: [id number] Patient Account Number: [id number] Date of Birth/Sex: 1945-11-02 (74 y.o. F) Treating RN: Army Melia Primary Care Fraidy Mccarrick: Shawnie Dapper Other Clinician: Referring Dequincy Born: Shawnie Dapper Treating Willisha Sligar/Extender: STONE III, HOYT Weeks in Treatment: 4 Vital Signs Height(in): 50 Pulse(bpm): 70 Weight(lbs): 250 Blood Pressure(mmHg): 156/76 Body Mass Index(BMI): 43 Temperature(F): 98.4 Respiratory Rate(breaths/min): 20 Photos: Wound Location: Left Lower Leg Left Lower Leg - Proximal Right Foot - Dorsal Wounding Event:  Blister Blister Gradually Appeared Primary Etiology: Trauma, Other Trauma, Other Diabetic Wound/Ulcer of the Lower Extremity Comorbid History: Type II Diabetes, Osteoarthritis, Type II Diabetes, Osteoarthritis, Type II Diabetes, Osteoarthritis, Neuropathy, Received Neuropathy, Received Neuropathy, Received Chemotherapy, Received Radiation Chemotherapy, Received Radiation Chemotherapy, Received Radiation Date Acquired: 05/01/2019 05/01/2019 06/03/2019 Weeks of Treatment: 4 4 2  Wound Status: Open Open Open Measurements L x W x D (cm) 0.7x1.9x0.1 1.2x1.2x0.1 0.3x0.3x0.2 Area (cm) : 1.045 1.131 0.071 Volume (cm) : 0.104 0.113 0.014 % Reduction in Area: 94.00% 40.00% 54.80% % Reduction in Volume: 94.00% 39.90% 12.50% Classification: Full Thickness Without Exposed Full Thickness Without Exposed Grade 1 Support Structures  Support Structures Exudate Amount: Medium Medium Medium Exudate Type: Serous Serous N/A Exudate Color: amber amber N/A Wound Margin: Flat and Intact Flat and Intact Flat and Intact Granulation Amount: Large (67-100%) Small (1-33%) Small (1-33%) Granulation Quality: Pink Pink Pink Necrotic Amount: Small (1-33%) Large (67-100%) Large (67-100%) Exposed Structures: Fat Layer (Subcutaneous Tissue) Fat Layer (Subcutaneous Tissue) Fat Layer (Subcutaneous Tissue) Exposed: Yes Exposed: Yes Exposed: Yes Fascia: No Fascia: No Fascia: No Tendon: No Tendon: No Tendon: No Muscle: No Muscle: No Muscle: No Joint: No Joint: No Joint: No Bone: No Bone: No Bone: No Epithelialization: Medium (34-66%) Small (1-33%) None Treatment Notes Electronic Signature(s) Signed: 06/24/2019 4:58:25 PM By: Orbie Hurst ([id number]) Entered By: Army Melia on 06/24/2019 09:20:28 Bonnie Kane ([id number]) -------------------------------------------------------------------------------- Multi-Disciplinary Care Plan Details Patient Name: Bonnie Kane Date of Service: 06/24/2019 8:45  AM Medical Record Number: [id number] Patient Account Number: [id number] Date of Birth/Sex: 02-15-1946 (74 y.o. F) Treating RN: Army Melia Primary Care Phelicia Dantes: Shawnie Dapper Other Clinician: Referring Jung Ingerson: Shawnie Dapper Treating Lynise Porr/Extender: Melburn Hake, HOYT Weeks in Treatment: 4 Active Inactive Abuse / Safety / Falls / Self Care Management Nursing Diagnoses: Potential for falls Goals: Patient will remain injury free related to falls Date Initiated: 05/24/2019 Target Resolution Date: 08/24/2019 Goal Status: Active Interventions: Assess fall risk on admission and as needed Notes: Orientation to the Wound Care Program Nursing Diagnoses: Knowledge deficit related to the wound healing center program Goals: Patient/caregiver will verbalize understanding of the [facility] Date Initiated: 05/24/2019 Target Resolution Date: 08/24/2019 Goal Status: Active Interventions: Provide education on orientation to the wound center Notes: Pain, Acute or Chronic Nursing Diagnoses: Pain, acute or chronic: actual or potential Goals: Patient/caregiver will verbalize adequate pain control between visits Date Initiated: 05/24/2019 Target Resolution Date: 08/24/2019 Goal Status: Active Interventions: Complete pain assessment as per visit requirements Notes: Venous Leg Ulcer Nursing Diagnoses: Potential for venous Insuffiency (use before diagnosis confirmed) FAYRENE, SARTI ([id number]) Goals: Patient will maintain optimal edema control Date Initiated: 05/24/2019 Target Resolution Date: 08/24/2019 Goal Status: Active Interventions: Assess peripheral edema status every visit. Notes: Wound/Skin Impairment Nursing Diagnoses: Impaired tissue integrity Goals: Ulcer/skin breakdown will heal within 14 weeks Date Initiated: 05/24/2019 Target Resolution Date: 08/24/2019 Goal Status: Active Interventions: Assess patient/caregiver ability to obtain necessary supplies Assess  patient/caregiver ability to perform ulcer/skin care regimen upon admission and as needed Assess ulceration(s) every visit Notes: Electronic Signature(s) Signed: 06/24/2019 4:58:25 PM By: Army Melia Entered By: Army Melia on 06/24/2019 09:20:18 Bonnie Kane ([id number]) -------------------------------------------------------------------------------- Pain Assessment Details Patient Name: Bonnie Kane Date of Service: 06/24/2019 8:45 AM Medical Record Number: [id number] Patient Account Number: [id number] Date of Birth/Sex: [birth year] (74 y.o. F) Treating RN: Montey Hora Primary Care Morris Markham: Shawnie Dapper Other Clinician: Referring Makaleigh Reinard: Shawnie Dapper Treating Gustavia Carie/Extender: STONE III, HOYT Weeks in Treatment: 4 Active Problems Location of Pain Severity and Description of Pain Patient Has Paino Yes Site Locations Pain Location: Pain in Ulcers With Dressing Change: Yes Duration of the Pain. Constant / Intermittento Constant Pain Management and Medication Current Pain Management: Electronic Signature(s) Signed: 06/24/2019 5:03:14 PM By: Montey Hora Entered By: Montey Hora on 06/24/2019 08:50:10 Bonnie Kane ([id number]) -------------------------------------------------------------------------------- Patient/Caregiver Education Details Patient Name: Bonnie Kane Date of Service: 06/24/2019 8:45 AM Medical Record Number: [id number] Patient Account Number: [id number] Date of Birth/Gender: 1945-12-19 (73 y.o. F) Treating RN: Army Melia Primary Care Physician: Shawnie Dapper Other Clinician: Referring Physician: Shawnie Dapper Treating Physician/Extender: Sharalyn Ink in Treatment: 4 Education Assessment Education Provided To:  Patient Education Topics Provided Wound/Skin Impairment: Handouts: Caring for Your Ulcer Methods: Demonstration, Explain/Verbal Responses: State content correctly Electronic Signature(s) Signed: 06/24/2019 4:58:25 PM By: Army Melia Entered By: Army Melia on 06/24/2019 09:30:24 Bonnie Kane ([id number]) -------------------------------------------------------------------------------- Wound Assessment Details Patient Name: Bonnie Kane Date of Service: 06/24/2019 8:45 AM Medical Record Number: [id number] Patient Account Number: [id number] Date of Birth/Sex: 1946-02-27 (74 y.o. F) Treating RN: Montey Hora Primary Care Tyera Hansley: Shawnie Dapper Other Clinician: Referring Cadden Elizondo: Shawnie Dapper Treating Corrinne Benegas/Extender: STONE III, HOYT Weeks in Treatment: 4 Wound Status Wound Number: 1 Primary Trauma, Other Etiology: Wound Location: Left Lower Leg Wound Open Wounding Event: Blister Status: Date Acquired: 05/01/2019 Comorbid Type II Diabetes, Osteoarthritis, Neuropathy, Received Weeks Of Treatment: 4 History: Chemotherapy, Received Radiation Clustered Wound: No Photos Wound Measurements Length: (cm) 0.7 Width: (cm) 1.9 Depth: (cm) 0.1 Area: (cm) 1.045 Volume: (cm) 0.104 % Reduction in Area: 94% % Reduction in Volume: 94% Epithelialization: Medium (34-66%) Tunneling: No Undermining: No Wound Description Classification: Full Thickness Without Exposed Support Struct Wound Margin: Flat and Intact Exudate Amount: Medium Exudate Type: Serous Exudate Color: amber ures Foul Odor After Cleansing: No Slough/Fibrino Yes Wound Bed Granulation Amount: Large (67-100%) Exposed Structure Granulation Quality: Pink Fascia Exposed: No Necrotic Amount: Small (1-33%) Fat Layer (Subcutaneous Tissue) Exposed: Yes Necrotic Quality: Adherent Slough Tendon Exposed: No Muscle Exposed: No Joint Exposed: No Bone Exposed: No Treatment Notes Wound #1 (Left Lower Leg) Notes Prisma, Telfa island, tubi F Electronic Signature(s) Signed: 06/24/2019 5:03:14 PM By: Gomez Cleverly ([id number]) Entered By: Montey Hora on 06/24/2019 09:02:18 Bonnie Kane  ([id number]) -------------------------------------------------------------------------------- Wound Assessment Details Patient Name: Bonnie Kane Date of Service: 06/24/2019 8:45 AM Medical Record Number: [id number] Patient Account Number: [id number] Date of Birth/Sex: February 19, 1946 (73 y.o. F) Treating RN: Montey Hora Primary Care Pritika Alvarez: Shawnie Dapper Other Clinician: Referring Tanina Barb: Shawnie Dapper Treating Shaira Sova/Extender: STONE III, HOYT Weeks in Treatment: 4 Wound Status Wound Number: 2 Primary Trauma, Other Etiology: Wound Location: Left Lower Leg - Proximal Wound Open Wounding Event: Blister Status: Date Acquired: 05/01/2019 Comorbid Type II Diabetes, Osteoarthritis, Neuropathy, Received Weeks Of Treatment: 4 History: Chemotherapy, Received Radiation Clustered Wound: No Photos Wound Measurements Length: (cm) 1.2 Width: (cm) 1.2 Depth: (cm) 0.1 Area: (cm) 1.131 Volume: (cm) 0.113 % Reduction in Area: 40% % Reduction in Volume: 39.9% Epithelialization: Small (1-33%) Tunneling: No Undermining: No Wound Description Classification: Full Thickness Without Exposed Support Struct Wound Margin: Flat and Intact Exudate Amount: Medium Exudate Type: Serous Exudate Color: amber ures Foul Odor After Cleansing: No Slough/Fibrino Yes Wound Bed Granulation Amount: Small (1-33%) Exposed Structure Granulation Quality: Pink Fascia Exposed: No Necrotic Amount: Large (67-100%) Fat Layer (Subcutaneous Tissue) Exposed: Yes Necrotic Quality: Adherent Slough Tendon Exposed: No Muscle Exposed: No Joint Exposed: No Bone Exposed: No Treatment Notes Wound #2 (Left, Proximal Lower Leg) Notes silver cell, ABD, conform, tubi F Electronic Signature(s) Signed: 06/24/2019 5:03:14 PM By: Gomez Cleverly ([id number]) Entered By: Montey Hora on 06/24/2019 09:02:44 Bonnie Kane  ([id number]) -------------------------------------------------------------------------------- Wound Assessment Details Patient Name: Bonnie Kane Date of Service: 06/24/2019 8:45 AM Medical Record Number: [id number] Patient Account Number: [id number] Date of Birth/Sex: 11/14/1945 (73 y.o. F) Treating RN: Montey Hora Primary Care Canna Nickelson: Shawnie Dapper Other Clinician: Referring Brooklee Michelin: Shawnie Dapper Treating Worth Kober/Extender: STONE III, HOYT Weeks in Treatment: 4 Wound Status Wound Number: 6 Primary Diabetic Wound/Ulcer of the Lower Extremity Etiology: Wound Location: Right Foot - Dorsal Wound Open Wounding Event: Gradually Appeared Status: Date Acquired: 06/03/2019 Comorbid Type II Diabetes,  Osteoarthritis, Neuropathy, Received Weeks Of Treatment: 2 History: Chemotherapy, Received Radiation Clustered Wound: No Photos Wound Measurements Length: (cm) 0.3 Width: (cm) 0.3 Depth: (cm) 0.2 Area: (cm) 0.071 Volume: (cm) 0.014 % Reduction in Area: 54.8% % Reduction in Volume: 12.5% Epithelialization: None Tunneling: No Undermining: No Wound Description Classification: Grade 1 Wound Margin: Flat and Intact Exudate Amount: Medium Foul Odor After Cleansing: No Slough/Fibrino Yes Wound Bed Granulation Amount: Small (1-33%) Exposed Structure Granulation Quality: Pink Fascia Exposed: No Necrotic Amount: Large (67-100%) Fat Layer (Subcutaneous Tissue) Exposed: Yes Necrotic Quality: Adherent Slough Tendon Exposed: No Muscle Exposed: No Joint Exposed: No Bone Exposed: No Treatment Notes Wound #6 (Right, Dorsal Foot) Notes silver cell, ABD, conform, tubi F Electronic Signature(s) Signed: 06/24/2019 5:03:14 PM By: Montey Hora Entered By: Montey Hora on 06/24/2019 09:03:07 Bonnie Kane ([id number]) Bonnie Kane ([id number]) -------------------------------------------------------------------------------- Vitals Details Patient Name: Bonnie Kane Date of Service:  06/24/2019 8:45 AM Medical Record Number: [id number] Patient Account Number: [id number] Date of Birth/Sex: 08-Sep-1945 (74 y.o. F) Treating RN: Montey Hora Primary Care Lareta Bruneau: Shawnie Dapper Other Clinician: Referring Remington Skalsky: Shawnie Dapper Treating Jaynie Hitch/Extender: STONE III, HOYT Weeks in Treatment: 4 Vital Signs Time Taken: 08:50 Temperature (F): 98.4 Height (in): 64 Pulse (bpm): 86 Weight (lbs): 250 Respiratory Rate (breaths/min): 20 Body Mass Index (BMI): 42.9 Blood Pressure (mmHg): 156/76 Reference Range: 80 - 120 mg / dl Electronic Signature(s) Signed: 06/24/2019 5:03:14 PM By: Montey Hora Entered By: Montey Hora on 06/24/2019 08:51:22

## 2019-06-27 ENCOUNTER — Other Ambulatory Visit: Payer: Self-pay | Admitting: *Deleted

## 2019-06-27 ENCOUNTER — Telehealth (HOSPITAL_COMMUNITY): Payer: Self-pay

## 2019-06-27 DIAGNOSIS — I872 Venous insufficiency (chronic) (peripheral): Secondary | ICD-10-CM

## 2019-06-27 NOTE — Telephone Encounter (Signed)

## 2019-06-28 ENCOUNTER — Ambulatory Visit (INDEPENDENT_AMBULATORY_CARE_PROVIDER_SITE_OTHER): Payer: Medicare Other | Admitting: Vascular Surgery

## 2019-06-28 ENCOUNTER — Other Ambulatory Visit: Payer: Self-pay

## 2019-06-28 ENCOUNTER — Ambulatory Visit (HOSPITAL_COMMUNITY)
Admission: RE | Admit: 2019-06-28 | Discharge: 2019-06-28 | Disposition: A | Discharge status: Home / Self Care | Payer: Medicare Other | Source: Ambulatory Visit | Attending: Vascular Surgery | Admitting: Vascular Surgery

## 2019-06-28 ENCOUNTER — Encounter: Payer: Self-pay | Admitting: Vascular Surgery

## 2019-06-28 VITALS — BP 137/72 | HR 83 | Temp 97.1°F | Resp 20 | Ht 64.0 in | Wt 265.0 lb

## 2019-06-28 DIAGNOSIS — I872 Venous insufficiency (chronic) (peripheral): Secondary | ICD-10-CM

## 2019-06-28 NOTE — Progress Notes (Signed)
Patient ID: Bonnie Kane, female   DOB: 10/30/1945, 74 y.o.   MRN: [id number]  Reason for Consult: New Patient (Initial Visit)   Referred by Tammi Sou, MD  Subjective:     HPI:  Bonnie Kane is a 74 y.o. female history bilateral lower extremity swelling and blistering.  She recently had a bleeding varicosity on her right foot.  She has attempted to lose weight as recently lost 20 pounds by watching her diet.  She does walk with the help of a cane.  Most of her ulcers or blisters around her ankle area that do weep significant amounts.  She has tried to wear compression stockings in the past but has had significant difficulty but is willing to try again.  She denies any history of DVT.  She does have heart failure for which she takes diuretics.  Past Medical History:  Diagnosis Date  . CAD (coronary artery disease) 01/2019   Noted on CT angio chest during the time of her covid illness.  EKG with evidence of inferior and inferolateral MIs.  . Chronic combined systolic and diastolic CHF (congestive heart failure) ([place]) 01/2019  . COVID-19 virus infection 01/2019   Covid test + 01/26/19-->acute hypox RF ([facility]).  . Diabetes mellitus    Type 2  . Edema of both lower extremities due to peripheral venous insufficiency   . Heart murmur, systolic [id number]   Never could get pt to get ECHO arranged in her hometown [place], [place]  Finally, when she was dx'd with breast cancer 06/2016, she had to get echo and it showed severe AS and she subsequently got TAVR.  Marland Kitchen Hepatic steatosis 2020  . History of left breast cancer 06/17/2016   Invasive ductal carcinoma-->Left mastectomy with axillary lymph node dissection, +radiation,+chemo (all done in [place], [place]).  Marland Kitchen HTN (hypertension), benign   . Hx of total knee replacement, bilateral 2018  . Hypertriglyceridemia   . Obesity   . Osteoarthritis    low back, hips, knees  . S/P TAVR (transcatheter aortic valve replacement) 07/2016   [facility]  . Transaminasemia mild 4/11   hep panel- neg   Family History  Problem Relation Age of Onset  . Other Brother        Dysrythmia  . Other Mother        CHF  . Diabetes Mother        Type 2  . Other Father        CHF  . Celiac disease Sister   . Alcohol abuse Brother   . Multiple sclerosis Brother    Past Surgical History:  Procedure Laterality Date  . APPENDECTOMY  1986  . MASTECTOMY WITH AXILLARY LYMPH NODE DISSECTION  06/2016   [place], [place]  . REPLACEMENT TOTAL KNEE BILATERAL  2018/19   [place], [place] ABDOMINAL HYSTERECTOMY  1986  . TRANSCATHETER AORTIC VALVE REPLACEMENT, TRANSFEMORAL  07/2016   [facility].  (Dr. Aline Brochure?)  Pt states she got f/u echo approx 01/2018.  Marland Kitchen TRANSTHORACIC ECHOCARDIOGRAM  01/2019   during covid illness->EF 40-45%, DD, global hypokinesis, prosthetic AV OK.    Short Social History:  Social History   Tobacco Use  . Smoking status: Former Smoker    Quit date: 04/18/1977    Years since quitting: 42.2  . Smokeless tobacco: Never Used  Substance Use Topics  . Alcohol use: No    Allergies  Allergen Reactions  . Penicillins     REACTION: neck/throat swelling  .  Gabapentin Other (See Comments)    emotional    Current Outpatient Medications  Medication Sig Dispense Refill  . acetaminophen (TYLENOL) 325 MG tablet Take by mouth.    . ASPIRIN 81 PO Take 81 mg by mouth.    . DULoxetine (CYMBALTA) 30 MG capsule Take 1 capsule (30 mg total) by mouth daily. 30 capsule 0  . furosemide (LASIX) 40 MG tablet 1 tab po bid 180 tablet 1  . HYDROcodone-acetaminophen (NORCO/VICODIN) 5-325 MG tablet Take 1-2 tablets by mouth every 6 (six) hours as needed for moderate pain. 30 tablet 0  . vitamin E 400 UNIT capsule Take by mouth daily.    Marland Kitchen zolpidem (AMBIEN) 10 MG tablet TAKE 1 TABLET BY MOUTH ONCE DAILY AT BEDTIME AS NEEDED FOR SLEEP 90 tablet 1   No current facility-administered medications for this visit.    Review of Systems  Constitutional:   Constitutional negative. HENT: HENT negative.  Eyes: Eyes negative.  Cardiovascular: Positive for leg swelling.  GI: Gastrointestinal negative.  Musculoskeletal: Positive for gait problem and leg pain.  Skin: Positive for wound.  Hematologic: Hematologic/lymphatic negative.  Psychiatric: Psychiatric negative.        Objective:  Objective   Vitals:   06/28/19 1324  BP: 137/72  Pulse: 83  Resp: 20  Temp: (!) 97.1 F (36.2 C)  SpO2: 93%  Weight: 265 lb (120.2 kg)  Height: 5\' 4"  (1.626 m)   Body mass index is 45.49 kg/m.  Physical Exam Constitutional:      Appearance: She is obese.  HENT:     Head: Normocephalic.     Nose:     Comments: Mask in place Eyes:     Pupils: Pupils are equal, round, and reactive to light.  Cardiovascular:     Rate and Rhythm: Normal rate.     Pulses:          Dorsalis pedis pulses are detected w/ Doppler on the right side and detected w/ Doppler on the left side.       Posterior tibial pulses are detected w/ Doppler on the right side and detected w/ Doppler on the left side.     Comments: Biphasic signals bilateral dorsalis pedis and posterior tibial arteries Pulmonary:     Comments: Increased breathing effort Abdominal:     General: Abdomen is flat.     Palpations: Abdomen is soft. There is no mass.  Musculoskeletal:        General: Swelling present.     Right lower leg: Edema present.     Left lower leg: Edema present.  Skin:    Capillary Refill: Capillary refill takes less than 2 seconds.     Comments: Dressings are in place bilateral ankles  Neurological:     Mental Status: She is alert.  Psychiatric:        Mood and Affect: Mood normal.        Behavior: Behavior normal.        Thought Content: Thought content normal.        Judgment: Judgment normal.     Data: I have independently turbid her left lower extremity venous reflux study.  Vein measures greatest diameter 0.78 cm at the knee and at the calf 0.89 cm there is  reflux throughout the greater saphenous vein.     Assessment/Plan:     74 year old female with multifactorial lower extremity swelling bilaterally with ulceration bilaterally.  This does not appear to be an arterial problem with biphasic signals in  both feet although I cannot feel pulses there is significant swelling that limits exam.  She does have significant reflux and large greater saphenous vein on the left which would be consistent with at least C3 venous disease as the ulceration is likely due to blistering only.  I recommended compression stockings follow-up in 3 months for evaluation of saphenous vein ablation.  At that time we will also evaluate her right lower extremity greater saphenous vein for reflux given that she has varicosities on her foot 1 with complication of recent bleeding.  She demonstrates good understanding as is her husband via telephone and they will follow up in 3 months.     Waynetta Sandy MD [facility]

## 2019-07-01 ENCOUNTER — Other Ambulatory Visit: Payer: Self-pay

## 2019-07-01 ENCOUNTER — Encounter: Payer: Medicare Other | Admitting: Physician Assistant

## 2019-07-01 DIAGNOSIS — I110 Hypertensive heart disease with heart failure: Secondary | ICD-10-CM | POA: Diagnosis not present

## 2019-07-01 DIAGNOSIS — L97521 Non-pressure chronic ulcer of other part of left foot limited to breakdown of skin: Secondary | ICD-10-CM | POA: Diagnosis not present

## 2019-07-01 DIAGNOSIS — E1151 Type 2 diabetes mellitus with diabetic peripheral angiopathy without gangrene: Secondary | ICD-10-CM | POA: Diagnosis not present

## 2019-07-01 DIAGNOSIS — I7389 Other specified peripheral vascular diseases: Secondary | ICD-10-CM | POA: Diagnosis not present

## 2019-07-01 DIAGNOSIS — L97821 Non-pressure chronic ulcer of other part of left lower leg limited to breakdown of skin: Secondary | ICD-10-CM | POA: Diagnosis not present

## 2019-07-01 DIAGNOSIS — E11621 Type 2 diabetes mellitus with foot ulcer: Secondary | ICD-10-CM | POA: Diagnosis not present

## 2019-07-01 DIAGNOSIS — S81802A Unspecified open wound, left lower leg, initial encounter: Secondary | ICD-10-CM | POA: Diagnosis not present

## 2019-07-01 DIAGNOSIS — L97512 Non-pressure chronic ulcer of other part of right foot with fat layer exposed: Secondary | ICD-10-CM | POA: Diagnosis not present

## 2019-07-01 NOTE — Progress Notes (Addendum)
Bonnie Kane, Bonnie Kane ([id number]) Visit Report for 07/01/2019 Chief Complaint Document Details Patient Name: Bonnie Kane, Bonnie Kane Date of Service: 07/01/2019 9:30 AM Medical Record Number: [id number] Patient Account Number: [id number] Date of Birth/Sex: December 19, 1945 (74 y.o. F) Treating RN: Primary Care Provider: Shawnie Dapper Other Clinician: Referring Provider: Shawnie Dapper Treating Provider/Extender: Melburn Hake, Ranette Luckadoo Weeks in Treatment: 5 Information Obtained from: Patient Chief Complaint Left LE Blisters and Edema Electronic Signature(s) Signed: 07/01/2019 9:33:42 AM By: Worthy Keeler PA-C Entered By: Worthy Keeler on 07/01/2019 09:33:41 Bonnie Kane ([id number]) -------------------------------------------------------------------------------- Debridement Details Patient Name: Bonnie Kane Date of Service: 07/01/2019 9:30 AM Medical Record Number: [id number] Patient Account Number: [id number] Date of Birth/Sex: [birth year] (73 y.o. F) Treating RN: Army Melia Primary Care Provider: Shawnie Dapper Other Clinician: Referring Provider: Shawnie Dapper Treating Provider/Extender: Melburn Hake, Broady Lafoy Weeks in Treatment: 5 Debridement Performed for Wound #2 Left,Proximal Lower Leg Assessment: Performed By: Physician STONE III, Daksh Coates E., PA-C Debridement Type: Debridement Level of Consciousness (Pre- Awake and Alert procedure): Pre-procedure Verification/Time Out Yes - 10:01 Taken: Start Time: 10:02 Pain Control: Lidocaine Total Area Debrided (L x W): 0.1 (cm) x 0.1 (cm) = 0.01 (cm) Tissue and other material debrided: Viable, Non-Viable, Slough, Subcutaneous, Slough Level: Skin/Subcutaneous Tissue Debridement Description: Excisional Instrument: Curette Bleeding: None End Time: 10:03 Response to Treatment: Procedure was tolerated well Level of Consciousness (Post- Awake and Alert procedure): Post Debridement Measurements of Total Wound Length: (cm) 0.9 Width: (cm) 0.5 Depth: (cm)  0.1 Volume: (cm) 0.035 Character of Wound/Ulcer Post Debridement: Stable Post Procedure Diagnosis Same as Pre-procedure Electronic Signature(s) Signed: 07/01/2019 1:35:00 PM By: Worthy Keeler PA-C Signed: 07/02/2019 10:13:19 AM By: Army Melia Entered By: Army Melia on 07/01/2019 10:03:36 Bonnie Kane ([id number]) -------------------------------------------------------------------------------- Debridement Details Patient Name: Bonnie Kane Date of Service: 07/01/2019 9:30 AM Medical Record Number: [id number] Patient Account Number: [id number] Date of Birth/Sex: 1946/03/06 (73 y.o. F) Treating RN: Army Melia Primary Care Provider: Shawnie Dapper Other Clinician: Referring Provider: Shawnie Dapper Treating Provider/Extender: STONE III, Callahan Wild Weeks in Treatment: 5 Debridement Performed for Wound #6 Right,Dorsal Foot Assessment: Performed By: Physician STONE III, Nannette Zill E., PA-C Debridement Type: Debridement Severity of Tissue Pre Debridement: Fat layer exposed Level of Consciousness (Pre- Awake and Alert procedure): Pre-procedure Verification/Time Out Yes - 10:01 Taken: Start Time: 10:02 Pain Control: Lidocaine Total Area Debrided (L x W): 0.1 (cm) x 0.1 (cm) = 0.01 (cm) Tissue and other material debrided: Viable, Non-Viable, Slough, Subcutaneous, Slough Level: Skin/Subcutaneous Tissue Debridement Description: Excisional Instrument: Curette Bleeding: None End Time: 10:03 Response to Treatment: Procedure was tolerated well Level of Consciousness (Post- Awake and Alert procedure): Post Debridement Measurements of Total Wound Length: (cm) 0.2 Width: (cm) 0.2 Depth: (cm) 0.1 Volume: (cm) 0.003 Character of Wound/Ulcer Post Debridement: Stable Severity of Tissue Post Debridement: Fat layer exposed Post Procedure Diagnosis Same as Pre-procedure Electronic Signature(s) Signed: 07/01/2019 1:35:00 PM By: Worthy Keeler PA-C Signed: 07/02/2019 10:13:19 AM By: Army Melia Entered By: Army Melia on 07/01/2019 10:05:30 Bonnie Kane ([id number]) -------------------------------------------------------------------------------- HPI Details Patient Name: Bonnie Kane Date of Service: 07/01/2019 9:30 AM Medical Record Number: [id number] Patient Account Number: [id number] Date of Birth/Sex: [birth year] (73 y.o. F) Treating RN: Primary Care Provider: Shawnie Dapper Other Clinician: Referring Provider: Shawnie Dapper Treating Provider/Extender: Melburn Hake, Natalya Domzalski Weeks in Treatment: 5 History of Present Illness HPI Description: 05/24/19 patient presents today for initial evaluation here in our clinic as a referral from Dr. Julien Nordmann who is her family medicine practitioner. He has been taking care of  this patient for several weeks now it appears. At least since it appears December 2020 although she has had some issue for quite some time. She has seen the vascular surgeon at [place] and did have ABIs and TBI's which were performed back at that point. That was in September 2020. With that being said unfortunately she did not show signs of low ABIs and TBI's on the left which is where the blistering is taking place at this point her right seem to be in good shape as far as the results were concerned. She had a right ABI of 1.17 with a TBI of 0.85 and a left ABI of 0.68 with a TBI of 0.43. This is at least moderate disease. The patient does have diabetes although apparently this is diet controlled according to what they tell me at this point. She does also have congestive heart failure, bilateral lower extremity edema with a history of chronic venous stasis, systolic heart murmur, hypertension, and appears to have also had a transcatheter aortic valve replacement. Was on April 2018. The patient has been on doxycycline initially by Dr. Ernestine Conrad and then subsequently this was switched just yesterday to Cipro. He does note in his note yesterday that she  had improved but he still recommended that she come to the wound care center for further evaluation and treatment. He did also on top given her the Cipro to cover the Serratia give her Vicodin for helping with the pain he also performed a basic metabolic panel and a CBC yesterday as well. He does have a history of having been recommended compression stockings in the past although apparently this ripped skin off when they remove them according to the patient and her husband. He is present during the evaluation today as well. Is also do CBC which actually appear to be doing fairly well the white blood cell count was 7.0 and appears normal. The metabolic panel did show an elevation in the BUN and creatinine as well as the ratio at this point her glucose was 231 not be fasting but still that is an elevation range. Right now the patient is taking 40 mg of Lasix in the morning and 20 mg in the evening. He is also get a recheck the metabolic panel in a week. He notes that her kidney function was just a little bit down compared to her baseline. His most recent hemoglobin A1c was 7.2 on October 25, 2018. That was found in care everywhere from [place]. Patient's wound culture was actually performed just 4 days ago and again did show Serratia as the causative organism for the infection currently and obviously doxycycline is not to work for this that is why she was switched to Cipro. 05/31/2019 upon evaluation today patient appears to be doing well at this time with regard to her wounds on the left lower extremity. Several of the blisters that she did have have also significantly improved. These have ruptured but do not appear to be spreading and a lot of the tissue though I think will come off I do not think we have to rush as far as this is concerned she is having a lot of discomfort still that really is disproportionate even to her wounds which seem to be improving. She did get the Farrow wrap  although her husband states that she is not good to be able to wear the sock portion that goes underneath as it has something covering her toes she apparently  is unable to wear anything that covers her toes. He states he is been married for close to 50 years and is never seen her where anything that even resembles a sock. 06/10/2019 upon evaluation today patient appears to be doing well with regard to her wounds at this point. She is still experiencing significant pain currently that I do believe is more neuropathic in nature I discussed this previous. Her wounds are healing quite nicely and to be honest her pain is disproportionate to the open wounds that she actually has present at this point she also does not appear to be showing any signs of infection. She does have numbness in her toes and then subsequently with regard to her wounds and even some areas that are not even open she has significant pain which I would describe is more neuropathic/nerve in nature. This was discussed with her and her husband yet again today. I did explain again today that I do not believe simply performing venous ablation is good to take care of this pain either. I did discuss possible options including since she has had issues in the past with other medications possibly trying Cymbalta her twin brother actually has a similar issue with neuropathy and neuropathic pain and takes Cymbalta with good result. I think that is at least a possibility for her to be honest. She could talk with her primary care provider about it we do not prescribe that medication at the wound center just due to the nature of follow-up this necessary for that medicine. 06/18/2019 upon evaluation today patient appears to be doing well with regard to her wounds in general. I am extremely pleased with the progress that she is made and in fact her wounds seem to be healing quite nicely with compression. With that being said I do believe that she is going  need something ongoing as far as compression is concerned. If she does not have ongoing compression then she will obviously revert back to where things were in the end. Nonetheless I do think at minimum she may need to have Tubigrip that she wears ongoing. I would discuss this in greater detail with her next week. For the time being we are more focused on trying to get her wounds healed and again she is definitely doing much better in that regard. Unfortunately I am told that when they change this that she has excruciating pain for 8 hours I really not sure why she will be having this pain obviously there is no reason that the silver alginate really should be causing pain like this and again her pain again appears to be disproportionate to the wounds at this point especially considering there healing so well. For that reason I really think that this may be Morbid just changing the dressings in general. 06/24/2019 upon evaluation today patient appears to be doing very well with regard to her wounds. She is making some progress at all wound locations. With that being said I think she may do better switching to a collagen-based dressing also think that she would probably do better with regard to her wounds we were able to debride the wounds in order to clear off the slough and biofilm that I think is slowing down the healing process. The patient is in agreement with giving this a try after some discussion of how this could benefit her. She is in agreement with that. I therefore did proceed after obtaining consent with sharp debridement today. 07/01/2019 upon evaluation today patient appears  to be doing well with regard to her wounds. She is making some progress which is great news. Fortunately there is no evidence of active infection at this time. With that being said the patient is still having significant pain which I believe is really unrelated to the wounds themselves she hurts even over the areas of her  skin where there is no pain including her feet I believe this is all related more to neuropathy. She does not see a specialist for this currently she is on Cymbalta however. Unfortunately her wounds are not closed yet although they are better after cleaning things away last week. There still is some work to do this week as well as far as keeping the area free and clear. Fortunately there is no sign in general of active infection at this point. No fevers, chills, nausea, vomiting, or diarrhea. Bonnie Kane, Bonnie Kane ([id number]) Electronic Signature(s) Signed: 07/01/2019 10:58:54 AM By: Worthy Keeler PA-C Entered By: Worthy Keeler on 07/01/2019 10:58:54 Bonnie Kane ([id number]) -------------------------------------------------------------------------------- Physical Exam Details Patient Name: Bonnie Kane Date of Service: 07/01/2019 9:30 AM Medical Record Number: [id number] Patient Account Number: [id number] Date of Birth/Sex: Aug 03, 1945 (73 y.o. F) Treating RN: Primary Care Provider: Shawnie Dapper Other Clinician: Referring Provider: Shawnie Dapper Treating Provider/Extender: STONE III, Jordon Bourquin Weeks in Treatment: 5 Constitutional Obese and well-hydrated in no acute distress. Respiratory normal breathing without difficulty. Psychiatric this patient is able to make decisions and demonstrates good insight into disease process. Alert and Oriented x 3. pleasant and cooperative. Notes Upon inspection patient's wounds did require some mild sharp debridement in regard to the right foot as well as the left proximal lower leg. In general I feel like she is doing better post debridement she did have pain however she was able to tolerate this much more effectively than even last week I believe. Post debridement I do feel like the wound beds are much cleaner and will be much more easily treatable as far as applying dressings to the wound bed. Electronic Signature(s) Signed: 07/01/2019 10:59:32 AM By: Worthy Keeler PA-C Entered By: Worthy Keeler on 07/01/2019 10:59:32 Bonnie Kane ([id number]) -------------------------------------------------------------------------------- Physician Orders Details Patient Name: Bonnie Kane Date of Service: 07/01/2019 9:30 AM Medical Record Number: [id number] Patient Account Number: [id number] Date of Birth/Sex: 1945-06-03 (73 y.o. F) Treating RN: Army Melia Primary Care Provider: Shawnie Dapper Other Clinician: Referring Provider: Shawnie Dapper Treating Provider/Extender: Melburn Hake, Juell Radney Weeks in Treatment: 5 Verbal / Phone Orders: No Diagnosis Coding ICD-10 Coding Code Description I87.2 Venous insufficiency (chronic) (peripheral) I89.0 Lymphedema, not elsewhere classified I73.89 Other specified peripheral vascular diseases L97.821 Non-pressure chronic ulcer of other part of left lower leg limited to breakdown of skin L97.521 Non-pressure chronic ulcer of other part of left foot limited to breakdown of skin L97.512 Non-pressure chronic ulcer of other part of right foot with fat layer exposed I50.42 Chronic combined systolic (congestive) and diastolic (congestive) heart failure E11.621 Type 2 diabetes mellitus with foot ulcer Wound Cleansing Wound #1 Left Lower Leg o Clean wound with Normal Saline. o Dial antibacterial soap, wash wounds, rinse and pat dry prior to dressing wounds o May Shower, gently pat wound dry prior to applying new dressing. Wound #2 Left,Proximal Lower Leg o Clean wound with Normal Saline. o Dial antibacterial soap, wash wounds, rinse and pat dry prior to dressing wounds o May Shower, gently pat wound dry prior to applying new dressing. Wound #6 Right,Dorsal Foot o Clean wound with Normal Saline. o Dial antibacterial soap,  wash wounds, rinse and pat dry prior to dressing wounds o May Shower, gently pat wound dry prior to applying new dressing. Skin Barriers/Peri-Wound Care Wound #1 Left Lower Leg o  Triamcinolone Acetonide Ointment (TCA) - and lotion Wound #2 Left,Proximal Lower Leg o Triamcinolone Acetonide Ointment (TCA) - and lotion Wound #6 Right,Dorsal Foot o Triamcinolone Acetonide Ointment (TCA) - and lotion Primary Wound Dressing Wound #1 Left Lower Leg o Collagen Wound #2 Left,Proximal Lower Leg o Collagen Wound #6 Right,Dorsal Foot o Collagen Secondary Dressing Wound #1 Left Lower Leg o ABD and Kerlix/Conform Bonnie Kane, Bonnie Kane ([id number]) Wound #2 Left,Proximal Lower Leg o ABD and Kerlix/Conform Wound #6 Right,Dorsal Foot o ABD and Kerlix/Conform Dressing Change Frequency Wound #1 Left Lower Leg o Change dressing every other day. Wound #2 Left,Proximal Lower Leg o Change dressing every other day. Wound #6 Right,Dorsal Foot o Change dressing every other day. Follow-up Appointments Wound #1 Left Lower Leg o Return Appointment in 1 week. Wound #2 Left,Proximal Lower Leg o Return Appointment in 1 week. Wound #6 Right,Dorsal Foot o Return Appointment in 1 week. Edema Control Wound #1 Left Lower Leg o Elevate legs to the level of the heart and pump ankles as often as possible o Other: - TubiGrip F Wound #2 Left,Proximal Lower Leg o Elevate legs to the level of the heart and pump ankles as often as possible o Other: - TubiGrip F Wound #6 Right,Dorsal Foot o Elevate legs to the level of the heart and pump ankles as often as possible o Other: - TubiGrip F Patient Medications Allergies: penicillin Notifications Medication Indication Start End triamcinolone acetonide 07/01/2019 DOSE 0 - topical 0.1 % ointment - ointment topical mixed with your lotion at home half and half and applied to the legs under the wraps as directed in clinic Electronic Signature(s) Signed: 07/01/2019 10:10:16 AM By: Worthy Keeler PA-C Entered By: Worthy Keeler on 07/01/2019 10:10:16 Bonnie Kane  ([id number]) -------------------------------------------------------------------------------- Problem List Details Patient Name: Bonnie Kane Date of Service: 07/01/2019 9:30 AM Medical Record Number: [id number] Patient Account Number: [id number] Date of Birth/Sex: 1945-08-21 (73 y.o. F) Treating RN: Primary Care Provider: Shawnie Dapper Other Clinician: Referring Provider: Shawnie Dapper Treating Provider/Extender: Melburn Hake, Eldean Klatt Weeks in Treatment: 5 Active Problems ICD-10 Evaluated Encounter Code Description Active Date Today Diagnosis I87.2 Venous insufficiency (chronic) (peripheral) 05/24/2019 No Yes I89.0 Lymphedema, not elsewhere classified 05/24/2019 No Yes I73.89 Other specified peripheral vascular diseases 05/24/2019 No Yes L97.821 Non-pressure chronic ulcer of other part of left lower leg limited to 05/24/2019 No Yes breakdown of skin L97.521 Non-pressure chronic ulcer of other part of left foot limited to breakdown of 05/24/2019 No Yes skin L97.512 Non-pressure chronic ulcer of other part of right foot with fat layer exposed 06/10/2019 No Yes I50.42 Chronic combined systolic (congestive) and diastolic (congestive) heart 05/24/2019 No Yes failure E11.621 Type 2 diabetes mellitus with foot ulcer 05/24/2019 No Yes Inactive Problems Resolved Problems Electronic Signature(s) Signed: 07/01/2019 9:33:35 AM By: Worthy Keeler PA-C Entered By: Worthy Keeler on 07/01/2019 09:33:35 Bonnie Kane ([id number]) -------------------------------------------------------------------------------- Progress Note Details Patient Name: Bonnie Kane Date of Service: 07/01/2019 9:30 AM Medical Record Number: [id number] Patient Account Number: [id number] Date of Birth/Sex: [birth year] (73 y.o. F) Treating RN: Primary Care Provider: Shawnie Dapper Other Clinician: Referring Provider: Shawnie Dapper Treating Provider/Extender: Melburn Hake, Annise Boran Weeks in Treatment: 5 Subjective Chief Complaint Information  obtained from Patient Left LE Blisters and Edema History of Present Illness (HPI) 05/24/19 patient presents today for initial evaluation here  in our clinic as a referral from Dr. Julien Nordmann who is her family medicine practitioner. He has been taking care of this patient for several weeks now it appears. At least since it appears December 2020 although she has had some issue for quite some time. She has seen the vascular surgeon at [place] and did have ABIs and TBI's which were performed back at that point. That was in September 2020. With that being said unfortunately she did not show signs of low ABIs and TBI's on the left which is where the blistering is taking place at this point her right seem to be in good shape as far as the results were concerned. She had a right ABI of 1.17 with a TBI of 0.85 and a left ABI of 0.68 with a TBI of 0.43. This is at least moderate disease. The patient does have diabetes although apparently this is diet controlled according to what they tell me at this point. She does also have congestive heart failure, bilateral lower extremity edema with a history of chronic venous stasis, systolic heart murmur, hypertension, and appears to have also had a transcatheter aortic valve replacement. Was on April 2018. The patient has been on doxycycline initially by Dr. Ernestine Conrad and then subsequently this was switched just yesterday to Cipro. He does note in his note yesterday that she had improved but he still recommended that she come to the wound care center for further evaluation and treatment. He did also on top given her the Cipro to cover the Serratia give her Vicodin for helping with the pain he also performed a basic metabolic panel and a CBC yesterday as well. He does have a history of having been recommended compression stockings in the past although apparently this ripped skin off when they remove them according to the patient and her husband. He is  present during the evaluation today as well. Is also do CBC which actually appear to be doing fairly well the white blood cell count was 7.0 and appears normal. The metabolic panel did show an elevation in the BUN and creatinine as well as the ratio at this point her glucose was 231 not be fasting but still that is an elevation range. Right now the patient is taking 40 mg of Lasix in the morning and 20 mg in the evening. He is also get a recheck the metabolic panel in a week. He notes that her kidney function was just a little bit down compared to her baseline. His most recent hemoglobin A1c was 7.2 on October 25, 2018. That was found in care everywhere from [place]. Patient's wound culture was actually performed just 4 days ago and again did show Serratia as the causative organism for the infection currently and obviously doxycycline is not to work for this that is why she was switched to Cipro. 05/31/2019 upon evaluation today patient appears to be doing well at this time with regard to her wounds on the left lower extremity. Several of the blisters that she did have have also significantly improved. These have ruptured but do not appear to be spreading and a lot of the tissue though I think will come off I do not think we have to rush as far as this is concerned she is having a lot of discomfort still that really is disproportionate even to her wounds which seem to be improving. She did get the Farrow wrap although her husband states that she is not  good to be able to wear the sock portion that goes underneath as it has something covering her toes she apparently is unable to wear anything that covers her toes. He states he is been married for close to 50 years and is never seen her where anything that even resembles a sock. 06/10/2019 upon evaluation today patient appears to be doing well with regard to her wounds at this point. She is still experiencing significant pain currently  that I do believe is more neuropathic in nature I discussed this previous. Her wounds are healing quite nicely and to be honest her pain is disproportionate to the open wounds that she actually has present at this point she also does not appear to be showing any signs of infection. She does have numbness in her toes and then subsequently with regard to her wounds and even some areas that are not even open she has significant pain which I would describe is more neuropathic/nerve in nature. This was discussed with her and her husband yet again today. I did explain again today that I do not believe simply performing venous ablation is good to take care of this pain either. I did discuss possible options including since she has had issues in the past with other medications possibly trying Cymbalta her twin brother actually has a similar issue with neuropathy and neuropathic pain and takes Cymbalta with good result. I think that is at least a possibility for her to be honest. She could talk with her primary care provider about it we do not prescribe that medication at the wound center just due to the nature of follow-up this necessary for that medicine. 06/18/2019 upon evaluation today patient appears to be doing well with regard to her wounds in general. I am extremely pleased with the progress that she is made and in fact her wounds seem to be healing quite nicely with compression. With that being said I do believe that she is going need something ongoing as far as compression is concerned. If she does not have ongoing compression then she will obviously revert back to where things were in the end. Nonetheless I do think at minimum she may need to have Tubigrip that she wears ongoing. I would discuss this in greater detail with her next week. For the time being we are more focused on trying to get her wounds healed and again she is definitely doing much better in that regard. Unfortunately I am told that  when they change this that she has excruciating pain for 8 hours I really not sure why she will be having this pain obviously there is no reason that the silver alginate really should be causing pain like this and again her pain again appears to be disproportionate to the wounds at this point especially considering there healing so well. For that reason I really think that this may be Morbid just changing the dressings in general. 06/24/2019 upon evaluation today patient appears to be doing very well with regard to her wounds. She is making some progress at all wound locations. With that being said I think she may do better switching to a collagen-based dressing also think that she would probably do better with regard to her wounds we were able to debride the wounds in order to clear off the slough and biofilm that I think is slowing down the healing process. The patient is in agreement with giving this a try after some discussion of how this could benefit her. She  is in agreement with that. I therefore did proceed after obtaining consent with sharp debridement today. 07/01/2019 upon evaluation today patient appears to be doing well with regard to her wounds. She is making some progress which is great news. Bonnie Kane, Bonnie Kane ([id number]) Fortunately there is no evidence of active infection at this time. With that being said the patient is still having significant pain which I believe is really unrelated to the wounds themselves she hurts even over the areas of her skin where there is no pain including her feet I believe this is all related more to neuropathy. She does not see a specialist for this currently she is on Cymbalta however. Unfortunately her wounds are not closed yet although they are better after cleaning things away last week. There still is some work to do this week as well as far as keeping the area free and clear. Fortunately there is no sign in general of active infection at this point. No  fevers, chills, nausea, vomiting, or diarrhea. Objective Constitutional Obese and well-hydrated in no acute distress. Vitals Time Taken: 9:25 AM, Height: 64 in, Weight: 250 lbs, BMI: 42.9, Temperature: 97.9 F, Pulse: 82 bpm, Respiratory Rate: 20 breaths/min, Blood Pressure: 155/78 mmHg. Respiratory normal breathing without difficulty. Psychiatric this patient is able to make decisions and demonstrates good insight into disease process. Alert and Oriented x 3. pleasant and cooperative. General Notes: Upon inspection patient's wounds did require some mild sharp debridement in regard to the right foot as well as the left proximal lower leg. In general I feel like she is doing better post debridement she did have pain however she was able to tolerate this much more effectively than even last week I believe. Post debridement I do feel like the wound beds are much cleaner and will be much more easily treatable as far as applying dressings to the wound bed. Integumentary (Hair, Skin) Wound #1 status is Open. Original cause of wound was Blister. The wound is located on the Left Lower Leg. The wound measures 0.7cm length x 1cm width x 0.1cm depth; 0.55cm^2 area and 0.055cm^3 volume. Wound #2 status is Open. Original cause of wound was Blister. The wound is located on the Left,Proximal Lower Leg. The wound measures 0.1cm length x 0.1cm width x 0.1cm depth; 0.008cm^2 area and 0.001cm^3 volume. There is Fat Layer (Subcutaneous Tissue) Exposed exposed. There is a medium amount of serous drainage noted. The wound margin is flat and intact. There is small (1-33%) pink granulation within the wound bed. There is a large (67-100%) amount of necrotic tissue within the wound bed including Adherent Slough. Wound #6 status is Open. Original cause of wound was Gradually Appeared. The wound is located on the Right,Dorsal Foot. The wound measures 0.1cm length x 0.1cm width x 0.1cm depth; 0.008cm^2 area and 0.001cm^3  volume. There is Fat Layer (Subcutaneous Tissue) Exposed exposed. There is a medium amount of drainage noted. The wound margin is flat and intact. There is small (1-33%) pink granulation within the wound bed. There is a large (67-100%) amount of necrotic tissue within the wound bed including Adherent Slough. 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 There was a Excisional Skin/Subcutaneous Tissue Debridement with a total area of 0.01 sq cm performed by STONE III, Graceson Nichelson E., PA-C. With the following instrument(s): Curette to remove Viable and Non-Viable tissue/material. Material removed includes Subcutaneous Tissue and Slough and after achieving pain control using Lidocaine. A time out was conducted at 10:01, prior to the start of the procedure. There was no bleeding. The procedure was tolerated well. Post Debridement Measurements: 0.9cm length x 0.5cm width x 0.1cm depth; 0.035cm^3 volume. Character of Wound/Ulcer Post Debridement is stable. Post procedure Diagnosis Wound #2: Same as Pre-Procedure Wound #6 Pre-procedure diagnosis of Wound #6 is a Diabetic Wound/Ulcer of the Lower Extremity located on the Right,Dorsal Foot .Severity of Tissue Pre Debridement is: Fat layer exposed. There was a Excisional Skin/Subcutaneous Tissue Debridement with a total area of 0.01 sq cm  performed by STONE III, Kenniel Bergsma E., PA-C. With the following instrument(s): Curette to remove Viable and Non-Viable tissue/material. Material removed includes Subcutaneous Tissue and Slough and after achieving pain control using Lidocaine. A time out was conducted at 10:01, prior to the start of the procedure. There was no bleeding. The procedure was tolerated well. Post Debridement Measurements: 0.2cm length x 0.2cm width x 0.1cm depth; 0.003cm^3 volume. Character of Wound/Ulcer Post Debridement is stable. Severity of Tissue Post Debridement is: Fat layer exposed. Post procedure Diagnosis Wound #6: Same as Pre-Procedure Plan Wound Cleansing: Wound #1 Left Lower Leg: Clean wound with Normal Saline. Dial antibacterial soap, wash wounds, rinse and pat dry prior to dressing wounds May Shower, gently pat wound dry prior to applying new dressing. Wound #2 Left,Proximal Lower Leg: Clean wound with Normal Saline. Dial antibacterial soap, wash wounds, rinse and pat dry prior to dressing wounds May Shower, gently pat wound dry prior to applying new dressing. Wound #6 Right,Dorsal Foot: Clean wound with Normal Saline. Dial antibacterial soap, wash wounds, rinse and pat dry prior to dressing wounds May Shower, gently pat wound dry prior to applying new dressing. Skin Barriers/Peri-Wound Care: Wound #1 Left Lower Leg: Triamcinolone Acetonide Ointment (TCA) - and lotion Wound #2 Left,Proximal Lower Leg: Triamcinolone Acetonide Ointment (TCA) - and lotion Wound #6 Right,Dorsal Foot: Triamcinolone Acetonide Ointment (TCA) - and lotion Primary Wound Dressing: Wound #1 Left Lower Leg: Collagen Wound #2 Left,Proximal Lower Leg: Collagen Wound #6 Right,Dorsal Foot: Collagen Secondary Dressing: Wound #1 Left Lower Leg: ABD and Kerlix/Conform Wound #2 Left,Proximal Lower Leg: ABD and Kerlix/Conform Wound #6 Right,Dorsal Foot: ABD and Kerlix/Conform Dressing Change Frequency: Wound #1 Left Lower  Leg: Change dressing every other day. Wound #2 Left,Proximal Lower Leg: Change dressing every other day. Wound #6 Right,Dorsal Foot: Bonnie Kane, Bonnie Kane ([id number]) Change dressing every other day. Follow-up Appointments: Wound #1 Left Lower Leg: Return Appointment in 1 week. Wound #2 Left,Proximal Lower Leg: Return Appointment in 1 week. Wound #6 Right,Dorsal Foot: Return Appointment in 1 week. Edema Control: Wound #1 Left Lower Leg: Elevate legs to the level of the heart and pump ankles as often as possible Other: - TubiGrip F Wound #2 Left,Proximal Lower Leg: Elevate legs to the level of the heart and pump ankles as often as possible Other: - TubiGrip F Wound #6 Right,Dorsal Foot: Elevate legs to the level of the heart and pump ankles as often as possible Other: - TubiGrip F The following medication(s) was prescribed: triamcinolone acetonide topical  0.1 % ointment 0 ointment topical mixed with your lotion at home half and half and applied to the legs under the wraps as directed in clinic starting 07/01/2019 1. My suggestion at this time is good to be that we go ahead and continue with the collagen dressing we will use plain collagen instead of silver at this point. 2. I would also recommend currently that we continue with the cleaning with Dial antibacterial soap followed by using the Tubigrip as well over the legs as far as trying to help with controlling some of the edema. This is good to be one of the best things for her in my opinion. 3. I did send her in a prescription for triamcinolone ointment to mix half-and-half with lotion to be applied to the legs in order to try to keep things under better control here as well. Hopefully that will help with some of the dry skin and irritation in general. 4. She is to try to elevate her legs as much as possible also to try to help with keeping the edema under good control. We will see patient back for reevaluation in 1 week here in the clinic.  If anything worsens or changes patient will contact our office for additional recommendations. Electronic Signature(s) Signed: 07/01/2019 11:01:09 AM By: Worthy Keeler PA-C Entered By: Worthy Keeler on 07/01/2019 11:01:09 Bonnie Kane ([id number]) -------------------------------------------------------------------------------- SuperBill Details Patient Name: Bonnie Kane Date of Service: 07/01/2019 Medical Record Number: [id number] Patient Account Number: [id number] Date of Birth/Sex: 1945/08/21 (73 y.o. F) Treating RN: Primary Care Provider: Shawnie Dapper Other Clinician: Referring Provider: Shawnie Dapper Treating Provider/Extender: STONE III, Mahamed Zalewski Weeks in Treatment: 5 Diagnosis Coding ICD-10 Codes Code Description I87.2 Venous insufficiency (chronic) (peripheral) I89.0 Lymphedema, not elsewhere classified I73.89 Other specified peripheral vascular diseases L97.821 Non-pressure chronic ulcer of other part of left lower leg limited to breakdown of skin L97.521 Non-pressure chronic ulcer of other part of left foot limited to breakdown of skin L97.512 Non-pressure chronic ulcer of other part of right foot with fat layer exposed I50.42 Chronic combined systolic (congestive) and diastolic (congestive) heart failure E11.621 Type 2 diabetes mellitus with foot ulcer Facility Procedures CPT4 Code: [id number] Description: 11042 - DEB SUBQ TISSUE 20 SQ CM/< Modifier: Quantity: 1 CPT4 Code: Description: ICD-10 Diagnosis Description L97.821 Non-pressure chronic ulcer of other part of left lower leg limited to breakdo L97.512 Non-pressure chronic ulcer of other part of right foot with fat layer exposed Modifier: wn of skin Quantity: Physician Procedures CPT4 [id number] Description: 99214 - WC PHYS LEVEL 4 - EST PT Modifier: 25 Quantity: 1 CPT4 Code: Description: ICD-10 Diagnosis Description I87.2 Venous insufficiency (chronic) (peripheral) I89.0 Lymphedema, not elsewhere classified  I73.89 Other specified peripheral vascular diseases L97.821 Non-pressure chronic ulcer of other part of left lower leg  limited to breakdo Modifier: wn of skin Quantity: CPT4 Code: [id number] Description: 11042 - WC PHYS SUBQ TISS 20 SQ CM Modifier: Quantity: 1 CPT4 Code: Description: ICD-10 Diagnosis Description L97.821 Non-pressure chronic ulcer of other part of left lower leg limited to breakdo L97.512 Non-pressure chronic ulcer of other part of right foot with fat layer exposed Modifier: wn of skin Quantity: Electronic Signature(s) Signed: 07/01/2019 11:01:45 AM By: Worthy Keeler PA-C Previous Signature: 07/01/2019 11:01:27 AM Version By: Worthy Keeler PA-C Entered By: Worthy Keeler on 07/01/2019 11:01:45

## 2019-07-02 ENCOUNTER — Encounter: Payer: Self-pay | Admitting: Family Medicine

## 2019-07-02 ENCOUNTER — Other Ambulatory Visit: Payer: Self-pay | Admitting: *Deleted

## 2019-07-02 DIAGNOSIS — I872 Venous insufficiency (chronic) (peripheral): Secondary | ICD-10-CM

## 2019-07-02 NOTE — Progress Notes (Signed)
Bonnie, Kane ([id number]) Visit Report for 07/01/2019 Arrival Information Details Patient Name: Bonnie Kane, Bonnie Kane Date of Service: 07/01/2019 9:30 AM Medical Record Number: [id number] Patient Account Number: [id number] Date of Birth/Sex: 1946-02-24 (74 y.o. F) Treating RN: Primary Care Cauy Melody: Shawnie Dapper Other Clinician: Referring Oakes Mccready: Shawnie Dapper Treating Jerrell Mangel/Extender: STONE III, HOYT Weeks in Treatment: 5 Visit Information History Since Last Visit Added or deleted any medications: No Patient Arrived: Cane Any new allergies or adverse reactions: No Arrival Time: 09:23 Had a fall or experienced change in No Accompanied By: husband activities of daily living that may affect Transfer Assistance: None risk of falls: Patient Identification Verified: Yes Signs or symptoms of abuse/neglect since last visito No Secondary Verification Process Completed: Yes Hospitalized since last visit: No Implantable device outside of the clinic excluding No cellular tissue based products placed in the center since last visit: Has Dressing in Place as Prescribed: Yes Has Compression in Place as Prescribed: Yes Pain Present Now: Yes Electronic Signature(s) Signed: 07/01/2019 4:23:57 PM By: Lorine Bears RCP, RRT, CHT Entered By: Lorine Bears on 07/01/2019 [id number] Bonnie Kane ([id number]) -------------------------------------------------------------------------------- Clinic Level of Care Assessment Details Patient Name: Bonnie Kane Date of Service: 07/01/2019 9:30 AM Medical Record Number: [id number] Patient Account Number: [id number] Date of Birth/Sex: 1945/09/03 (73 y.o. F) Treating RN: Army Melia Primary Care Elaf Clauson: Shawnie Dapper Other Clinician: Referring Devinn Hurwitz: Shawnie Dapper Treating Arabell Neria/Extender: Melburn Hake, HOYT Weeks in Treatment: 5 Clinic Level of Care Assessment Items TOOL 4 Quantity Score []  - Use when only an EandM is performed  on FOLLOW-UP visit 0 ASSESSMENTS - Nursing Assessment / Reassessment []  - Reassessment of Co-morbidities (includes updates in patient status) 0 []  - 0 Reassessment of Adherence to Treatment Plan ASSESSMENTS - Wound and Skin Assessment / Reassessment []  - Simple Wound Assessment / Reassessment - one wound 0 []  - 0 Complex Wound Assessment / Reassessment - multiple wounds []  - 0 Dermatologic / Skin Assessment (not related to wound area) ASSESSMENTS - Focused Assessment []  - Circumferential Edema Measurements - multi extremities 0 []  - 0 Nutritional Assessment / Counseling / Intervention []  - 0 Lower Extremity Assessment (monofilament, tuning fork, pulses) []  - 0 Peripheral Arterial Disease Assessment (using hand held doppler) ASSESSMENTS - Ostomy and/or Continence Assessment and Care []  - Incontinence Assessment and Management 0 []  - 0 Ostomy Care Assessment and Management (repouching, etc.) PROCESS - Coordination of Care []  - Simple Patient / Family Education for ongoing care 0 []  - 0 Complex (extensive) Patient / Family Education for ongoing care []  - 0 Staff obtains Programmer, systems, Records, Test Results / Process Orders []  - 0 Staff telephones HHA, Nursing Homes / Clarify orders / etc []  - 0 Routine Transfer to another Facility (non-emergent condition) []  - 0 Routine Hospital Admission (non-emergent condition) []  - 0 New Admissions / Biomedical engineer / Ordering NPWT, Apligraf, etc. []  - 0 Emergency Hospital Admission (emergent condition) []  - 0 Simple Discharge Coordination []  - 0 Complex (extensive) Discharge Coordination PROCESS - Special Needs []  - Pediatric / Minor Patient Management 0 []  - 0 Isolation Patient Management []  - 0 Hearing / Language / Visual special needs []  - 0 Assessment of Community assistance (transportation, D/C planning, etc.) Sloma, Krysteena ([id number]) []  - 0 Additional assistance / Altered mentation []  - 0 Support Surface(s) Assessment  (bed, cushion, seat, etc.) INTERVENTIONS - Wound Cleansing / Measurement []  - Simple Wound Cleansing - one wound 0 []  - 0 Complex Wound Cleansing - multiple wounds []  - 0 Wound  Imaging (photographs - any number of wounds) []  - 0 Wound Tracing (instead of photographs) []  - 0 Simple Wound Measurement - one wound []  - 0 Complex Wound Measurement - multiple wounds INTERVENTIONS - Wound Dressings []  - Small Wound Dressing one or multiple wounds 0 []  - 0 Medium Wound Dressing one or multiple wounds []  - 0 Large Wound Dressing one or multiple wounds []  - 0 Application of Medications - topical []  - 0 Application of Medications - injection INTERVENTIONS - Miscellaneous []  - External ear exam 0 []  - 0 Specimen Collection (cultures, biopsies, blood, body fluids, etc.) []  - 0 Specimen(s) / Culture(s) sent or taken to Lab for analysis []  - 0 Patient Transfer (multiple staff / Civil Service fast streamer / Similar devices) []  - 0 Simple Staple / Suture removal (25 or less) []  - 0 Complex Staple / Suture removal (26 or more) []  - 0 Hypo / Hyperglycemic Management (close monitor of Blood Glucose) []  - 0 Ankle / Brachial Index (ABI) - do not check if billed separately []  - 0 Vital Signs Has the patient been seen at the hospital within the last three years: Yes Total Score: 0 Level Of Care: ____ Electronic Signature(s) Signed: 07/02/2019 10:13:19 AM By: Army Melia Entered By: Army Melia on 07/01/2019 10:07:34 Bonnie Kane ([id number]) -------------------------------------------------------------------------------- Encounter Discharge Information Details Patient Name: Bonnie Kane Date of Service: 07/01/2019 9:30 AM Medical Record Number: [id number] Patient Account Number: [id number] Date of Birth/Sex: 05-17-1945 (74 y.o. F) Treating RN: Army Melia Primary Care Jeorgia Helming: Shawnie Dapper Other Clinician: Referring Albana Saperstein: Shawnie Dapper Treating Anik Wesch/Extender: Melburn Hake, HOYT Weeks in  Treatment: 5 Encounter Discharge Information Items Post Procedure Vitals Discharge Condition: Stable Temperature (F): 97.9 Ambulatory Status: Ambulatory Pulse (bpm): 82 Discharge Destination: Home Respiratory Rate (breaths/min): 16 Transportation: Private Auto Blood Pressure (mmHg): 155/78 Accompanied By: self Schedule Follow-up Appointment: Yes Clinical Summary of Care: Electronic Signature(s) Signed: 07/02/2019 10:13:19 AM By: Army Melia Entered By: Army Melia on 07/01/2019 10:08:44 Bonnie Kane ([id number]) -------------------------------------------------------------------------------- Lower Extremity Assessment Details Patient Name: Bonnie Kane Date of Service: 07/01/2019 9:30 AM Medical Record Number: [id number] Patient Account Number: [id number] Date of Birth/Sex: 1945-09-08 (73 y.o. F) Treating RN: Cornell Barman Primary Care Eben Choinski: Shawnie Dapper Other Clinician: Referring Dennis Hegeman: Shawnie Dapper Treating Gary Gabrielsen/Extender: Melburn Hake, HOYT Weeks in Treatment: 5 Edema Assessment Assessed: [Left: No] [Right: No] [Left: Edema] [Right: :] Calf Left: Right: Point of Measurement: 29 cm From Medial Instep 44 cm cm Ankle Left: Right: Point of Measurement: 11 cm From Medial Instep 26.5 cm cm Notes 39 to knee Electronic Signature(s) Signed: 07/01/2019 4:45:00 PM By: Gretta Cool, BSN, RN, CWS, Kim RN, BSN Entered By: Gretta Cool, BSN, RN, CWS, Kim on 07/01/2019 09:39:44 Bonnie Kane ([id number]) -------------------------------------------------------------------------------- Multi Wound Chart Details Patient Name: Bonnie Kane Date of Service: 07/01/2019 9:30 AM Medical Record Number: [id number] Patient Account Number: [id number] Date of Birth/Sex: [birth year] (73 y.o. F) Treating RN: Army Melia Primary Care Toni Demo: Shawnie Dapper Other Clinician: Referring Dinero Chavira: Shawnie Dapper Treating Anoushka Divito/Extender: STONE III, HOYT Weeks in Treatment: 5 Vital Signs Height(in):  64 Pulse(bpm): 5 Weight(lbs): 250 Blood Pressure(mmHg): 155/78 Body Mass Index(BMI): 43 Temperature(F): 97.9 Respiratory Rate(breaths/min): 20 Photos: Wound Location: Left Lower Leg Left Lower Leg - Proximal Right Foot - Dorsal Wounding Event: Blister Blister Gradually Appeared Primary Etiology: Trauma, Other Trauma, Other Diabetic Wound/Ulcer of the Lower Extremity Comorbid History: N/A Type II Diabetes, Osteoarthritis, Type II Diabetes, Osteoarthritis, Neuropathy, Received Neuropathy, Received Chemotherapy, Received Radiation Chemotherapy, Received Radiation Date Acquired: 05/01/2019 05/01/2019 06/03/2019 Weeks  of Treatment: 5 5 3  Wound Status: Open Open Open Measurements L x W x D (cm) 0.7x1x0.1 0.1x0.1x0.1 0.7x1x0.1 Area (cm) : 0.55 0.008 0.55 Volume (cm) : 0.055 0.001 0.055 % Reduction in Area: 96.80% 99.60% -250.30% % Reduction in Volume: 96.80% 99.50% -243.70% Classification: Full Thickness Without Exposed Full Thickness Without Exposed Grade 1 Support Structures Support Structures Exudate Amount: N/A Medium Medium Exudate Type: N/A Serous N/A Exudate Color: N/A amber N/A Wound Margin: N/A Flat and Intact Flat and Intact Granulation Amount: N/A Small (1-33%) Small (1-33%) Granulation Quality: N/A Pink Pink Necrotic Amount: N/A Large (67-100%) Large (67-100%) Epithelialization: N/A Small (1-33%) None Treatment Notes Electronic Signature(s) Signed: 07/02/2019 10:13:19 AM By: Army Melia Entered By: Army Melia on 07/01/2019 09:53:32 Bonnie Kane ([id number]) -------------------------------------------------------------------------------- Multi-Disciplinary Care Plan Details Patient Name: Bonnie Kane Date of Service: 07/01/2019 9:30 AM Medical Record Number: [id number] Patient Account Number: [id number] Date of Birth/Sex: [birth year] (73 y.o. F) Treating RN: Army Melia Primary Care Burnett Spray: Shawnie Dapper Other Clinician: Referring Dacian Orrico: Shawnie Dapper Treating Keyara Ent/Extender: Melburn Hake, HOYT Weeks in Treatment: 5 Active Inactive Abuse / Safety / Falls / Self Care Management Nursing Diagnoses: Potential for falls Goals: Patient will remain injury free related to falls Date Initiated: 05/24/2019 Target Resolution Date: 08/24/2019 Goal Status: Active Interventions: Assess fall risk on admission and as needed Notes: Orientation to the Wound Care Program Nursing Diagnoses: Knowledge deficit related to the wound healing center program Goals: Patient/caregiver will verbalize understanding of the [place] Program Date Initiated: 05/24/2019 Target Resolution Date: 08/24/2019 Goal Status: Active Interventions: Provide education on orientation to the wound center Notes: Pain, Acute or Chronic Nursing Diagnoses: Pain, acute or chronic: actual or potential Goals: Patient/caregiver will verbalize adequate pain control between visits Date Initiated: 05/24/2019 Target Resolution Date: 08/24/2019 Goal Status: Active Interventions: Complete pain assessment as per visit requirements Notes: Venous Leg Ulcer Nursing Diagnoses: Potential for venous Insuffiency (use before diagnosis confirmed) HADIE, BITNEY ([id number]) Goals: Patient will maintain optimal edema control Date Initiated: 05/24/2019 Target Resolution Date: 08/24/2019 Goal Status: Active Interventions: Assess peripheral edema status every visit. Notes: Wound/Skin Impairment Nursing Diagnoses: Impaired tissue integrity Goals: Ulcer/skin breakdown will heal within 14 weeks Date Initiated: 05/24/2019 Target Resolution Date: 08/24/2019 Goal Status: Active Interventions: Assess patient/caregiver ability to obtain necessary supplies Assess patient/caregiver ability to perform ulcer/skin care regimen upon admission and as needed Assess ulceration(s) every visit Notes: Electronic Signature(s) Signed: 07/02/2019 10:13:19 AM By: Army Melia Entered By: Army Melia on  07/01/2019 09:54:36 Bonnie Kane ([id number]) -------------------------------------------------------------------------------- Pain Assessment Details Patient Name: Bonnie Kane Date of Service: 07/01/2019 9:30 AM Medical Record Number: [id number] Patient Account Number: [id number] Date of Birth/Sex: 1946/03/05 (74 y.o. F) Treating RN: Cornell Barman Primary Care Rashawnda Gaba: Shawnie Dapper Other Clinician: Referring Bobette Leyh: Shawnie Dapper Treating Laker Thompson/Extender: Melburn Hake, HOYT Weeks in Treatment: 5 Active Problems Location of Pain Severity and Description of Pain Patient Has Paino Yes Site Locations Rate the pain. Current Pain Level: 2 Pain Management and Medication Current Pain Management: Electronic Signature(s) Signed: 07/01/2019 4:45:00 PM By: Gretta Cool, BSN, RN, CWS, Kim RN, BSN Entered By: Gretta Cool, BSN, RN, CWS, Kim on 07/01/2019 09:34:12 Bonnie Kane ([id number]) -------------------------------------------------------------------------------- Patient/Caregiver Education Details Patient Name: Bonnie Kane Date of Service: 07/01/2019 9:30 AM Medical Record Number: [id number] Patient Account Number: [id number] Date of Birth/Gender: Oct 05, 1945 (73 y.o. F) Treating RN: Army Melia Primary Care Physician: Shawnie Dapper Other Clinician: Referring Physician: Shawnie Dapper Treating Physician/Extender: Sharalyn Ink in Treatment: 5 Education Assessment Education Provided To: Patient  Education Topics Provided Wound/Skin Impairment: Handouts: Caring for Your Ulcer Methods: Demonstration, Explain/Verbal Responses: State content correctly Electronic Signature(s) Signed: 07/02/2019 10:13:19 AM By: Army Melia Entered By: Army Melia on 07/01/2019 10:07:46 Bonnie Kane ([id number]) -------------------------------------------------------------------------------- Wound Assessment Details Patient Name: Bonnie Kane Date of Service: 07/01/2019 9:30 AM Medical Record Number:  [id number] Patient Account Number: [id number] Date of Birth/Sex: [birth year] (74 y.o. F) Treating RN: Cornell Barman Primary Care Tywaun Hiltner: Shawnie Dapper Other Clinician: Referring Tymia Streb: Shawnie Dapper Treating Dionis Autry/Extender: STONE III, HOYT Weeks in Treatment: 5 Wound Status Wound Number: 1 Primary Etiology: Trauma, Other Wound Location: Left Lower Leg Wound Status: Open Wounding Event: Blister Date Acquired: 05/01/2019 Weeks Of Treatment: 5 Clustered Wound: No Photos Photo Uploaded By: Gretta Cool, BSN, RN, CWS, Kim on 07/01/2019 09:40:42 Wound Measurements Length: (cm) 0.7 Width: (cm) 1 Depth: (cm) 0.1 Area: (cm) 0.55 Volume: (cm) 0.055 % Reduction in Area: 96.8% % Reduction in Volume: 96.8% Wound Description Classification: Full Thickness Without Exposed Support Structu res Treatment Notes Wound #1 (Left Lower Leg) Notes collagen, ABD, conform, tubi Electronic Signature(s) Signed: 07/01/2019 4:45:00 PM By: Gretta Cool, BSN, RN, CWS, Kim RN, BSN Entered By: Gretta Cool, BSN, RN, CWS, Kim on 07/01/2019 09:36:34 Bonnie Kane ([id number]) -------------------------------------------------------------------------------- Wound Assessment Details Patient Name: Bonnie Kane Date of Service: 07/01/2019 9:30 AM Medical Record Number: [id number] Patient Account Number: [id number] Date of Birth/Sex: 06-05-1945 (73 y.o. F) Treating RN: Army Melia Primary Care Osinachi Navarrette: Shawnie Dapper Other Clinician: Referring Nohea Kras: Shawnie Dapper Treating Alexxia Stankiewicz/Extender: STONE III, HOYT Weeks in Treatment: 5 Wound Status Wound Number: 2 Primary Trauma, Other Etiology: Wound Location: Left, Proximal Lower Leg Wound Open Wounding Event: Blister Status: Date Acquired: 05/01/2019 Comorbid Type II Diabetes, Osteoarthritis, Neuropathy, Received Weeks Of Treatment: 5 History: Chemotherapy, Received Radiation Clustered Wound: No Photos Wound Measurements Length: (cm) 0.1 Width: (cm) 0.1 Depth:  (cm) 0.1 Area: (cm) 0.008 Volume: (cm) 0.001 % Reduction in Area: 99.6% % Reduction in Volume: 99.5% Epithelialization: Small (1-33%) Wound Description Classification: Full Thickness Without Exposed Support Structu Wound Margin: Flat and Intact Exudate Amount: Medium Exudate Type: Serous Exudate Color: amber res Foul Odor After Cleansing: No Slough/Fibrino Yes Wound Bed Granulation Amount: Small (1-33%) Exposed Structure Granulation Quality: Pink Fascia Exposed: No Necrotic Amount: Large (67-100%) Fat Layer (Subcutaneous Tissue) Exposed: Yes Necrotic Quality: Adherent Slough Tendon Exposed: No Muscle Exposed: No Joint Exposed: No Bone Exposed: No Treatment Notes Wound #2 (Left, Proximal Lower Leg) Notes collagen, ABD, conform, tubi Electronic Signature(s) Signed: 07/02/2019 10:13:19 AM By: Orbie Hurst ([id number]) Entered By: Army Melia on 07/01/2019 09:54:01 Bonnie Kane ([id number]) -------------------------------------------------------------------------------- Wound Assessment Details Patient Name: Bonnie Kane Date of Service: 07/01/2019 9:30 AM Medical Record Number: [id number] Patient Account Number: [id number] Date of Birth/Sex: [birth year] (74 y.o. F) Treating RN: Army Melia Primary Care Michille Mcelrath: Shawnie Dapper Other Clinician: Referring Karlin Binion: Shawnie Dapper Treating Cristie Mckinney/Extender: STONE III, HOYT Weeks in Treatment: 5 Wound Status Wound Number: 6 Primary Diabetic Wound/Ulcer of the Lower Extremity Etiology: Wound Location: Right, Dorsal Foot Wound Open Wounding Event: Gradually Appeared Status: Date Acquired: 06/03/2019 Comorbid Type II Diabetes, Osteoarthritis, Neuropathy, Received Weeks Of Treatment: 3 History: Chemotherapy, Received Radiation Clustered Wound: No Photos Wound Measurements Length: (cm) 0.1 Width: (cm) 0.1 Depth: (cm) 0.1 Area: (cm) 0.008 Volume: (cm) 0.001 % Reduction in Area: 94.9% % Reduction in  Volume: 93.8% Epithelialization: None Wound Description Classification: Grade 1 Wound Margin: Flat and Intact Exudate Amount: Medium Foul Odor After Cleansing: No Slough/Fibrino Yes Wound Bed Granulation Amount: Small (1-33%) Exposed Structure Granulation  Quality: Pink Fascia Exposed: No Necrotic Amount: Large (67-100%) Fat Layer (Subcutaneous Tissue) Exposed: Yes Necrotic Quality: Adherent Slough Tendon Exposed: No Muscle Exposed: No Joint Exposed: No Bone Exposed: No Treatment Notes Wound #6 (Right, Dorsal Foot) Notes collagen, ABD, conform, tubi Electronic Signature(s) Signed: 07/02/2019 10:13:19 AM By: Army Melia Entered By: Army Melia on 07/01/2019 09:54:06 Bonnie Kane ([id number]) Bonnie Kane ([id number]) -------------------------------------------------------------------------------- Vitals Details Patient Name: Bonnie Kane Date of Service: 07/01/2019 9:30 AM Medical Record Number: [id number] Patient Account Number: [id number] Date of Birth/Sex: November 04, 1945 (74 y.o. F) Treating RN: Primary Care Rushi Chasen: Shawnie Dapper Other Clinician: Referring Desiree Daise: Shawnie Dapper Treating Jaycion Treml/Extender: STONE III, HOYT Weeks in Treatment: 5 Vital Signs Time Taken: 09:25 Temperature (F): 97.9 Height (in): 64 Pulse (bpm): 82 Weight (lbs): 250 Respiratory Rate (breaths/min): 20 Body Mass Index (BMI): 42.9 Blood Pressure (mmHg): 155/78 Reference Range: 80 - 120 mg / dl Electronic Signature(s) Signed: 07/01/2019 4:23:57 PM By: Lorine Bears RCP, RRT, CHT Entered By: Becky Sax, Amado Nash on 07/01/2019 09:27:58

## 2019-07-08 ENCOUNTER — Encounter: Payer: Medicare Other | Admitting: Physician Assistant

## 2019-07-08 ENCOUNTER — Other Ambulatory Visit: Payer: Self-pay

## 2019-07-08 DIAGNOSIS — L97521 Non-pressure chronic ulcer of other part of left foot limited to breakdown of skin: Secondary | ICD-10-CM | POA: Diagnosis not present

## 2019-07-08 DIAGNOSIS — L97512 Non-pressure chronic ulcer of other part of right foot with fat layer exposed: Secondary | ICD-10-CM | POA: Diagnosis not present

## 2019-07-08 DIAGNOSIS — I110 Hypertensive heart disease with heart failure: Secondary | ICD-10-CM | POA: Diagnosis not present

## 2019-07-08 DIAGNOSIS — E11621 Type 2 diabetes mellitus with foot ulcer: Secondary | ICD-10-CM | POA: Diagnosis not present

## 2019-07-08 DIAGNOSIS — E1151 Type 2 diabetes mellitus with diabetic peripheral angiopathy without gangrene: Secondary | ICD-10-CM | POA: Diagnosis not present

## 2019-07-08 DIAGNOSIS — L97821 Non-pressure chronic ulcer of other part of left lower leg limited to breakdown of skin: Secondary | ICD-10-CM | POA: Diagnosis not present

## 2019-07-08 NOTE — Progress Notes (Addendum)
ADRENA, YARBRO ([id number]) Visit Report for 07/08/2019 Chief Complaint Document Details Patient Name: Bonnie Kane Date of Service: 07/08/2019 9:30 AM Medical Record Number: [id number] Patient Account Number: [id number] Date of Birth/Sex: 1946/02/18 (74 y.o. F) Treating RN: Army Melia Primary Care Provider: Shawnie Dapper Other Clinician: Referring Provider: Shawnie Dapper Treating Provider/Extender: Melburn Hake, Buster Schueller Weeks in Treatment: 6 Information Obtained from: Patient Chief Complaint Left LE Blisters and Edema Electronic Signature(s) Signed: 07/08/2019 9:28:53 AM By: Worthy Keeler PA-C Entered By: Worthy Keeler on 07/08/2019 09:28:52 Bonnie Kane ([id number]) -------------------------------------------------------------------------------- Debridement Details Patient Name: Bonnie Kane Date of Service: 07/08/2019 9:30 AM Medical Record Number: [id number] Patient Account Number: [id number] Date of Birth/Sex: 03-Feb-1946 (73 y.o. F) Treating RN: Army Melia Primary Care Provider: Shawnie Dapper Other Clinician: Referring Provider: Shawnie Dapper Treating Provider/Extender: Melburn Hake, Grantley Savage Weeks in Treatment: 6 Debridement Performed for Wound #1 Left Lower Leg Assessment: Performed By: Physician STONE III, Jaliza Seifried E., PA-C Debridement Type: Debridement Level of Consciousness (Pre- Awake and Alert procedure): Pre-procedure Verification/Time Out Yes - 09:55 Taken: Start Time: 09:56 Pain Control: Lidocaine Total Area Debrided (L x W): 0.3 (cm) x 0.3 (cm) = 0.09 (cm) Tissue and other material debrided: Viable, Non-Viable, Slough, Subcutaneous, Slough Level: Skin/Subcutaneous Tissue Debridement Description: Excisional Instrument: Curette Bleeding: None End Time: 09:57 Response to Treatment: Procedure was tolerated well Level of Consciousness (Post- Awake and Alert procedure): Post Debridement Measurements of Total Wound Length: (cm) 0.3 Width: (cm) 0.3 Depth: (cm)  0.1 Volume: (cm) 0.007 Character of Wound/Ulcer Post Debridement: Stable Post Procedure Diagnosis Same as Pre-procedure Electronic Signature(s) Signed: 07/08/2019 4:14:01 PM By: Army Melia Signed: 07/08/2019 4:32:11 PM By: Worthy Keeler PA-C Entered By: Army Melia on 07/08/2019 09:58:02 Bonnie Kane ([id number]) -------------------------------------------------------------------------------- Debridement Details Patient Name: Bonnie Kane Date of Service: 07/08/2019 9:30 AM Medical Record Number: [id number] Patient Account Number: [id number] Date of Birth/Sex: 1945/07/15 (73 y.o. F) Treating RN: Army Melia Primary Care Provider: Shawnie Dapper Other Clinician: Referring Provider: Shawnie Dapper Treating Provider/Extender: Melburn Hake, Maleta Pacha Weeks in Treatment: 6 Debridement Performed for Wound #2 Left,Proximal Lower Leg Assessment: Performed By: Physician STONE III, Junaid Wurzer E., PA-C Debridement Type: Debridement Level of Consciousness (Pre- Awake and Alert procedure): Pre-procedure Verification/Time Out Yes - 09:55 Taken: Start Time: 09:56 Pain Control: Lidocaine Total Area Debrided (L x W): 0.3 (cm) x 0.3 (cm) = 0.09 (cm) Tissue and other material debrided: Viable, Non-Viable, Slough, Subcutaneous, Slough Level: Skin/Subcutaneous Tissue Debridement Description: Excisional Instrument: Curette Bleeding: None End Time: 09:57 Response to Treatment: Procedure was tolerated well Level of Consciousness (Post- Awake and Alert procedure): Post Debridement Measurements of Total Wound Length: (cm) 0.3 Width: (cm) 0.3 Depth: (cm) 0.1 Volume: (cm) 0.007 Character of Wound/Ulcer Post Debridement: Stable Post Procedure Diagnosis Same as Pre-procedure Electronic Signature(s) Signed: 07/08/2019 4:14:01 PM By: Army Melia Signed: 07/08/2019 4:32:11 PM By: Worthy Keeler PA-C Entered By: Army Melia on 07/08/2019 09:58:22 Bonnie Kane  ([id number]) -------------------------------------------------------------------------------- HPI Details Patient Name: Bonnie Kane Date of Service: 07/08/2019 9:30 AM Medical Record Number: [id number] Patient Account Number: [id number] Date of Birth/Sex: [birth year] (73 y.o. F) Treating RN: Army Melia Primary Care Provider: Shawnie Dapper Other Clinician: Referring Provider: Shawnie Dapper Treating Provider/Extender: Melburn Hake, Emmit Oriley Weeks in Treatment: 6 History of Present Illness HPI Description: 05/24/19 patient presents today for initial evaluation here in our clinic as a referral from Dr. Julien Nordmann who is her family medicine practitioner. He has been taking care of this patient for several weeks now it appears. At least since  it appears December 2020 although she has had some issue for quite some time. She has seen the vascular surgeon at [place] and did have ABIs and TBI's which were performed back at that point. That was in September 2020. With that being said unfortunately she did not show signs of low ABIs and TBI's on the left which is where the blistering is taking place at this point her right seem to be in good shape as far as the results were concerned. She had a right ABI of 1.17 with a TBI of 0.85 and a left ABI of 0.68 with a TBI of 0.43. This is at least moderate disease. The patient does have diabetes although apparently this is diet controlled according to what they tell me at this point. She does also have congestive heart failure, bilateral lower extremity edema with a history of chronic venous stasis, systolic heart murmur, hypertension, and appears to have also had a transcatheter aortic valve replacement. Was on April 2018. The patient has been on doxycycline initially by Dr. Ernestine Conrad and then subsequently this was switched just yesterday to Cipro. He does note in his note yesterday that she had improved but he still recommended that she come to  the wound care center for further evaluation and treatment. He did also on top given her the Cipro to cover the Serratia give her Vicodin for helping with the pain he also performed a basic metabolic panel and a CBC yesterday as well. He does have a history of having been recommended compression stockings in the past although apparently this ripped skin off when they remove them according to the patient and her husband. He is present during the evaluation today as well. Is also do CBC which actually appear to be doing fairly well the white blood cell count was 7.0 and appears normal. The metabolic panel did show an elevation in the BUN and creatinine as well as the ratio at this point her glucose was 231 not be fasting but still that is an elevation range. Right now the patient is taking 40 mg of Lasix in the morning and 20 mg in the evening. He is also get a recheck the metabolic panel in a week. He notes that her kidney function was just a little bit down compared to her baseline. His most recent hemoglobin A1c was 7.2 on October 25, 2018. That was found in care everywhere from [place]. Patient's wound culture was actually performed just 4 days ago and again did show Serratia as the causative organism for the infection currently and obviously doxycycline is not to work for this that is why she was switched to Cipro. 05/31/2019 upon evaluation today patient appears to be doing well at this time with regard to her wounds on the left lower extremity. Several of the blisters that she did have have also significantly improved. These have ruptured but do not appear to be spreading and a lot of the tissue though I think will come off I do not think we have to rush as far as this is concerned she is having a lot of discomfort still that really is disproportionate even to her wounds which seem to be improving. She did get the Farrow wrap although her husband states that she is not good to be  able to wear the sock portion that goes underneath as it has something covering her toes she apparently is unable to wear anything that covers her toes. He states  he is been married for close to 50 years and is never seen her where anything that even resembles a sock. 06/10/2019 upon evaluation today patient appears to be doing well with regard to her wounds at this point. She is still experiencing significant pain currently that I do believe is more neuropathic in nature I discussed this previous. Her wounds are healing quite nicely and to be honest her pain is disproportionate to the open wounds that she actually has present at this point she also does not appear to be showing any signs of infection. She does have numbness in her toes and then subsequently with regard to her wounds and even some areas that are not even open she has significant pain which I would describe is more neuropathic/nerve in nature. This was discussed with her and her husband yet again today. I did explain again today that I do not believe simply performing venous ablation is good to take care of this pain either. I did discuss possible options including since she has had issues in the past with other medications possibly trying Cymbalta her twin brother actually has a similar issue with neuropathy and neuropathic pain and takes Cymbalta with good result. I think that is at least a possibility for her to be honest. She could talk with her primary care provider about it we do not prescribe that medication at the wound center just due to the nature of follow-up this necessary for that medicine. 06/18/2019 upon evaluation today patient appears to be doing well with regard to her wounds in general. I am extremely pleased with the progress that she is made and in fact her wounds seem to be healing quite nicely with compression. With that being said I do believe that she is going need something ongoing as far as compression is  concerned. If she does not have ongoing compression then she will obviously revert back to where things were in the end. Nonetheless I do think at minimum she may need to have Tubigrip that she wears ongoing. I would discuss this in greater detail with her next week. For the time being we are more focused on trying to get her wounds healed and again she is definitely doing much better in that regard. Unfortunately I am told that when they change this that she has excruciating pain for 8 hours I really not sure why she will be having this pain obviously there is no reason that the silver alginate really should be causing pain like this and again her pain again appears to be disproportionate to the wounds at this point especially considering there healing so well. For that reason I really think that this may be Morbid just changing the dressings in general. 06/24/2019 upon evaluation today patient appears to be doing very well with regard to her wounds. She is making some progress at all wound locations. With that being said I think she may do better switching to a collagen-based dressing also think that she would probably do better with regard to her wounds we were able to debride the wounds in order to clear off the slough and biofilm that I think is slowing down the healing process. The patient is in agreement with giving this a try after some discussion of how this could benefit her. She is in agreement with that. I therefore did proceed after obtaining consent with sharp debridement today. 07/01/2019 upon evaluation today patient appears to be doing well with regard to her wounds. She is  making some progress which is great news. Fortunately there is no evidence of active infection at this time. With that being said the patient is still having significant pain which I believe is really unrelated to the wounds themselves she hurts even over the areas of her skin where there is no pain including her feet I  believe this is all related more to neuropathy. She does not see a specialist for this currently she is on Cymbalta however. Unfortunately her wounds are not closed yet although they are better after cleaning things away last week. There still is some work to do this week as well as far as keeping the area free and clear. Fortunately there is no sign in general of active infection at this point. No fevers, chills, nausea, vomiting, or diarrhea. Bonnie Kane, Bonnie Kane ([id number]) 07/08/2019 upon evaluation today patient appears to be doing better for the most part compared to previous evaluation. With that being said she does have some of the collagen/dressing material stuck to the wound beds of the left lower extremity. This fortunately was able to be removed by way of sharp debridement without damaging any of the good healing underneath at both locations. With that being said there fortunately is no signs of active infection at this time which is great news. No fever chills noted. The patient apparently has not been putting any moisture on the dressings before putting them on the leg area. Subsequently this is 1 reason why it probably dried out although I think the other is actually that to be honest the wounds are not draining nearly as much as they used to simply due to the fact that she is healing quite well. I am really not sure why they quit using any moisture on the dressings as far as saline was concerned other than they tell us that we told him not to but that was not the case. Electronic Signature(s) Signed: 07/08/2019 10:16:55 AM By: Worthy Keeler PA-C Entered By: Worthy Keeler on 07/08/2019 10:16:54 Bonnie Kane, Bonnie Kane ([id number]) -------------------------------------------------------------------------------- Physical Exam Details Patient Name: Bonnie Kane Date of Service: 07/08/2019 9:30 AM Medical Record Number: [id number] Patient Account Number: [id number] Date of Birth/Sex: 05-13-1945 (73 y.o.  F) Treating RN: Army Melia Primary Care Provider: Shawnie Dapper Other Clinician: Referring Provider: Shawnie Dapper Treating Provider/Extender: STONE III, Aviance Cooperwood Weeks in Treatment: 6 Constitutional Well-nourished and well-hydrated in no acute distress. Respiratory normal breathing without difficulty. Psychiatric this patient is able to make decisions and demonstrates good insight into disease process. Alert and Oriented x 3. pleasant and cooperative. Notes Upon evaluation today the patient seems to be doing well with regard to her wounds once I was able to remove some of the dry dressing check she has a lot of healing underneath and I think the collagen is doing very well for her. She still is having pain on the left although there is really no pain on the top of her right foot. The left leg is mainly this locale for discomfort although to be honest I think the main issue here is neuropathy as the wounds are healing quite nicely. Electronic Signature(s) Signed: 07/08/2019 10:31:20 AM By: Worthy Keeler PA-C Entered By: Worthy Keeler on 07/08/2019 10:31:19 Bonnie Kane ([id number]) -------------------------------------------------------------------------------- Physician Orders Details Patient Name: Bonnie Kane Date of Service: 07/08/2019 9:30 AM Medical Record Number: [id number] Patient Account Number: [id number] Date of Birth/Sex: 29-Aug-1945 (73 y.o. F) Treating RN: Army Melia Primary Care Provider: Shawnie Dapper Other Clinician: Referring Provider: Shawnie Dapper Treating  Provider/Extender: Melburn Hake, Ashiyah Pavlak Weeks in Treatment: 6 Verbal / Phone Orders: No Diagnosis Coding ICD-10 Coding Code Description I87.2 Venous insufficiency (chronic) (peripheral) I89.0 Lymphedema, not elsewhere classified I73.89 Other specified peripheral vascular diseases L97.821 Non-pressure chronic ulcer of other part of left lower leg limited to breakdown of skin L97.521 Non-pressure chronic  ulcer of other part of left foot limited to breakdown of skin L97.512 Non-pressure chronic ulcer of other part of right foot with fat layer exposed I50.42 Chronic combined systolic (congestive) and diastolic (congestive) heart failure E11.621 Type 2 diabetes mellitus with foot ulcer Wound Cleansing Wound #1 Left Lower Leg o Clean wound with Normal Saline. o Dial antibacterial soap, wash wounds, rinse and pat dry prior to dressing wounds o May Shower, gently pat wound dry prior to applying new dressing. Wound #2 Left,Proximal Lower Leg o Clean wound with Normal Saline. o Dial antibacterial soap, wash wounds, rinse and pat dry prior to dressing wounds o May Shower, gently pat wound dry prior to applying new dressing. Wound #6 Right,Dorsal Foot o Clean wound with Normal Saline. o Dial antibacterial soap, wash wounds, rinse and pat dry prior to dressing wounds o May Shower, gently pat wound dry prior to applying new dressing. Skin Barriers/Peri-Wound Care Wound #1 Left Lower Leg o Triamcinolone Acetonide Ointment (TCA) - and lotion Wound #2 Left,Proximal Lower Leg o Triamcinolone Acetonide Ointment (TCA) - and lotion Wound #6 Right,Dorsal Foot o Triamcinolone Acetonide Ointment (TCA) - and lotion Primary Wound Dressing Wound #1 Left Lower Leg o Collagen - moisten with hydrogel Wound #2 Left,Proximal Lower Leg o Collagen - moisten with hydrogel Wound #6 Right,Dorsal Foot o Collagen - moisten with hydrogel Secondary Dressing Wound #1 Left Lower Leg o ABD and Kerlix/Conform Rosten, Katelinn ([id number]) Wound #2 Left,Proximal Lower Leg o ABD and Kerlix/Conform Wound #6 Right,Dorsal Foot o ABD and Kerlix/Conform Dressing Change Frequency Wound #1 Left Lower Leg o Change dressing every other day. Wound #2 Left,Proximal Lower Leg o Change dressing every other day. Wound #6 Right,Dorsal Foot o Change dressing every other day. Follow-up  Appointments Wound #1 Left Lower Leg o Return Appointment in 1 week. Wound #2 Left,Proximal Lower Leg o Return Appointment in 1 week. Wound #6 Right,Dorsal Foot o Return Appointment in 1 week. Edema Control Wound #1 Left Lower Leg o Elevate legs to the level of the heart and pump ankles as often as possible o Other: - TubiGrip F Wound #2 Left,Proximal Lower Leg o Elevate legs to the level of the heart and pump ankles as often as possible o Other: - TubiGrip F Wound #6 Right,Dorsal Foot o Elevate legs to the level of the heart and pump ankles as often as possible o Other: - TubiGrip F Electronic Signature(s) Signed: 07/08/2019 4:14:01 PM By: Army Melia Signed: 07/08/2019 4:32:11 PM By: Worthy Keeler PA-C Entered By: Army Melia on 07/08/2019 09:59:56 Bonnie Kane ([id number]) -------------------------------------------------------------------------------- Problem List Details Patient Name: Bonnie Kane Date of Service: 07/08/2019 9:30 AM Medical Record Number: [id number] Patient Account Number: [id number] Date of Birth/Sex: 28-Oct-1945 (73 y.o. F) Treating RN: Army Melia Primary Care Provider: Shawnie Dapper Other Clinician: Referring Provider: Shawnie Dapper Treating Provider/Extender: Melburn Hake, Tonilynn Bieker Weeks in Treatment: 6 Active Problems ICD-10 Evaluated Encounter Code Description Active Date Today Diagnosis I87.2 Venous insufficiency (chronic) (peripheral) 05/24/2019 No Yes I89.0 Lymphedema, not elsewhere classified 05/24/2019 No Yes I73.89 Other specified peripheral vascular diseases 05/24/2019 No Yes L97.821 Non-pressure chronic ulcer of other part of left lower leg limited to 05/24/2019 No Yes  breakdown of skin L97.521 Non-pressure chronic ulcer of other part of left foot limited to breakdown of 05/24/2019 No Yes skin L97.512 Non-pressure chronic ulcer of other part of right foot with fat layer exposed 06/10/2019 No Yes I50.42 Chronic combined systolic  (congestive) and diastolic (congestive) heart 05/24/2019 No Yes failure E11.621 Type 2 diabetes mellitus with foot ulcer 05/24/2019 No Yes Inactive Problems Resolved Problems Electronic Signature(s) Signed: 07/08/2019 9:28:47 AM By: Worthy Keeler PA-C Entered By: Worthy Keeler on 07/08/2019 09:28:46 Bonnie Kane ([id number]) -------------------------------------------------------------------------------- Progress Note Details Patient Name: Bonnie Kane Date of Service: 07/08/2019 9:30 AM Medical Record Number: [id number] Patient Account Number: [id number] Date of Birth/Sex: 1945/09/29 (73 y.o. F) Treating RN: Army Melia Primary Care Provider: Shawnie Dapper Other Clinician: Referring Provider: Shawnie Dapper Treating Provider/Extender: Melburn Hake, Graviel Payeur Weeks in Treatment: 6 Subjective Chief Complaint Information obtained from Patient Left LE Blisters and Edema History of Present Illness (HPI) 05/24/19 patient presents today for initial evaluation here in our clinic as a referral from Dr. Julien Nordmann who is her family medicine practitioner. He has been taking care of this patient for several weeks now it appears. At least since it appears December 2020 although she has had some issue for quite some time. She has seen the vascular surgeon at [place] and did have ABIs and TBI's which were performed back at that point. That was in September 2020. With that being said unfortunately she did not show signs of low ABIs and TBI's on the left which is where the blistering is taking place at this point her right seem to be in good shape as far as the results were concerned. She had a right ABI of 1.17 with a TBI of 0.85 and a left ABI of 0.68 with a TBI of 0.43. This is at least moderate disease. The patient does have diabetes although apparently this is diet controlled according to what they tell me at this point. She does also have congestive heart failure, bilateral lower  extremity edema with a history of chronic venous stasis, systolic heart murmur, hypertension, and appears to have also had a transcatheter aortic valve replacement. Was on April 2018. The patient has been on doxycycline initially by Dr. Ernestine Conrad and then subsequently this was switched just yesterday to Cipro. He does note in his note yesterday that she had improved but he still recommended that she come to the wound care center for further evaluation and treatment. He did also on top given her the Cipro to cover the Serratia give her Vicodin for helping with the pain he also performed a basic metabolic panel and a CBC yesterday as well. He does have a history of having been recommended compression stockings in the past although apparently this ripped skin off when they remove them according to the patient and her husband. He is present during the evaluation today as well. Is also do CBC which actually appear to be doing fairly well the white blood cell count was 7.0 and appears normal. The metabolic panel did show an elevation in the BUN and creatinine as well as the ratio at this point her glucose was 231 not be fasting but still that is an elevation range. Right now the patient is taking 40 mg of Lasix in the morning and 20 mg in the evening. He is also get a recheck the metabolic panel in a week. He notes that her kidney function was just a little bit down compared to her baseline. His  most recent hemoglobin A1c was 7.2 on October 25, 2018. That was found in care everywhere from [place]. Patient's wound culture was actually performed just 4 days ago and again did show Serratia as the causative organism for the infection currently and obviously doxycycline is not to work for this that is why she was switched to Cipro. 05/31/2019 upon evaluation today patient appears to be doing well at this time with regard to her wounds on the left lower extremity. Several of the blisters that she  did have have also significantly improved. These have ruptured but do not appear to be spreading and a lot of the tissue though I think will come off I do not think we have to rush as far as this is concerned she is having a lot of discomfort still that really is disproportionate even to her wounds which seem to be improving. She did get the Farrow wrap although her husband states that she is not good to be able to wear the sock portion that goes underneath as it has something covering her toes she apparently is unable to wear anything that covers her toes. He states he is been married for close to 50 years and is never seen her where anything that even resembles a sock. 06/10/2019 upon evaluation today patient appears to be doing well with regard to her wounds at this point. She is still experiencing significant pain currently that I do believe is more neuropathic in nature I discussed this previous. Her wounds are healing quite nicely and to be honest her pain is disproportionate to the open wounds that she actually has present at this point she also does not appear to be showing any signs of infection. She does have numbness in her toes and then subsequently with regard to her wounds and even some areas that are not even open she has significant pain which I would describe is more neuropathic/nerve in nature. This was discussed with her and her husband yet again today. I did explain again today that I do not believe simply performing venous ablation is good to take care of this pain either. I did discuss possible options including since she has had issues in the past with other medications possibly trying Cymbalta her twin brother actually has a similar issue with neuropathy and neuropathic pain and takes Cymbalta with good result. I think that is at least a possibility for her to be honest. She could talk with her primary care provider about it we do not prescribe that medication at the wound center  just due to the nature of follow-up this necessary for that medicine. 06/18/2019 upon evaluation today patient appears to be doing well with regard to her wounds in general. I am extremely pleased with the progress that she is made and in fact her wounds seem to be healing quite nicely with compression. With that being said I do believe that she is going need something ongoing as far as compression is concerned. If she does not have ongoing compression then she will obviously revert back to where things were in the end. Nonetheless I do think at minimum she may need to have Tubigrip that she wears ongoing. I would discuss this in greater detail with her next week. For the time being we are more focused on trying to get her wounds healed and again she is definitely doing much better in that regard. Unfortunately I am told that when they change this that she has excruciating  pain for 8 hours I really not sure why she will be having this pain obviously there is no reason that the silver alginate really should be causing pain like this and again her pain again appears to be disproportionate to the wounds at this point especially considering there healing so well. For that reason I really think that this may be Morbid just changing the dressings in general. 06/24/2019 upon evaluation today patient appears to be doing very well with regard to her wounds. She is making some progress at all wound locations. With that being said I think she may do better switching to a collagen-based dressing also think that she would probably do better with regard to her wounds we were able to debride the wounds in order to clear off the slough and biofilm that I think is slowing down the healing process. The patient is in agreement with giving this a try after some discussion of how this could benefit her. She is in agreement with that. I therefore did proceed after obtaining consent with sharp debridement today. 07/01/2019 upon  evaluation today patient appears to be doing well with regard to her wounds. She is making some progress which is great news. Bonnie Kane, Bonnie Kane ([id number]) Fortunately there is no evidence of active infection at this time. With that being said the patient is still having significant pain which I believe is really unrelated to the wounds themselves she hurts even over the areas of her skin where there is no pain including her feet I believe this is all related more to neuropathy. She does not see a specialist for this currently she is on Cymbalta however. Unfortunately her wounds are not closed yet although they are better after cleaning things away last week. There still is some work to do this week as well as far as keeping the area free and clear. Fortunately there is no sign in general of active infection at this point. No fevers, chills, nausea, vomiting, or diarrhea. 07/08/2019 upon evaluation today patient appears to be doing better for the most part compared to previous evaluation. With that being said she does have some of the collagen/dressing material stuck to the wound beds of the left lower extremity. This fortunately was able to be removed by way of sharp debridement without damaging any of the good healing underneath at both locations. With that being said there fortunately is no signs of active infection at this time which is great news. No fever chills noted. The patient apparently has not been putting any moisture on the dressings before putting them on the leg area. Subsequently this is 1 reason why it probably dried out although I think the other is actually that to be honest the wounds are not draining nearly as much as they used to simply due to the fact that she is healing quite well. I am really not sure why they quit using any moisture on the dressings as far as saline was concerned other than they tell us that we told him not to but that was not the  case. Objective Constitutional Well-nourished and well-hydrated in no acute distress. Vitals Time Taken: 9:33 AM, Height: 64 in, Weight: 250 lbs, BMI: 42.9, Temperature: 97.8 F, Pulse: 82 bpm, Respiratory Rate: 16 breaths/min, Blood Pressure: 149/72 mmHg. Respiratory normal breathing without difficulty. Psychiatric this patient is able to make decisions and demonstrates good insight into disease process. Alert and Oriented x 3. pleasant and cooperative. General Notes: Upon evaluation today the patient seems to  be doing well with regard to her wounds once I was able to remove some of the dry dressing check she has a lot of healing underneath and I think the collagen is doing very well for her. She still is having pain on the left although there is really no pain on the top of her right foot. The left leg is mainly this locale for discomfort although to be honest I think the main issue here is neuropathy as the wounds are healing quite nicely. Integumentary (Hair, Skin) Wound #1 status is Open. Original cause of wound was Blister. The wound is located on the Left Lower Leg. The wound measures 0.1cm length x 0.1cm width x 0.1cm depth; 0.008cm^2 area and 0.001cm^3 volume. There is Fat Layer (Subcutaneous Tissue) Exposed exposed. There is no tunneling or undermining noted. There is a none present amount of drainage noted. The wound margin is flat and intact. There is no granulation within the wound bed. There is a large (67-100%) amount of necrotic tissue within the wound bed including Eschar. Wound #2 status is Open. Original cause of wound was Blister. The wound is located on the Left,Proximal Lower Leg. The wound measures 0.1cm length x 0.1cm width x 0.1cm depth; 0.008cm^2 area and 0.001cm^3 volume. There is Fat Layer (Subcutaneous Tissue) Exposed exposed. There is no tunneling or undermining noted. There is a none present amount of drainage noted. The wound margin is flat and intact. There is  no granulation within the wound bed. There is a large (67-100%) amount of necrotic tissue within the wound bed including Eschar. Wound #6 status is Open. Original cause of wound was Gradually Appeared. The wound is located on the Right,Dorsal Foot. The wound measures 0.2cm length x 0.2cm width x 0.1cm depth; 0.031cm^2 area and 0.003cm^3 volume. There is Fat Layer (Subcutaneous Tissue) Exposed exposed. There is no tunneling or undermining noted. There is a medium amount of drainage noted. The wound margin is flat and intact. There is small (1-33%) pink granulation within the wound bed. There is a large (67-100%) amount of necrotic tissue within the wound bed including Adherent Slough. Assessment Active Problems ICD-10 Venous insufficiency (chronic) (peripheral) Lymphedema, not elsewhere classified Other specified peripheral vascular diseases Bonnie Kane, Bonnie Kane ([id number]) Non-pressure chronic ulcer of other part of left lower leg limited to breakdown of skin Non-pressure chronic ulcer of other part of left foot limited to breakdown of skin Non-pressure chronic ulcer of other part of right foot with fat layer exposed Chronic combined systolic (congestive) and diastolic (congestive) heart failure Type 2 diabetes mellitus with foot ulcer Procedures Wound #1 Pre-procedure diagnosis of Wound #1 is a Trauma, Other located on the Left Lower Leg . There was a Excisional Skin/Subcutaneous Tissue Debridement with a total area of 0.09 sq cm performed by STONE III, Janann Boeve E., PA-C. With the following instrument(s): Curette to remove Viable and Non-Viable tissue/material. Material removed includes Subcutaneous Tissue and Slough and after achieving pain control using Lidocaine. A time out was conducted at 09:55, prior to the start of the procedure. There was no bleeding. The procedure was tolerated well. Post Debridement Measurements: 0.3cm length x 0.3cm width x 0.1cm depth; 0.007cm^3 volume. Character of  Wound/Ulcer Post Debridement is stable. Post procedure Diagnosis Wound #1: Same as Pre-Procedure Wound #2 Pre-procedure diagnosis of Wound #2 is a Trauma, Other located on the Left,Proximal Lower Leg . There was a Excisional Skin/Subcutaneous Tissue Debridement with a total area of 0.09 sq cm performed by STONE III, Puneet Masoner E., PA-C. With  the following instrument(s): Curette to remove Viable and Non-Viable tissue/material. Material removed includes Subcutaneous Tissue and Slough and after achieving pain control using Lidocaine. A time out was conducted at 09:55, prior to the start of the procedure. There was no bleeding. The procedure was tolerated well. Post Debridement Measurements: 0.3cm length x 0.3cm width x 0.1cm depth; 0.007cm^3 volume. Character of Wound/Ulcer Post Debridement is stable. Post procedure Diagnosis Wound #2: Same as Pre-Procedure Plan Wound Cleansing: Wound #1 Left Lower Leg: Clean wound with Normal Saline. Dial antibacterial soap, wash wounds, rinse and pat dry prior to dressing wounds May Shower, gently pat wound dry prior to applying new dressing. Wound #2 Left,Proximal Lower Leg: Clean wound with Normal Saline. Dial antibacterial soap, wash wounds, rinse and pat dry prior to dressing wounds May Shower, gently pat wound dry prior to applying new dressing. Wound #6 Right,Dorsal Foot: Clean wound with Normal Saline. Dial antibacterial soap, wash wounds, rinse and pat dry prior to dressing wounds May Shower, gently pat wound dry prior to applying new dressing. Skin Barriers/Peri-Wound Care: Wound #1 Left Lower Leg: Triamcinolone Acetonide Ointment (TCA) - and lotion Wound #2 Left,Proximal Lower Leg: Triamcinolone Acetonide Ointment (TCA) - and lotion Wound #6 Right,Dorsal Foot: Triamcinolone Acetonide Ointment (TCA) - and lotion Primary Wound Dressing: Wound #1 Left Lower Leg: Collagen - moisten with hydrogel Wound #2 Left,Proximal Lower Leg: Collagen - moisten  with hydrogel Wound #6 Right,Dorsal Foot: Collagen - moisten with hydrogel Secondary Dressing: Wound #1 Left Lower Leg: ABD and Kerlix/Conform Wound #2 Left,Proximal Lower Leg: Bonnie Kane, Bonnie Kane ([id number]) ABD and Kerlix/Conform Wound #6 Right,Dorsal Foot: ABD and Kerlix/Conform Dressing Change Frequency: Wound #1 Left Lower Leg: Change dressing every other day. Wound #2 Left,Proximal Lower Leg: Change dressing every other day. Wound #6 Right,Dorsal Foot: Change dressing every other day. Follow-up Appointments: Wound #1 Left Lower Leg: Return Appointment in 1 week. Wound #2 Left,Proximal Lower Leg: Return Appointment in 1 week. Wound #6 Right,Dorsal Foot: Return Appointment in 1 week. Edema Control: Wound #1 Left Lower Leg: Elevate legs to the level of the heart and pump ankles as often as possible Other: - TubiGrip F Wound #2 Left,Proximal Lower Leg: Elevate legs to the level of the heart and pump ankles as often as possible Other: - TubiGrip F Wound #6 Right,Dorsal Foot: Elevate legs to the level of the heart and pump ankles as often as possible Other: - TubiGrip F 1. Patient's wounds actually are showing signs of good improvement which is excellent news there does not appear to be any evidence of active infection which is also good news. I did have to remove some of the dried collagen on the surface of the wound today which the patient tolerated without complication. 2. I am get a recommend currently that we continue with the collagen but I do believe that she needs to use hydrogel to try to keep this area more moist. Again I think that skin to be the way to go as far as the overall healing is concerned otherwise things seem to be moving in the right direction. 3. We will continue with the Tubigrip as well that also seems to be doing well for her. We will see patient back for reevaluation in 1 week here in the clinic. If anything worsens or changes patient will contact our  office for additional recommendations. Electronic Signature(s) Signed: 07/08/2019 1:14:20 PM By: Worthy Keeler PA-C Entered By: Worthy Keeler on 07/08/2019 13:14:20 Bonnie Kane ([id number]) -------------------------------------------------------------------------------- SuperBill Details Patient Name:  Bonnie Kane Date of Service: 07/08/2019 Medical Record Number: [id number] Patient Account Number: [id number] Date of Birth/Sex: 1945-12-31 (74 y.o. F) Treating RN: Army Melia Primary Care Provider: Shawnie Dapper Other Clinician: Referring Provider: Shawnie Dapper Treating Provider/Extender: Melburn Hake, Jimya Ciani Weeks in Treatment: 6 Diagnosis Coding ICD-10 Codes Code Description I87.2 Venous insufficiency (chronic) (peripheral) I89.0 Lymphedema, not elsewhere classified I73.89 Other specified peripheral vascular diseases L97.821 Non-pressure chronic ulcer of other part of left lower leg limited to breakdown of skin L97.521 Non-pressure chronic ulcer of other part of left foot limited to breakdown of skin L97.512 Non-pressure chronic ulcer of other part of right foot with fat layer exposed I50.42 Chronic combined systolic (congestive) and diastolic (congestive) heart failure E11.621 Type 2 diabetes mellitus with foot ulcer Facility Procedures CPT4 Code: [id number] Description: [id number] - DEB SUBQ TISSUE 20 SQ CM/< Modifier: Quantity: 1 CPT4 Code: Description: ICD-10 Diagnosis Description L97.821 Non-pressure chronic ulcer of other part of left lower leg limited to breakdo Modifier: wn of skin Quantity: Physician Procedures CPT4 [id number] Description: 11042 - WC PHYS SUBQ TISS 20 SQ CM Modifier: Quantity: 1 CPT4 Code: Description: ICD-10 Diagnosis Description L97.821 Non-pressure chronic ulcer of other part of left lower leg limited to breakdo Modifier: wn of skin Quantity: Electronic Signature(s) Signed: 07/08/2019 1:16:41 PM By: Worthy Keeler PA-C Entered By: Worthy Keeler on 07/08/2019 13:16:40

## 2019-07-12 ENCOUNTER — Encounter: Payer: Self-pay | Admitting: Family Medicine

## 2019-07-12 ENCOUNTER — Other Ambulatory Visit: Payer: Self-pay

## 2019-07-12 ENCOUNTER — Ambulatory Visit (INDEPENDENT_AMBULATORY_CARE_PROVIDER_SITE_OTHER): Payer: Medicare Other | Admitting: Family Medicine

## 2019-07-12 VITALS — BP 147/72

## 2019-07-12 DIAGNOSIS — I890 Lymphedema, not elsewhere classified: Secondary | ICD-10-CM | POA: Diagnosis not present

## 2019-07-12 DIAGNOSIS — I872 Venous insufficiency (chronic) (peripheral): Secondary | ICD-10-CM

## 2019-07-12 MED ORDER — DULOXETINE HCL 30 MG PO CPEP: 30.0000 mg | ORAL_CAPSULE | Freq: Every day | ORAL | 3 refills | Status: DC

## 2019-07-12 NOTE — Progress Notes (Signed)
Virtual Visit via Video Note  I connected with pt on 07/12/19 at  9:00 AM EDT by a video enabled telemedicine application and verified that I am speaking with the correct person using two identifiers.  Location patient: home Location provider:work or home office Persons participating in the virtual visit: patient, provider  I discussed the limitations of evaluation and management by telemedicine and the availability of in person appointments. The patient expressed understanding and agreed to proceed.  Telemedicine visit is a necessity given the COVID-19 restrictions in place at the current time.  HPI: 74 y/o WF being see today for f/u lower extremity venous insufficiency edema/lymphedema, with recent history of edema-induced bullae (and subsequent ulcerations) on feet/ankles, some infection of these with resistant organism.  Has seen providers at wound clinic. Has also seen local vasc surgeon, Dr. Donzetta Matters. It was felt that her ulceration were from blistering only, not from art insuff.  Has signif reflux in larger greater saphenous vein on left.  Compression stockings recommended and f/u 3 mo recommended--possible discussion of saphenous vein ablation.  Interim hx: Feeling good. Blisters on feet healing well now. Cymbalta helping with periph neuropathic pain. All antibiotics finished. Hopefully her last f/u with wound center will be in 3d.  Husband changing bandages a few times a week. Compliant with lasix 40mg  bid. Compression stockings--has them but hasn't started yet b/c legs not completely healed yet. She is elevating legs better, not sleeping in a recliner anymore---edema signif better.   ROS: no fevers, no CP, no SOB, no wheezing, no cough, no dizziness, no HAs, no rashes, no melena/hematochezia.  No polyuria or polydipsia.  No myalgias or arthralgias.  No focal weakness, paresthesias, or tremors.  No acute vision or hearing abnormalities. No n/v/d or abd pain.  No palpitations.      Past Medical History:  Diagnosis Date  . CAD (coronary artery disease) 01/2019   Noted on CT angio chest during the time of her covid illness.  EKG with evidence of inferior and inferolateral MIs.  . Chronic combined systolic and diastolic CHF (congestive heart failure) ([place]) 01/2019  . COVID-19 virus infection 01/2019   Covid test + 01/26/19-->acute hypox RF ([facility]).  . Diabetes mellitus    Type 2  . Edema of both lower extremities due to peripheral venous insufficiency   . Heart murmur, systolic [id number]   Never could get pt to get ECHO arranged in her hometown [place], [place]  Finally, when she was dx'd with breast cancer 06/2016, she had to get echo and it showed severe AS and she subsequently got TAVR.  Marland Kitchen Hepatic steatosis 2020  . History of left breast cancer 06/17/2016   Invasive ductal carcinoma-->Left mastectomy with axillary lymph node dissection, +radiation,+chemo (all done in [place], [place]  Marland Kitchen HTN (hypertension), benign   . Hx of total knee replacement, bilateral 2018  . Hypertriglyceridemia   . Obesity   . Osteoarthritis    low back, hips, knees  . S/P TAVR (transcatheter aortic valve replacement) 07/2016   [facility]  . Transaminasemia mild 4/11   hep panel- neg  . Venous reflux 06/2019   L saphenous    Past Surgical History:  Procedure Laterality Date  . APPENDECTOMY  1986  . LE arterial dopplers  06/2019   by vascular MD---normal in LE's  . MASTECTOMY WITH AXILLARY LYMPH NODE DISSECTION  06/2016   [place], [place]  . REPLACEMENT TOTAL KNEE BILATERAL  2018/19   [place], [place]  . TOTAL ABDOMINAL HYSTERECTOMY  1986  . TRANSCATHETER AORTIC VALVE REPLACEMENT, TRANSFEMORAL  07/2016   [facility].  (Dr. Aline Brochure?)  Pt states she got f/u echo approx 01/2018.  Marland Kitchen TRANSTHORACIC ECHOCARDIOGRAM  01/2019   during covid illness->EF 40-45%, DD, global hypokinesis, prosthetic AV OK.    Family History  Problem Relation Age of Onset  . Other Brother        Dysrythmia   . Other Mother        CHF  . Diabetes Mother        Type 2  . Other Father        CHF  . Celiac disease Sister   . Alcohol abuse Brother   . Multiple sclerosis Brother      Current Outpatient Medications:  .  acetaminophen (TYLENOL) 325 MG tablet, Take by mouth., Disp: , Rfl:  .  ASPIRIN 81 PO, Take 81 mg by mouth., Disp: , Rfl:  .  DULoxetine (CYMBALTA) 30 MG capsule, Take 1 capsule (30 mg total) by mouth daily., Disp: 30 capsule, Rfl: 0 .  furosemide (LASIX) 40 MG tablet, 1 tab po bid, Disp: 180 tablet, Rfl: 1 .  HYDROcodone-acetaminophen (NORCO/VICODIN) 5-325 MG tablet, Take 1-2 tablets by mouth every 6 (six) hours as needed for moderate pain., Disp: 30 tablet, Rfl: 0 .  vitamin E 400 UNIT capsule, Take by mouth daily., Disp: , Rfl:  .  zolpidem (AMBIEN) 10 MG tablet, TAKE 1 TABLET BY MOUTH ONCE DAILY AT BEDTIME AS NEEDED FOR SLEEP, Disp: 90 tablet, Rfl: 1  EXAM:  VITALS per patient if applicable: There were no vitals taken for this visit.   GENERAL: alert, oriented, appears well and in no acute distress  HEENT: atraumatic, conjunttiva clear, no obvious abnormalities on inspection of external nose and ears  NECK: normal movements of the head and neck  LUNGS: on inspection no signs of respiratory distress, breathing rate appears normal, no obvious gross SOB, gasping or wheezing  CV: no obvious cyanosis  MS: moves all visible extremities without noticeable abnormality  PSYCH/NEURO: pleasant and cooperative, no obvious depression or anxiety, speech and thought processing grossly intact  LABS: none today  06/28/19 Vas [place] lower ext venous reflux: Summary:  Left:  - No evidence of deep vein thrombosis seen in the left lower extremity,  based on limited visualization.   - Subacute thrombus observed in the great saphenous vein at the level of  the knee/proximal calf.  - The deep veins are incompetent.  - The great saphenous vein is incompetent.  - The small saphenous  vein is competent.     Chemistry      Component Value Date/Time   NA 143 06/05/2019 1043   NA 137 01/31/2019 0000   K 4.6 06/05/2019 1043   CL 105 06/05/2019 1043   CO2 30 06/05/2019 1043   BUN 27 (H) 06/05/2019 1043   BUN 25 (A) 01/31/2019 0000   CREATININE 1.12 06/05/2019 1043   CREATININE 1.13 (H) 05/23/2019 1409   GLU 166 01/31/2019 0000      Component Value Date/Time   CALCIUM 9.0 06/05/2019 1043   ALKPHOS 70 03/28/2019 1553   AST 25 03/28/2019 1553   ALT 21 03/28/2019 1553   BILITOT 0.6 03/28/2019 1553     Lab Results  Component Value Date   WBC 7.0 05/23/2019   HGB 12.9 05/23/2019   HCT 40.9 05/23/2019   MCV 86.5 05/23/2019   PLT 178 05/23/2019   ProBNP    Component Value Date/Time  PROBNP 296.0 (H) 04/05/2019 0758   ASSESSMENT AND PLAN:  Discussed the following assessment and plan:  1) Lower ext venous insufficiency and lymphedema, with infected bullae/superficial ulcerations. Doing much better, about to finish up with wound care clinic--appreciate their expertise very much! Elevating legs better, limiting Na more, will start compression stockings when feet are completely w/out ulcer or blister.  Continue lasix 40mg  bid. Does not need anymore vicodin at this time.  2) Insomnia: takes ambien nightly and it helps. She will request rf when appropriate.   I discussed the assessment and treatment plan with the patient. The patient was provided an opportunity to ask questions and all were answered. The patient agreed with the plan and demonstrated an understanding of the instructions.  The patient was advised to call back or seek an in-person evaluation if the symptoms worsen or if the condition fails to improve as anticipated.  F/u: 4 mo  Signed:  Crissie Sickles, MD           07/12/2019

## 2019-07-15 ENCOUNTER — Encounter: Payer: Medicare Other | Admitting: Physician Assistant

## 2019-07-15 ENCOUNTER — Other Ambulatory Visit: Payer: Self-pay

## 2019-07-15 DIAGNOSIS — I110 Hypertensive heart disease with heart failure: Secondary | ICD-10-CM | POA: Diagnosis not present

## 2019-07-15 DIAGNOSIS — L97521 Non-pressure chronic ulcer of other part of left foot limited to breakdown of skin: Secondary | ICD-10-CM | POA: Diagnosis not present

## 2019-07-15 DIAGNOSIS — L97821 Non-pressure chronic ulcer of other part of left lower leg limited to breakdown of skin: Secondary | ICD-10-CM | POA: Diagnosis not present

## 2019-07-15 DIAGNOSIS — L97222 Non-pressure chronic ulcer of left calf with fat layer exposed: Secondary | ICD-10-CM | POA: Diagnosis not present

## 2019-07-15 DIAGNOSIS — L97512 Non-pressure chronic ulcer of other part of right foot with fat layer exposed: Secondary | ICD-10-CM | POA: Diagnosis not present

## 2019-07-15 DIAGNOSIS — E11621 Type 2 diabetes mellitus with foot ulcer: Secondary | ICD-10-CM | POA: Diagnosis not present

## 2019-07-15 DIAGNOSIS — E1151 Type 2 diabetes mellitus with diabetic peripheral angiopathy without gangrene: Secondary | ICD-10-CM | POA: Diagnosis not present

## 2019-07-15 NOTE — Progress Notes (Addendum)
TAIJA, COPPAGE ([id number]) Visit Report for 07/15/2019 Chief Complaint Document Details Patient Name: Bonnie Kane, Bonnie Kane Date of Service: 07/15/2019 9:30 AM Medical Record Number: [id number] Patient Account Number: [id number] Date of Birth/Sex: 10-Aug-1945 (74 y.o. F) Treating RN: Army Melia Primary Care Provider: Shawnie Dapper Other Clinician: Referring Provider: Shawnie Dapper Treating Provider/Extender: Melburn Hake, Boone Gear Weeks in Treatment: 7 Information Obtained from: Patient Chief Complaint Left LE Blisters and Edema Electronic Signature(s) Signed: 07/15/2019 9:32:44 AM By: Worthy Keeler PA-C Entered By: Worthy Keeler on 07/15/2019 09:32:44 Bonnie Kane ([id number]) -------------------------------------------------------------------------------- Debridement Details Patient Name: Bonnie Kane Date of Service: 07/15/2019 9:30 AM Medical Record Number: [id number] Patient Account Number: [id number] Date of Birth/Sex: [birth year] (74 y.o. F) Treating RN: Army Melia Primary Care Provider: Shawnie Dapper Other Clinician: Referring Provider: Shawnie Dapper Treating Provider/Extender: Melburn Hake, Labib Cwynar Weeks in Treatment: 7 Debridement Performed for Wound #2 Left,Proximal Lower Leg Assessment: Performed By: Physician STONE III, Deni Lefever E., PA-C Debridement Type: Debridement Level of Consciousness (Pre- Awake and Alert procedure): Pre-procedure Verification/Time Out Yes - 09:54 Taken: Start Time: 09:55 Pain Control: Lidocaine Total Area Debrided (L x W): 0.1 (cm) x 0.1 (cm) = 0.01 (cm) Tissue and other material debrided: Viable, Non-Viable, Subcutaneous, Skin: Epidermis Level: Skin/Subcutaneous Tissue Debridement Description: Excisional Instrument: Curette Bleeding: None End Time: 09:56 Response to Treatment: Procedure was tolerated well Level of Consciousness (Post- Awake and Alert procedure): Post Debridement Measurements of Total Wound Length: (cm) 0.1 Width: (cm) 0.1 Depth:  (cm) 0.1 Volume: (cm) 0.001 Character of Wound/Ulcer Post Debridement: Stable Post Procedure Diagnosis Same as Pre-procedure Electronic Signature(s) Signed: 07/15/2019 11:52:58 AM By: Army Melia Signed: 07/18/2019 5:29:46 PM By: Worthy Keeler PA-C Entered By: Army Melia on 07/15/2019 09:55:53 Bonnie Kane ([id number]) -------------------------------------------------------------------------------- HPI Details Patient Name: Bonnie Kane Date of Service: 07/15/2019 9:30 AM Medical Record Number: [id number] Patient Account Number: [id number] Date of Birth/Sex: Nov 07, 1945 (74 y.o. F) Treating RN: Army Melia Primary Care Provider: Shawnie Dapper Other Clinician: Referring Provider: Shawnie Dapper Treating Provider/Extender: Melburn Hake, Danille Oppedisano Weeks in Treatment: 7 History of Present Illness HPI Description: 05/24/19 patient presents today for initial evaluation here in our clinic as a referral from Dr. Julien Nordmann who is her family medicine practitioner. He has been taking care of this patient for several weeks now it appears. At least since it appears December 2020 although she has had some issue for quite some time. She has seen the vascular surgeon at [place] and did have ABIs and TBI's which were performed back at that point. That was in September 2020. With that being said unfortunately she did not show signs of low ABIs and TBI's on the left which is where the blistering is taking place at this point her right seem to be in good shape as far as the results were concerned. She had a right ABI of 1.17 with a TBI of 0.85 and a left ABI of 0.68 with a TBI of 0.43. This is at least moderate disease. The patient does have diabetes although apparently this is diet controlled according to what they tell me at this point. She does also have congestive heart failure, bilateral lower extremity edema with a history of chronic venous stasis, systolic heart murmur,  hypertension, and appears to have also had a transcatheter aortic valve replacement. Was on April 2018. The patient has been on doxycycline initially by Dr. Ernestine Conrad and then subsequently this was switched just yesterday to Cipro. He does note in his note yesterday that she had  improved but he still recommended that she come to the wound care center for further evaluation and treatment. He did also on top given her the Cipro to cover the Serratia give her Vicodin for helping with the pain he also performed a basic metabolic panel and a CBC yesterday as well. He does have a history of having been recommended compression stockings in the past although apparently this ripped skin off when they remove them according to the patient and her husband. He is present during the evaluation today as well. Is also do CBC which actually appear to be doing fairly well the white blood cell count was 7.0 and appears normal. The metabolic panel did show an elevation in the BUN and creatinine as well as the ratio at this point her glucose was 231 not be fasting but still that is an elevation range. Right now the patient is taking 40 mg of Lasix in the morning and 20 mg in the evening. He is also get a recheck the metabolic panel in a week. He notes that her kidney function was just a little bit down compared to her baseline. His most recent hemoglobin A1c was 7.2 on October 25, 2018. That was found in care everywhere from [place]. Patient's wound culture was actually performed just 4 days ago and again did show Serratia as the causative organism for the infection currently and obviously doxycycline is not to work for this that is why she was switched to Cipro. 05/31/2019 upon evaluation today patient appears to be doing well at this time with regard to her wounds on the left lower extremity. Several of the blisters that she did have have also significantly improved. These have ruptured but do not appear  to be spreading and a lot of the tissue though I think will come off I do not think we have to rush as far as this is concerned she is having a lot of discomfort still that really is disproportionate even to her wounds which seem to be improving. She did get the Farrow wrap although her husband states that she is not good to be able to wear the sock portion that goes underneath as it has something covering her toes she apparently is unable to wear anything that covers her toes. He states he is been married for close to 50 years and is never seen her where anything that even resembles a sock. 06/10/2019 upon evaluation today patient appears to be doing well with regard to her wounds at this point. She is still experiencing significant pain currently that I do believe is more neuropathic in nature I discussed this previous. Her wounds are healing quite nicely and to be honest her pain is disproportionate to the open wounds that she actually has present at this point she also does not appear to be showing any signs of infection. She does have numbness in her toes and then subsequently with regard to her wounds and even some areas that are not even open she has significant pain which I would describe is more neuropathic/nerve in nature. This was discussed with her and her husband yet again today. I did explain again today that I do not believe simply performing venous ablation is good to take care of this pain either. I did discuss possible options including since she has had issues in the past with other medications possibly trying Cymbalta her twin brother actually has a similar issue with neuropathy and neuropathic pain and takes Cymbalta  with good result. I think that is at least a possibility for her to be honest. She could talk with her primary care provider about it we do not prescribe that medication at the wound center just due to the nature of follow-up this necessary for that medicine. 06/18/2019  upon evaluation today patient appears to be doing well with regard to her wounds in general. I am extremely pleased with the progress that she is made and in fact her wounds seem to be healing quite nicely with compression. With that being said I do believe that she is going need something ongoing as far as compression is concerned. If she does not have ongoing compression then she will obviously revert back to where things were in the end. Nonetheless I do think at minimum she may need to have Tubigrip that she wears ongoing. I would discuss this in greater detail with her next week. For the time being we are more focused on trying to get her wounds healed and again she is definitely doing much better in that regard. Unfortunately I am told that when they change this that she has excruciating pain for 8 hours I really not sure why she will be having this pain obviously there is no reason that the silver alginate really should be causing pain like this and again her pain again appears to be disproportionate to the wounds at this point especially considering there healing so well. For that reason I really think that this may be Morbid just changing the dressings in general. 06/24/2019 upon evaluation today patient appears to be doing very well with regard to her wounds. She is making some progress at all wound locations. With that being said I think she may do better switching to a collagen-based dressing also think that she would probably do better with regard to her wounds we were able to debride the wounds in order to clear off the slough and biofilm that I think is slowing down the healing process. The patient is in agreement with giving this a try after some discussion of how this could benefit her. She is in agreement with that. I therefore did proceed after obtaining consent with sharp debridement today. 07/01/2019 upon evaluation today patient appears to be doing well with regard to her wounds. She  is making some progress which is great news. Fortunately there is no evidence of active infection at this time. With that being said the patient is still having significant pain which I believe is really unrelated to the wounds themselves she hurts even over the areas of her skin where there is no pain including her feet I believe this is all related more to neuropathy. She does not see a specialist for this currently she is on Cymbalta however. Unfortunately her wounds are not closed yet although they are better after cleaning things away last week. There still is some work to do this week as well as far as keeping the area free and clear. Fortunately there is no sign in general of active infection at this point. No fevers, chills, nausea, vomiting, or diarrhea. Bonnie Kane, Bonnie Kane ([id number]) 07/08/2019 upon evaluation today patient appears to be doing better for the most part compared to previous evaluation. With that being said she does have some of the collagen/dressing material stuck to the wound beds of the left lower extremity. This fortunately was able to be removed by way of sharp debridement without damaging any of the good healing underneath at both locations. With  that being said there fortunately is no signs of active infection at this time which is great news. No fever chills noted. The patient apparently has not been putting any moisture on the dressings before putting them on the leg area. Subsequently this is 1 reason why it probably dried out although I think the other is actually that to be honest the wounds are not draining nearly as much as they used to simply due to the fact that she is healing quite well. I am really not sure why they quit using any moisture on the dressings as far as saline was concerned other than they tell us that we told him not to but that was not the case. 07/15/2019 upon evaluation today patient actually appears to be doing excellent with regard to her wounds. She  has been tolerating the dressing changes actually quite well without complication will be using collagen the only issue here is that she continues to build up collagen over the wounds where this is getting somewhat dry. I think this is the fact related to the fact that she is actually healing and therefore having much less drainage which is keeping the collagen from becoming as moist and dissolving as we would like to have seen. Electronic Signature(s) Signed: 07/15/2019 4:59:12 PM By: Worthy Keeler PA-C Entered By: Worthy Keeler on 07/15/2019 16:59:11 Bonnie Kane, Bonnie Kane ([id number]) -------------------------------------------------------------------------------- Physical Exam Details Patient Name: Bonnie Kane Date of Service: 07/15/2019 9:30 AM Medical Record Number: [id number] Patient Account Number: [id number] Date of Birth/Sex: 10-09-1945 (73 y.o. F) Treating RN: Army Melia Primary Care Provider: Shawnie Dapper Other Clinician: Referring Provider: Shawnie Dapper Treating Provider/Extender: STONE III, Carrina Schoenberger Weeks in Treatment: 7 Constitutional Well-nourished and well-hydrated in no acute distress. Respiratory normal breathing without difficulty. Psychiatric this patient is able to make decisions and demonstrates good insight into disease process. Alert and Oriented x 3. pleasant and cooperative. Notes Upon inspection I did actually have to perform some sharp debridement to clear away some of the dressing material as well as dry skin from the surface of the patient's wounds. This was performed on the left leg today. The patient tolerated this without complication. With that being said overall I feel like she is doing quite well and is almost completely healed in regard to both of the remaining wounds which is excellent news. The patient's husband does seem to be somewhat frustrated with the amount of time that is taking to get everything healed again we have been seeing her for 7 weeks at  this point. With that being said I understand completely this has been going on for quite some time prior. Electronic Signature(s) Signed: 07/15/2019 5:00:21 PM By: Worthy Keeler PA-C Entered By: Worthy Keeler on 07/15/2019 17:00:21 Bonnie Kane ([id number]) -------------------------------------------------------------------------------- Physician Orders Details Patient Name: Bonnie Kane Date of Service: 07/15/2019 9:30 AM Medical Record Number: [id number] Patient Account Number: [id number] Date of Birth/Sex: 1945/11/22 (73 y.o. F) Treating RN: Army Melia Primary Care Provider: Shawnie Dapper Other Clinician: Referring Provider: Shawnie Dapper Treating Provider/Extender: Melburn Hake, Syaire Saber Weeks in Treatment: 7 Verbal / Phone Orders: No Diagnosis Coding ICD-10 Coding Code Description I87.2 Venous insufficiency (chronic) (peripheral) I89.0 Lymphedema, not elsewhere classified I73.89 Other specified peripheral vascular diseases L97.821 Non-pressure chronic ulcer of other part of left lower leg limited to breakdown of skin L97.521 Non-pressure chronic ulcer of other part of left foot limited to breakdown of skin L97.512 Non-pressure chronic ulcer of other part of right foot with fat layer exposed  I50.42 Chronic combined systolic (congestive) and diastolic (congestive) heart failure E11.621 Type 2 diabetes mellitus with foot ulcer Wound Cleansing Wound #2 Left,Proximal Lower Leg o Clean wound with Normal Saline. o Dial antibacterial soap, wash wounds, rinse and pat dry prior to dressing wounds o May Shower, gently pat wound dry prior to applying new dressing. Wound #6 Right,Dorsal Foot o Clean wound with Normal Saline. o Dial antibacterial soap, wash wounds, rinse and pat dry prior to dressing wounds o May Shower, gently pat wound dry prior to applying new dressing. Skin Barriers/Peri-Wound Care Wound #2 Left,Proximal Lower Leg o Triamcinolone Acetonide Ointment (TCA)  - and lotion Wound #6 Right,Dorsal Foot o Triamcinolone Acetonide Ointment (TCA) - and lotion Primary Wound Dressing Wound #2 Left,Proximal Lower Leg o Xeroform Wound #6 Right,Dorsal Foot o Xeroform Secondary Dressing Wound #2 Left,Proximal Lower Leg o ABD and Kerlix/Conform Wound #6 Right,Dorsal Foot o ABD and Kerlix/Conform Dressing Change Frequency Wound #2 Left,Proximal Lower Leg o Change dressing every other day. Wound #6 Right,Dorsal Foot o Change dressing every other day. Bonnie Kane, Bonnie Kane ([id number]) Follow-up Appointments Wound #2 Left,Proximal Lower Leg o Return Appointment in 1 week. Wound #6 Right,Dorsal Foot o Return Appointment in 1 week. Edema Control Wound #2 Left,Proximal Lower Leg o Elevate legs to the level of the heart and pump ankles as often as possible o Other: - TubiGrip F Wound #6 Right,Dorsal Foot o Elevate legs to the level of the heart and pump ankles as often as possible o Other: - TubiGrip F Electronic Signature(s) Signed: 07/15/2019 11:52:58 AM By: Army Melia Signed: 07/18/2019 5:29:46 PM By: Worthy Keeler PA-C Entered By: Army Melia on 07/15/2019 09:58:46 Bonnie Kane ([id number]) -------------------------------------------------------------------------------- Problem List Details Patient Name: Bonnie Kane Date of Service: 07/15/2019 9:30 AM Medical Record Number: [id number] Patient Account Number: [id number] Date of Birth/Sex: 06-14-1945 (73 y.o. F) Treating RN: Army Melia Primary Care Provider: Shawnie Dapper Other Clinician: Referring Provider: Shawnie Dapper Treating Provider/Extender: Melburn Hake, Cashton Hosley Weeks in Treatment: 7 Active Problems ICD-10 Evaluated Encounter Code Description Active Date Today Diagnosis I87.2 Venous insufficiency (chronic) (peripheral) 05/24/2019 No Yes I89.0 Lymphedema, not elsewhere classified 05/24/2019 No Yes I73.89 Other specified peripheral vascular diseases 05/24/2019 No  Yes L97.821 Non-pressure chronic ulcer of other part of left lower leg limited to 05/24/2019 No Yes breakdown of skin L97.521 Non-pressure chronic ulcer of other part of left foot limited to breakdown of 05/24/2019 No Yes skin L97.512 Non-pressure chronic ulcer of other part of right foot with fat layer exposed 06/10/2019 No Yes I50.42 Chronic combined systolic (congestive) and diastolic (congestive) heart 05/24/2019 No Yes failure E11.621 Type 2 diabetes mellitus with foot ulcer 05/24/2019 No Yes Inactive Problems Resolved Problems Electronic Signature(s) Signed: 07/15/2019 9:31:59 AM By: Worthy Keeler PA-C Entered By: Worthy Keeler on 07/15/2019 09:31:59 Bonnie Kane ([id number]) -------------------------------------------------------------------------------- Progress Note Details Patient Name: Bonnie Kane Date of Service: 07/15/2019 9:30 AM Medical Record Number: [id number] Patient Account Number: [id number] Date of Birth/Sex: 03/07/1946 (73 y.o. F) Treating RN: Army Melia Primary Care Provider: Shawnie Dapper Other Clinician: Referring Provider: Shawnie Dapper Treating Provider/Extender: Melburn Hake, Abbygael Curtiss Weeks in Treatment: 7 Subjective Chief Complaint Information obtained from Patient Left LE Blisters and Edema History of Present Illness (HPI) 05/24/19 patient presents today for initial evaluation here in our clinic as a referral from Dr. Julien Nordmann who is her family medicine practitioner. He has been taking care of this patient for several weeks now it appears. At least since it appears December 2020 although she has  had some issue for quite some time. She has seen the vascular surgeon at [place] and did have ABIs and TBI's which were performed back at that point. That was in September 2020. With that being said unfortunately she did not show signs of low ABIs and TBI's on the left which is where the blistering is taking place at this point her right seem to  be in good shape as far as the results were concerned. She had a right ABI of 1.17 with a TBI of 0.85 and a left ABI of 0.68 with a TBI of 0.43. This is at least moderate disease. The patient does have diabetes although apparently this is diet controlled according to what they tell me at this point. She does also have congestive heart failure, bilateral lower extremity edema with a history of chronic venous stasis, systolic heart murmur, hypertension, and appears to have also had a transcatheter aortic valve replacement. Was on April 2018. The patient has been on doxycycline initially by Dr. Ernestine Conrad and then subsequently this was switched just yesterday to Cipro. He does note in his note yesterday that she had improved but he still recommended that she come to the wound care center for further evaluation and treatment. He did also on top given her the Cipro to cover the Serratia give her Vicodin for helping with the pain he also performed a basic metabolic panel and a CBC yesterday as well. He does have a history of having been recommended compression stockings in the past although apparently this ripped skin off when they remove them according to the patient and her husband. He is present during the evaluation today as well. Is also do CBC which actually appear to be doing fairly well the white blood cell count was 7.0 and appears normal. The metabolic panel did show an elevation in the BUN and creatinine as well as the ratio at this point her glucose was 231 not be fasting but still that is an elevation range. Right now the patient is taking 40 mg of Lasix in the morning and 20 mg in the evening. He is also get a recheck the metabolic panel in a week. He notes that her kidney function was just a little bit down compared to her baseline. His most recent hemoglobin A1c was 7.2 on October 25, 2018. That was found in care everywhere from [place]. Patient's wound culture was actually  performed just 4 days ago and again did show Serratia as the causative organism for the infection currently and obviously doxycycline is not to work for this that is why she was switched to Cipro. 05/31/2019 upon evaluation today patient appears to be doing well at this time with regard to her wounds on the left lower extremity. Several of the blisters that she did have have also significantly improved. These have ruptured but do not appear to be spreading and a lot of the tissue though I think will come off I do not think we have to rush as far as this is concerned she is having a lot of discomfort still that really is disproportionate even to her wounds which seem to be improving. She did get the Farrow wrap although her husband states that she is not good to be able to wear the sock portion that goes underneath as it has something covering her toes she apparently is unable to wear anything that covers her toes. He states he is been married for close to  50 years and is never seen her where anything that even resembles a sock. 06/10/2019 upon evaluation today patient appears to be doing well with regard to her wounds at this point. She is still experiencing significant pain currently that I do believe is more neuropathic in nature I discussed this previous. Her wounds are healing quite nicely and to be honest her pain is disproportionate to the open wounds that she actually has present at this point she also does not appear to be showing any signs of infection. She does have numbness in her toes and then subsequently with regard to her wounds and even some areas that are not even open she has significant pain which I would describe is more neuropathic/nerve in nature. This was discussed with her and her husband yet again today. I did explain again today that I do not believe simply performing venous ablation is good to take care of this pain either. I did discuss possible options including since  she has had issues in the past with other medications possibly trying Cymbalta her twin brother actually has a similar issue with neuropathy and neuropathic pain and takes Cymbalta with good result. I think that is at least a possibility for her to be honest. She could talk with her primary care provider about it we do not prescribe that medication at the wound center just due to the nature of follow-up this necessary for that medicine. 06/18/2019 upon evaluation today patient appears to be doing well with regard to her wounds in general. I am extremely pleased with the progress that she is made and in fact her wounds seem to be healing quite nicely with compression. With that being said I do believe that she is going need something ongoing as far as compression is concerned. If she does not have ongoing compression then she will obviously revert back to where things were in the end. Nonetheless I do think at minimum she may need to have Tubigrip that she wears ongoing. I would discuss this in greater detail with her next week. For the time being we are more focused on trying to get her wounds healed and again she is definitely doing much better in that regard. Unfortunately I am told that when they change this that she has excruciating pain for 8 hours I really not sure why she will be having this pain obviously there is no reason that the silver alginate really should be causing pain like this and again her pain again appears to be disproportionate to the wounds at this point especially considering there healing so well. For that reason I really think that this may be Morbid just changing the dressings in general. 06/24/2019 upon evaluation today patient appears to be doing very well with regard to her wounds. She is making some progress at all wound locations. With that being said I think she may do better switching to a collagen-based dressing also think that she would probably do better with regard to  her wounds we were able to debride the wounds in order to clear off the slough and biofilm that I think is slowing down the healing process. The patient is in agreement with giving this a try after some discussion of how this could benefit her. She is in agreement with that. I therefore did proceed after obtaining consent with sharp debridement today. 07/01/2019 upon evaluation today patient appears to be doing well with regard to her wounds. She is making some progress which is great  news. Bonnie Kane, Bonnie Kane ([id number]) Fortunately there is no evidence of active infection at this time. With that being said the patient is still having significant pain which I believe is really unrelated to the wounds themselves she hurts even over the areas of her skin where there is no pain including her feet I believe this is all related more to neuropathy. She does not see a specialist for this currently she is on Cymbalta however. Unfortunately her wounds are not closed yet although they are better after cleaning things away last week. There still is some work to do this week as well as far as keeping the area free and clear. Fortunately there is no sign in general of active infection at this point. No fevers, chills, nausea, vomiting, or diarrhea. 07/08/2019 upon evaluation today patient appears to be doing better for the most part compared to previous evaluation. With that being said she does have some of the collagen/dressing material stuck to the wound beds of the left lower extremity. This fortunately was able to be removed by way of sharp debridement without damaging any of the good healing underneath at both locations. With that being said there fortunately is no signs of active infection at this time which is great news. No fever chills noted. The patient apparently has not been putting any moisture on the dressings before putting them on the leg area. Subsequently this is 1 reason why it probably dried out  although I think the other is actually that to be honest the wounds are not draining nearly as much as they used to simply due to the fact that she is healing quite well. I am really not sure why they quit using any moisture on the dressings as far as saline was concerned other than they tell us that we told him not to but that was not the case. 07/15/2019 upon evaluation today patient actually appears to be doing excellent with regard to her wounds. She has been tolerating the dressing changes actually quite well without complication will be using collagen the only issue here is that she continues to build up collagen over the wounds where this is getting somewhat dry. I think this is the fact related to the fact that she is actually healing and therefore having much less drainage which is keeping the collagen from becoming as moist and dissolving as we would like to have seen. Objective Constitutional Well-nourished and well-hydrated in no acute distress. Vitals Time Taken: 9:35 AM, Height: 64 in, Weight: 250 lbs, BMI: 42.9, Temperature: 98.4 F, Pulse: 88 bpm, Respiratory Rate: 18 breaths/min, Blood Pressure: 148/72 mmHg. Respiratory normal breathing without difficulty. Psychiatric this patient is able to make decisions and demonstrates good insight into disease process. Alert and Oriented x 3. pleasant and cooperative. General Notes: Upon inspection I did actually have to perform some sharp debridement to clear away some of the dressing material as well as dry skin from the surface of the patient's wounds. This was performed on the left leg today. The patient tolerated this without complication. With that being said overall I feel like she is doing quite well and is almost completely healed in regard to both of the remaining wounds which is excellent news. The patient's husband does seem to be somewhat frustrated with the amount of time that is taking to get everything healed again we have  been seeing her for 7 weeks at this point. With that being said I understand completely this has been going on for  quite some time prior. Integumentary (Hair, Skin) Wound #1 status is Healed - Epithelialized. Original cause of wound was Blister. The wound is located on the Left Lower Leg. The wound measures 0cm length x 0cm width x 0cm depth; 0cm^2 area and 0cm^3 volume. There is Fat Layer (Subcutaneous Tissue) Exposed exposed. There is no tunneling or undermining noted. There is a none present amount of drainage noted. The wound margin is flat and intact. There is no granulation within the wound bed. There is a large (67-100%) amount of necrotic tissue within the wound bed including Eschar. Wound #2 status is Open. Original cause of wound was Blister. The wound is located on the Left,Proximal Lower Leg. The wound measures 0.1cm length x 0.1cm width x 0.1cm depth; 0.008cm^2 area and 0.001cm^3 volume. There is Fat Layer (Subcutaneous Tissue) Exposed exposed. There is no tunneling or undermining noted. There is a none present amount of drainage noted. The wound margin is flat and intact. There is no granulation within the wound bed. There is a large (67-100%) amount of necrotic tissue within the wound bed including Eschar. Wound #6 status is Open. Original cause of wound was Gradually Appeared. The wound is located on the Right,Dorsal Foot. The wound measures 0.2cm length x 0.2cm width x 0.1cm depth; 0.031cm^2 area and 0.003cm^3 volume. There is Fat Layer (Subcutaneous Tissue) Exposed exposed. There is no tunneling or undermining noted. There is a none present amount of drainage noted. The wound margin is flat and intact. There is no granulation within the wound bed. There is a large (67-100%) amount of necrotic tissue within the wound bed including Eschar. 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 There was a Excisional Skin/Subcutaneous Tissue Debridement with a total area of 0.01 sq cm performed by STONE III, Alleene Stoy E., PA-C. With the following instrument(s): Curette to remove Viable and Non-Viable tissue/material. Material removed includes Subcutaneous Tissue and Skin: Epidermis and after achieving pain control using Lidocaine. A time out was conducted at 09:54, prior to the start of the procedure. There was no bleeding. The procedure was tolerated well. Post Debridement Measurements: 0.1cm length x 0.1cm width x 0.1cm depth; 0.001cm^3 volume. Character of Wound/Ulcer Post Debridement is stable. Post procedure Diagnosis Wound #2: Same as Pre-Procedure Plan Wound Cleansing: Wound #2 Left,Proximal Lower Leg: Clean wound with Normal Saline. Dial antibacterial soap, wash wounds, rinse and pat dry prior to dressing wounds May Shower, gently pat wound dry prior to applying new dressing. Wound #6 Right,Dorsal Foot: Clean wound with Normal Saline. Dial antibacterial soap, wash wounds, rinse and pat dry prior to dressing wounds May Shower, gently pat wound dry prior to applying new dressing. Skin Barriers/Peri-Wound Care: Wound #2 Left,Proximal Lower Leg: Triamcinolone Acetonide Ointment (TCA) - and lotion Wound #6 Right,Dorsal Foot: Triamcinolone Acetonide Ointment (TCA) - and lotion Primary Wound Dressing: Wound #2 Left,Proximal Lower  Leg: Xeroform Wound #6 Right,Dorsal Foot: Xeroform Secondary Dressing: Wound #2 Left,Proximal Lower Leg: ABD and Kerlix/Conform Wound #6 Right,Dorsal  Foot: ABD and Kerlix/Conform Dressing Change Frequency: Wound #2 Left,Proximal Lower Leg: Change dressing every other day. Wound #6 Right,Dorsal Foot: Change dressing every other day. Follow-up Appointments: Wound #2 Left,Proximal Lower Leg: Return Appointment in 1 week. Wound #6 Right,Dorsal Foot: Return Appointment in 1 week. Edema Control: Bonnie Kane, Bonnie Kane ([id number]) Wound #2 Left,Proximal Lower Leg: Elevate legs to the level of the heart and pump ankles as often as possible Other: - TubiGrip F Wound #6 Right,Dorsal Foot: Elevate legs to the level of the heart and pump ankles as often as possible Other: - TubiGrip F 1. Based on what I am seeing at this point my suggestion is good to be that we go ahead and discontinue the current wound care measures and initiate treatment with a Xeroform gauze dressing over the wound locations to try to keep things moist and allow them to completely seal up. I expect left leg to be done by next week the right foot may actually be done as well. 2. I would recommend currently that we continue with the Tubigrip I still think that is appropriate for her. 3. I am also can recommend at this time that we continue with elevation is much as possible to try to help keep edema under good control. We will see patient back for reevaluation in 1 week here in the clinic. If anything worsens or changes patient will contact our office for additional recommendations. Electronic Signature(s) Signed: 07/15/2019 5:01:23 PM By: Worthy Keeler PA-C Entered By: Worthy Keeler on 07/15/2019 17:01:22 Bonnie Kane ([id number]) -------------------------------------------------------------------------------- SuperBill Details Patient Name: Bonnie Kane Date of Service: 07/15/2019 Medical Record Number: [id number] Patient  Account Number: [id number] Date of Birth/Sex: [birth year] (73 y.o. F) Treating RN: Army Melia Primary Care Provider: Shawnie Dapper Other Clinician: Referring Provider: Shawnie Dapper Treating Provider/Extender: Melburn Hake, Jimy Gates Weeks in Treatment: 7 Diagnosis Coding ICD-10 Codes Code Description I87.2 Venous insufficiency (chronic) (peripheral) I89.0 Lymphedema, not elsewhere classified I73.89 Other specified peripheral vascular diseases L97.821 Non-pressure chronic ulcer of other part of left lower leg limited to breakdown of skin L97.521 Non-pressure chronic ulcer of other part of left foot limited to breakdown of skin L97.512 Non-pressure chronic ulcer of other part of right foot with fat layer exposed I50.42 Chronic combined systolic (congestive) and diastolic (congestive) heart failure E11.621 Type 2 diabetes mellitus with foot ulcer Facility Procedures CPT4 Code: [id number] Description: [id number] - DEB SUBQ TISSUE 20 SQ CM/< Modifier: Quantity: 1 CPT4 Code: Description: ICD-10 Diagnosis Description L97.821 Non-pressure chronic ulcer of other part of left lower leg limited to breakdo Modifier: wn of skin Quantity: Physician Procedures CPT4 [id number] Description: 11042 - WC PHYS SUBQ TISS 20 SQ CM Modifier: Quantity: 1 CPT4 Code: Description: ICD-10 Diagnosis Description L97.821 Non-pressure chronic ulcer of other part of left lower leg limited to breakdo Modifier: wn of skin Quantity: Electronic Signature(s) Signed: 07/15/2019 5:01:36 PM By: Worthy Keeler PA-C Entered By: Worthy Keeler on 07/15/2019 17:01:36

## 2019-07-15 NOTE — Progress Notes (Addendum)
SHALANE, GREEN ([id number]) Visit Report for 07/15/2019 Arrival Information Details Patient Name: Bonnie Kane, Bonnie Kane Date of Service: 07/15/2019 9:30 AM Medical Record Number: [id number] Patient Account Number: [id number] Date of Birth/Sex: [birth year] (74 y.o. F) Treating RN: Montey Hora Primary Care Clementina Mareno: Shawnie Dapper Other Clinician: Referring Deklen Popelka: Shawnie Dapper Treating Atalie Oros/Extender: Melburn Hake, HOYT Weeks in Treatment: 7 Visit Information History Since Last Visit Added or deleted any medications: No Patient Arrived: Ambulatory Any new allergies or adverse reactions: No Arrival Time: 09:33 Had a fall or experienced change in No Accompanied By: husband activities of daily living that may affect Transfer Assistance: None risk of falls: Patient Identification Verified: Yes Signs or symptoms of abuse/neglect since last visito No Secondary Verification Process Completed: Yes Hospitalized since last visit: No Implantable device outside of the clinic excluding No cellular tissue based products placed in the center since last visit: Has Dressing in Place as Prescribed: Yes Has Compression in Place as Prescribed: Yes Pain Present Now: Yes Electronic Signature(s) Signed: 07/15/2019 4:17:26 PM By: Montey Hora Entered By: Montey Hora on 07/15/2019 09:34:07 Bonnie Kane ([id number]) -------------------------------------------------------------------------------- Encounter Discharge Information Details Patient Name: Bonnie Kane Date of Service: 07/15/2019 9:30 AM Medical Record Number: [id number] Patient Account Number: [id number] Date of Birth/Sex: 26-Apr-1945 (74 y.o. F) Treating RN: Army Melia Primary Care Ilze Roselli: Shawnie Dapper Other Clinician: Referring Avari Gelles: Shawnie Dapper Treating Clarrissa Shimkus/Extender: Melburn Hake, HOYT Weeks in Treatment: 7 Encounter Discharge Information Items Post Procedure Vitals Discharge Condition: Stable Temperature (F):  98.4 Ambulatory Status: Ambulatory Pulse (bpm): 88 Discharge Destination: Home Respiratory Rate (breaths/min): 16 Transportation: Private Auto Blood Pressure (mmHg): 148/72 Accompanied By: husband Schedule Follow-up Appointment: Yes Clinical Summary of Care: Electronic Signature(s) Signed: 07/15/2019 11:52:58 AM By: Army Melia Entered By: Army Melia on 07/15/2019 09:59:44 Bonnie Kane ([id number]) -------------------------------------------------------------------------------- Lower Extremity Assessment Details Patient Name: Bonnie Kane Date of Service: 07/15/2019 9:30 AM Medical Record Number: [id number] Patient Account Number: [id number] Date of Birth/Sex: [birth year] (74 y.o. F) Treating RN: Montey Hora Primary Care Laikyn Gewirtz: Shawnie Dapper Other Clinician: Referring Praise Stennett: Shawnie Dapper Treating Anatalia Kronk/Extender: STONE III, HOYT Weeks in Treatment: 7 Edema Assessment Assessed: [Left: No] [Right: No] Edema: [Left: Yes] [Right: Yes] Calf Left: Right: Point of Measurement: 29 cm From Medial Instep 45 cm 46 cm Ankle Left: Right: Point of Measurement: 11 cm From Medial Instep 24 cm 25 cm Vascular Assessment Pulses: Dorsalis Pedis Palpable: [Left:Yes] [Right:Yes] Electronic Signature(s) Signed: 07/15/2019 4:17:26 PM By: Montey Hora Entered By: Montey Hora on 07/15/2019 09:40:00 Bonnie Kane ([id number]) -------------------------------------------------------------------------------- Multi Wound Chart Details Patient Name: Bonnie Kane Date of Service: 07/15/2019 9:30 AM Medical Record Number: [id number] Patient Account Number: [id number] Date of Birth/Sex: October 05, 1945 (74 y.o. F) Treating RN: Army Melia Primary Care Miriya Cloer: Shawnie Dapper Other Clinician: Referring Binta Statzer: Shawnie Dapper Treating Jasiel Apachito/Extender: STONE III, HOYT Weeks in Treatment: 7 Vital Signs Height(in): 64 Pulse(bpm): 69 Weight(lbs): 250 Blood Pressure(mmHg): 148/72 Body  Mass Index(BMI): 43 Temperature(F): 98.4 Respiratory Rate(breaths/min): 18 Photos: Wound Location: Left Lower Leg Left Lower Leg - Proximal Right Foot - Dorsal Wounding Event: Blister Blister Gradually Appeared Primary Etiology: Trauma, Other Trauma, Other Diabetic Wound/Ulcer of the Lower Extremity Comorbid History: Type II Diabetes, Osteoarthritis, Type II Diabetes, Osteoarthritis, Type II Diabetes, Osteoarthritis, Neuropathy, Received Neuropathy, Received Neuropathy, Received Chemotherapy, Received Radiation Chemotherapy, Received Radiation Chemotherapy, Received Radiation Date Acquired: 05/01/2019 05/01/2019 06/03/2019 Weeks of Treatment: 7 7 5  Wound Status: Open Open Open Measurements L x W x D (cm) 0.1x0.1x0.1 0.1x0.1x0.1 0.2x0.2x0.1 Area (cm) : 0.008 0.008 0.031 Volume (cm) :  0.001 0.001 0.003 % Reduction in Area: 100.00% 99.60% 80.30% % Reduction in Volume: 99.90% 99.50% 81.30% Classification: Full Thickness Without Exposed Full Thickness Without Exposed Grade 1 Support Structures Support Structures Exudate Amount: None Present None Present None Present Wound Margin: Flat and Intact Flat and Intact Flat and Intact Granulation Amount: None Present (0%) None Present (0%) None Present (0%) Necrotic Amount: Large (67-100%) Large (67-100%) Large (67-100%) Necrotic Tissue: Eschar Eschar Eschar Exposed Structures: Fat Layer (Subcutaneous Tissue) Fat Layer (Subcutaneous Tissue) Fat Layer (Subcutaneous Tissue) Exposed: Yes Exposed: Yes Exposed: Yes Fascia: No Fascia: No Fascia: No Tendon: No Tendon: No Tendon: No Muscle: No Muscle: No Muscle: No Joint: No Joint: No Joint: No Bone: No Bone: No Bone: No Epithelialization: Medium (34-66%) Small (1-33%) None Treatment Notes Electronic Signature(s) Signed: 07/15/2019 11:52:58 AM By: Army Melia Entered By: Army Melia on 07/15/2019 09:50:45 Bonnie Kane ([id number]) Bonnie Kane  ([id number]) -------------------------------------------------------------------------------- Multi-Disciplinary Care Plan Details Patient Name: Bonnie Kane Date of Service: 07/15/2019 9:30 AM Medical Record Number: [id number] Patient Account Number: [id number] Date of Birth/Sex: May 23, 1945 (74 y.o. F) Treating RN: Army Melia Primary Care Oisin Yoakum: Shawnie Dapper Other Clinician: Referring Eleisha Branscomb: Shawnie Dapper Treating Adden Strout/Extender: Melburn Hake, HOYT Weeks in Treatment: 7 Active Inactive Abuse / Safety / Falls / Self Care Management Nursing Diagnoses: Potential for falls Goals: Patient will remain injury free related to falls Date Initiated: 05/24/2019 Target Resolution Date: 08/24/2019 Goal Status: Active Interventions: Assess fall risk on admission and as needed Notes: Orientation to the Wound Care Program Nursing Diagnoses: Knowledge deficit related to the wound healing center program Goals: Patient/caregiver will verbalize understanding of the [facility] Date Initiated: 05/24/2019 Target Resolution Date: 08/24/2019 Goal Status: Active Interventions: Provide education on orientation to the wound center Notes: Pain, Acute or Chronic Nursing Diagnoses: Pain, acute or chronic: actual or potential Goals: Patient/caregiver will verbalize adequate pain control between visits Date Initiated: 05/24/2019 Target Resolution Date: 08/24/2019 Goal Status: Active Interventions: Complete pain assessment as per visit requirements Notes: Venous Leg Ulcer Nursing Diagnoses: Potential for venous Insuffiency (use before diagnosis confirmed) SAKHIA, STIDD ([id number]) Goals: Patient will maintain optimal edema control Date Initiated: 05/24/2019 Target Resolution Date: 08/24/2019 Goal Status: Active Interventions: Assess peripheral edema status every visit. Notes: Wound/Skin Impairment Nursing Diagnoses: Impaired tissue integrity Goals: Ulcer/skin breakdown will heal  within 14 weeks Date Initiated: 05/24/2019 Target Resolution Date: 08/24/2019 Goal Status: Active Interventions: Assess patient/caregiver ability to obtain necessary supplies Assess patient/caregiver ability to perform ulcer/skin care regimen upon admission and as needed Assess ulceration(s) every visit Notes: Electronic Signature(s) Signed: 07/15/2019 11:52:58 AM By: Army Melia Entered By: Army Melia on 07/15/2019 09:50:36 Bonnie Kane ([id number]) -------------------------------------------------------------------------------- Pain Assessment Details Patient Name: Bonnie Kane Date of Service: 07/15/2019 9:30 AM Medical Record Number: [id number] Patient Account Number: [id number] Date of Birth/Sex: 1945/12/24 (74 y.o. F) Treating RN: Montey Hora Primary Care Gustaf Mccarter: Shawnie Dapper Other Clinician: Referring Tiauna Whisnant: Shawnie Dapper Treating Zohair Epp/Extender: STONE III, HOYT Weeks in Treatment: 7 Active Problems Location of Pain Severity and Description of Pain Patient Has Paino Yes Site Locations Pain Location: Pain in Ulcers With Dressing Change: Yes Pain Management and Medication Current Pain Management: Electronic Signature(s) Signed: 07/15/2019 4:17:26 PM By: Montey Hora Entered By: Montey Hora on 07/15/2019 09:35:34 Bonnie Kane ([id number]) -------------------------------------------------------------------------------- Patient/Caregiver Education Details Patient Name: Bonnie Kane Date of Service: 07/15/2019 9:30 AM Medical Record Number: [id number] Patient Account Number: [id number] Date of Birth/Gender: 1945-07-27 (73 y.o. F) Treating RN: Army Melia Primary Care Physician: Shawnie Dapper Other Clinician:  Referring Physician: Shawnie Dapper Treating Physician/Extender: Worthy Keeler Weeks in Treatment: 7 Education Assessment Education Provided To: Patient Education Topics Provided Wound/Skin Impairment: Handouts: Caring for Your Ulcer Methods:  Demonstration, Explain/Verbal Responses: State content correctly Electronic Signature(s) Signed: 07/15/2019 11:52:58 AM By: Army Melia Entered By: Army Melia on 07/15/2019 09:58:58 Bonnie Kane ([id number]) -------------------------------------------------------------------------------- Wound Assessment Details Patient Name: Bonnie Kane Date of Service: 07/15/2019 9:30 AM Medical Record Number: [id number] Patient Account Number: [id number] Date of Birth/Sex: March 24, 1946 (74 y.o. F) Treating RN: Army Melia Primary Care Beryle Bagsby: Shawnie Dapper Other Clinician: Referring Yarissa Reining: Shawnie Dapper Treating Delphia Kaylor/Extender: STONE III, HOYT Weeks in Treatment: 7 Wound Status Wound Number: 1 Primary Trauma, Other Etiology: Wound Location: Left Lower Leg Wound Healed - Epithelialized Wounding Event: Blister Status: Date Acquired: 05/01/2019 Comorbid Type II Diabetes, Osteoarthritis, Neuropathy, Received Weeks Of Treatment: 7 History: Chemotherapy, Received Radiation Clustered Wound: No Photos Wound Measurements Length: (cm) Width: (cm) Depth: (cm) Area: (cm) Volume: (cm) 0 % Reduction in Area: 100% 0 % Reduction in Volume: 100% 0 Epithelialization: Medium (34-66%) 0 Tunneling: No 0 Undermining: No Wound Description Classification: Full Thickness Without Exposed Support Structures Wound Margin: Flat and Intact Exudate Amount: None Present Foul Odor After Cleansing: No Slough/Fibrino No Wound Bed Granulation Amount: None Present (0%) Exposed Structure Necrotic Amount: Large (67-100%) Fascia Exposed: No Necrotic Quality: Eschar Fat Layer (Subcutaneous Tissue) Exposed: Yes Tendon Exposed: No Muscle Exposed: No Joint Exposed: No Bone Exposed: No Electronic Signature(s) Signed: 07/15/2019 11:52:58 AM By: Army Melia Entered By: Army Melia on 07/15/2019 09:54:57 Bonnie Kane  ([id number]) -------------------------------------------------------------------------------- Wound Assessment Details Patient Name: Bonnie Kane Date of Service: 07/15/2019 9:30 AM Medical Record Number: [id number] Patient Account Number: [id number] Date of Birth/Sex: 10-01-1945 (74 y.o. F) Treating RN: Army Melia Primary Care Tanysha Quant: Shawnie Dapper Other Clinician: Referring Nicholette Dolson: Shawnie Dapper Treating Merridy Pascoe/Extender: STONE III, HOYT Weeks in Treatment: 7 Wound Status Wound Number: 2 Primary Trauma, Other Etiology: Wound Location: Left, Proximal Lower Leg Wound Open Wounding Event: Blister Status: Date Acquired: 05/01/2019 Comorbid Type II Diabetes, Osteoarthritis, Neuropathy, Received Weeks Of Treatment: 7 History: Chemotherapy, Received Radiation Clustered Wound: No Photos Wound Measurements Length: (cm) 0.1 Width: (cm) 0.1 Depth: (cm) 0.1 Area: (cm) 0.008 Volume: (cm) 0.001 % Reduction in Area: 99.6% % Reduction in Volume: 99.5% Epithelialization: Small (1-33%) Tunneling: No Undermining: No Wound Description Classification: Full Thickness Without Exposed Support Structures Wound Margin: Flat and Intact Exudate Amount: None Present Foul Odor After Cleansing: No Slough/Fibrino No Wound Bed Granulation Amount: None Present (0%) Exposed Structure Necrotic Amount: Large (67-100%) Fascia Exposed: No Necrotic Quality: Eschar Fat Layer (Subcutaneous Tissue) Exposed: Yes Tendon Exposed: No Muscle Exposed: No Joint Exposed: No Bone Exposed: No Treatment Notes Wound #2 (Left, Proximal Lower Leg) Notes xerform, ABD, conform, tubi Electronic Signature(s) Signed: 07/15/2019 11:52:58 AM By: Army Melia Entered By: Army Melia on 07/15/2019 09:54:57 Bonnie Kane ([id number]) Bonnie Kane ([id number]) -------------------------------------------------------------------------------- Wound Assessment Details Patient Name: Bonnie Kane Date of Service:  07/15/2019 9:30 AM Medical Record Number: [id number] Patient Account Number: [id number] Date of Birth/Sex: 03-20-1946 (74 y.o. F) Treating RN: Army Melia Primary Care Chidi Shirer: Shawnie Dapper Other Clinician: Referring Annelie Boak: Shawnie Dapper Treating Rozann Holts/Extender: STONE III, HOYT Weeks in Treatment: 7 Wound Status Wound Number: 6 Primary Diabetic Wound/Ulcer of the Lower Extremity Etiology: Wound Location: Right, Dorsal Foot Wound Open Wounding Event: Gradually Appeared Status: Date Acquired: 06/03/2019 Comorbid Type II Diabetes, Osteoarthritis, Neuropathy, Received Weeks Of Treatment: 5 History: Chemotherapy, Received Radiation Clustered Wound: No Photos Wound Measurements Length: (  cm) 0.2 Width: (cm) 0.2 Depth: (cm) 0.1 Area: (cm) 0.031 Volume: (cm) 0.003 % Reduction in Area: 80.3% % Reduction in Volume: 81.3% Epithelialization: None Tunneling: No Undermining: No Wound Description Classification: Grade 1 Wound Margin: Flat and Intact Exudate Amount: None Present Foul Odor After Cleansing: No Slough/Fibrino No Wound Bed Granulation Amount: None Present (0%) Exposed Structure Necrotic Amount: Large (67-100%) Fascia Exposed: No Necrotic Quality: Eschar Fat Layer (Subcutaneous Tissue) Exposed: Yes Tendon Exposed: No Muscle Exposed: No Joint Exposed: No Bone Exposed: No Treatment Notes Wound #6 (Right, Dorsal Foot) Notes xerform, ABD, conform, tubi Electronic Signature(s) Signed: 07/15/2019 11:52:58 AM By: Army Melia Entered By: Army Melia on 07/15/2019 09:54:58 Bonnie Kane ([id number]) Bonnie Kane ([id number]) -------------------------------------------------------------------------------- Vitals Details Patient Name: Bonnie Kane Date of Service: 07/15/2019 9:30 AM Medical Record Number: [id number] Patient Account Number: [id number] Date of Birth/Sex: [birth year] (74 y.o. F) Treating RN: Montey Hora Primary Care Drelyn Pistilli: Shawnie Dapper Other  Clinician: Referring Vassie Kugel: Shawnie Dapper Treating Syrenity Klepacki/Extender: STONE III, HOYT Weeks in Treatment: 7 Vital Signs Time Taken: 09:35 Temperature (F): 98.4 Height (in): 64 Pulse (bpm): 88 Weight (lbs): 250 Respiratory Rate (breaths/min): 18 Body Mass Index (BMI): 42.9 Blood Pressure (mmHg): 148/72 Reference Range: 80 - 120 mg / dl Electronic Signature(s) Signed: 07/15/2019 4:17:26 PM By: Montey Hora Entered By: Montey Hora on 07/15/2019 09:35:15

## 2019-07-22 ENCOUNTER — Encounter: Payer: Medicare Other | Attending: Physician Assistant | Admitting: Physician Assistant

## 2019-07-22 ENCOUNTER — Other Ambulatory Visit: Payer: Self-pay

## 2019-07-22 DIAGNOSIS — I890 Lymphedema, not elsewhere classified: Secondary | ICD-10-CM | POA: Insufficient documentation

## 2019-07-22 DIAGNOSIS — L97512 Non-pressure chronic ulcer of other part of right foot with fat layer exposed: Secondary | ICD-10-CM | POA: Insufficient documentation

## 2019-07-22 DIAGNOSIS — E1151 Type 2 diabetes mellitus with diabetic peripheral angiopathy without gangrene: Secondary | ICD-10-CM | POA: Insufficient documentation

## 2019-07-22 DIAGNOSIS — I5042 Chronic combined systolic (congestive) and diastolic (congestive) heart failure: Secondary | ICD-10-CM | POA: Insufficient documentation

## 2019-07-22 DIAGNOSIS — L97821 Non-pressure chronic ulcer of other part of left lower leg limited to breakdown of skin: Secondary | ICD-10-CM | POA: Diagnosis not present

## 2019-07-22 DIAGNOSIS — L97521 Non-pressure chronic ulcer of other part of left foot limited to breakdown of skin: Secondary | ICD-10-CM | POA: Diagnosis not present

## 2019-07-22 DIAGNOSIS — I110 Hypertensive heart disease with heart failure: Secondary | ICD-10-CM | POA: Insufficient documentation

## 2019-07-22 DIAGNOSIS — E11621 Type 2 diabetes mellitus with foot ulcer: Secondary | ICD-10-CM | POA: Diagnosis not present

## 2019-07-22 DIAGNOSIS — I872 Venous insufficiency (chronic) (peripheral): Secondary | ICD-10-CM | POA: Insufficient documentation

## 2019-07-22 NOTE — Progress Notes (Signed)
Bonnie Kane, Bonnie Kane ([id number]) Visit Report for 07/22/2019 Arrival Information Details Patient Name: Bonnie Kane Date of Service: 07/22/2019 9:30 AM Medical Record Number: [id number] Patient Account Number: [id number] Date of Birth/Sex: [birth year] (74 y.o. F) Treating RN: Bonnie Kane Primary Care Bonnie Kane: Bonnie Kane Other Clinician: Referring Bonnie Kane: Bonnie Kane Treating Bonnie Kane/Extender: Bonnie Kane, Bonnie Kane in Treatment: 8 Visit Information History Since Last Visit Added or deleted any medications: No Patient Arrived: Ambulatory Any new allergies or adverse reactions: No Arrival Time: 09:31 Had a fall or experienced change in No Accompanied By: spouse activities of daily living that may affect Transfer Assistance: None risk of falls: Patient Identification Verified: Yes Signs or symptoms of abuse/neglect since last visito No Secondary Verification Process Completed: Yes Hospitalized since last visit: No Implantable device outside of the clinic excluding No cellular tissue based products placed in the center since last visit: Has Dressing in Place as Prescribed: Yes Has Compression in Place as Prescribed: Yes Pain Present Now: No Electronic Signature(s) Signed: 07/22/2019 4:25:15 PM By: Bonnie Kane Entered By: Bonnie Kane on 07/22/2019 09:31:31 Bonnie Kane ([id number]) -------------------------------------------------------------------------------- Clinic Level of Care Assessment Details Patient Name: Bonnie Kane Date of Service: 07/22/2019 9:30 AM Medical Record Number: [id number] Patient Account Number: [id number] Date of Birth/Sex: 21-Sep-1945 (74 y.o. F) Treating RN: Bonnie Kane Primary Care Bonnie Kane: Bonnie Kane Other Clinician: Referring Bonnie Kane: Bonnie Kane Treating Bonnie Kane/Extender: Bonnie Kane, Bonnie Kane in Treatment: 8 Clinic Level of Care Assessment Items TOOL 4 Quantity Score []  - Use when only an EandM is performed on FOLLOW-UP visit  0 ASSESSMENTS - Nursing Assessment / Reassessment X - Reassessment of Co-morbidities (includes updates in patient status) 1 10 X- 1 5 Reassessment of Adherence to Treatment Plan ASSESSMENTS - Wound and Skin Assessment / Reassessment []  - Simple Wound Assessment / Reassessment - one wound 0 X- 2 5 Complex Wound Assessment / Reassessment - multiple wounds []  - 0 Dermatologic / Skin Assessment (not related to wound area) ASSESSMENTS - Focused Assessment []  - Circumferential Edema Measurements - multi extremities 0 []  - 0 Nutritional Assessment / Counseling / Intervention []  - 0 Lower Extremity Assessment (monofilament, tuning fork, pulses) []  - 0 Peripheral Arterial Disease Assessment (using hand held doppler) ASSESSMENTS - Ostomy and/or Continence Assessment and Care []  - Incontinence Assessment and Management 0 []  - 0 Ostomy Care Assessment and Management (repouching, etc.) PROCESS - Coordination of Care X - Simple Patient / Family Education for ongoing care 1 15 []  - 0 Complex (extensive) Patient / Family Education for ongoing care []  - 0 Staff obtains Programmer, systems, Records, Test Results / Process Orders []  - 0 Staff telephones HHA, Nursing Homes / Clarify orders / etc []  - 0 Routine Transfer to another Facility (non-emergent condition) []  - 0 Routine Hospital Admission (non-emergent condition) []  - 0 New Admissions / Biomedical engineer / Ordering NPWT, Apligraf, etc. []  - 0 Emergency Hospital Admission (emergent condition) X- 1 10 Simple Discharge Coordination []  - 0 Complex (extensive) Discharge Coordination PROCESS - Special Needs []  - Pediatric / Minor Patient Management 0 []  - 0 Isolation Patient Management []  - 0 Hearing / Language / Visual special needs []  - 0 Assessment of Community assistance (transportation, D/C planning, etc.) Bonnie Kane, Bonnie Kane ([id number]) []  - 0 Additional assistance / Altered mentation []  - 0 Support Surface(s) Assessment (bed, cushion,  seat, etc.) INTERVENTIONS - Wound Cleansing / Measurement []  - Simple Wound Cleansing - one wound 0 X- 2 5 Complex Wound Cleansing - multiple wounds X- 1 5 Wound Imaging (  photographs - any number of wounds) []  - 0 Wound Tracing (instead of photographs) []  - 0 Simple Wound Measurement - one wound X- 2 5 Complex Wound Measurement - multiple wounds INTERVENTIONS - Wound Dressings []  - Small Wound Dressing one or multiple wounds 0 []  - 0 Medium Wound Dressing one or multiple wounds []  - 0 Large Wound Dressing one or multiple wounds []  - 0 Application of Medications - topical []  - 0 Application of Medications - injection INTERVENTIONS - Miscellaneous []  - External ear exam 0 []  - 0 Specimen Collection (cultures, biopsies, blood, body fluids, etc.) []  - 0 Specimen(s) / Culture(s) sent or taken to Lab for analysis []  - 0 Patient Transfer (multiple staff / Civil Service fast streamer / Similar devices) []  - 0 Simple Staple / Suture removal (25 or less) []  - 0 Complex Staple / Suture removal (26 or more) []  - 0 Hypo / Hyperglycemic Management (close monitor of Blood Glucose) []  - 0 Ankle / Brachial Index (ABI) - do not check if billed separately X- 1 5 Vital Signs Has the patient been seen at the hospital within the last three years: Yes Total Score: 80 Level Of Care: New/Established - Level 3 Electronic Signature(s) Signed: 07/22/2019 3:08:20 PM By: Bonnie Kane Entered By: Bonnie Kane on 07/22/2019 09:45:32 Bonnie Kane ([id number]) -------------------------------------------------------------------------------- Encounter Discharge Information Details Patient Name: Bonnie Kane Date of Service: 07/22/2019 9:30 AM Medical Record Number: [id number] Patient Account Number: [id number] Date of Birth/Sex: [birth year] (74 y.o. F) Treating RN: Bonnie Kane Primary Care Aleayah Chico: Bonnie Kane Other Clinician: Referring Monice Lundy: Bonnie Kane Treating Natthew Marlatt/Extender: Bonnie Kane, Bonnie Kane in  Treatment: 8 Encounter Discharge Information Items Discharge Condition: Stable Ambulatory Status: Ambulatory Discharge Destination: Home Transportation: Private Auto Accompanied By: husband Schedule Follow-up Appointment: Yes Clinical Summary of Care: Electronic Signature(s) Signed: 07/22/2019 3:08:20 PM By: Bonnie Kane Entered By: Bonnie Kane on 07/22/2019 09:46:02 Bonnie Kane ([id number]) -------------------------------------------------------------------------------- Lower Extremity Assessment Details Patient Name: Bonnie Kane Date of Service: 07/22/2019 9:30 AM Medical Record Number: [id number] Patient Account Number: [id number] Date of Birth/Sex: 1945-10-16 (73 y.o. F) Treating RN: Bonnie Kane Primary Care Ketina Mars: Bonnie Kane Other Clinician: Referring Shakhia Gramajo: Bonnie Kane Treating Marissa Weaver/Extender: STONE III, Bonnie Kane in Treatment: 8 Edema Assessment Assessed: [Left: No] [Right: No] Edema: [Left: Yes] [Right: Yes] Vascular Assessment Pulses: Dorsalis Pedis Palpable: [Left:Yes] [Right:Yes] Electronic Signature(s) Signed: 07/22/2019 4:25:15 PM By: Bonnie Kane Entered By: Bonnie Kane on 07/22/2019 09:34:46 Bonnie Kane ([id number]) -------------------------------------------------------------------------------- Multi Wound Chart Details Patient Name: Bonnie Kane Date of Service: 07/22/2019 9:30 AM Medical Record Number: [id number] Patient Account Number: [id number] Date of Birth/Sex: [birth year] (74 y.o. 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 Comorbid History: Type II Diabetes, Osteoarthritis, Type II Diabetes, Osteoarthritis, N/A Neuropathy, Received Neuropathy, Received Chemotherapy, Received Radiation Chemotherapy, Received Radiation Date Acquired: 05/01/2019 06/03/2019 N/A Kane of Treatment: 8 6 N/A Wound Status: Healed - Epithelialized Healed - Epithelialized N/A Measurements L x W x D (cm) 0x0x0 0x0x0 N/A Area (cm) : 0 0 N/A Volume (cm) : 0 0 N/A % Reduction in Area: 100.00% 100.00% N/A % Reduction in Volume: 100.00% 100.00% N/A  Classification: Full Thickness Without Exposed Grade 1 N/A Support Structures Exudate Amount: None Present None Present N/A Wound Margin: Flat and Intact Flat and Intact N/A Granulation Amount: None Present (0%) None Present (0%) N/A Necrotic Amount: Large (67-100%) Large (67-100%) N/A Necrotic Tissue: Eschar Eschar N/A Exposed Structures: Fat Layer (Subcutaneous Tissue) Fat Layer (Subcutaneous Tissue) N/A Exposed: Yes Exposed: Yes Fascia: No Fascia: No Tendon: No Tendon: No Muscle: No Muscle: No Joint: No Joint: No Bone: No Bone: No Epithelialization: Large (67-100%) None N/A Treatment Notes Electronic Signature(s) Signed: 07/22/2019 3:08:20 PM By: Bonnie Kane Entered By: Bonnie Kane on 07/22/2019 09:44:40 Bonnie Kane ([id number]) Bonnie Kane ([id number]) -------------------------------------------------------------------------------- Multi-Disciplinary Care Plan Details Patient Name: Bonnie Kane Date of Service: 07/22/2019 9:30 AM Medical Record Number: [id number] Patient Account Number: [id number] Date of Birth/Sex: May 03, 1945 (74 y.o. F) Treating RN: Bonnie Kane Primary Care Anetria Harwick: Bonnie Kane Other Clinician: Referring [place] Sarinana: Bonnie Kane Treating Chanay Nugent/Extender: Bonnie Kane, Bonnie Kane in Treatment: 8 Active Inactive Electronic  Signature(s) Signed: 07/22/2019 3:08:20 PM By: Bonnie Kane Entered By: Bonnie Kane on 07/22/2019 09:44:28 Bonnie Kane ([id number]) -------------------------------------------------------------------------------- Pain Assessment Details Patient Name: Bonnie Kane Date of Service: 07/22/2019 9:30 AM Medical Record Number: [id number] Patient Account Number: [id number] Date of Birth/Sex: 13-Jun-1945 (73 y.o. F) Treating RN: Bonnie Kane Primary Care Brynja Marker: Bonnie Kane Other Clinician: Referring Richrd Kuzniar: Bonnie Kane Treating Jaelle Campanile/Extender: Bonnie Kane, Bonnie Kane in Treatment: 8 Active Problems Location of Pain Severity and Description of Pain Patient Has Paino No Site Locations Pain Management and Medication Current Pain Management: Electronic Signature(s) Signed: 07/22/2019 4:25:15 PM By: Bonnie Kane Entered By: Bonnie Kane on 07/22/2019 09:32:23 Bonnie Kane ([id number]) -------------------------------------------------------------------------------- Patient/Caregiver Education Details Patient Name: Bonnie Kane Date of Service: 07/22/2019 9:30 AM Medical Record Number: [id number] Patient Account Number: [id number] Date of Birth/Gender: 06/21/1945 (73 y.o. F) Treating RN: Bonnie Kane Primary Care Physician: Bonnie Kane Other Clinician: Referring Physician: Shawnie Kane Treating Physician/Extender: Sharalyn Ink in Treatment: 8 Education Assessment Education Provided To: Patient Education Topics Provided Wound/Skin Impairment: Handouts: Caring for Your Ulcer Methods: Demonstration, Explain/Verbal Responses: State content correctly Electronic Signature(s) Signed: 07/22/2019 3:08:20 PM By: Bonnie Kane Entered By: Bonnie Kane on 07/22/2019 09:45:43 Bonnie Kane ([id number]) -------------------------------------------------------------------------------- Wound Assessment Details Patient Name: Bonnie Kane Date of Service: 07/22/2019 9:30 AM Medical  Record Number: [id number] Patient Account Number: [id number] Date of Birth/Sex: 1946-04-08 (73 y.o. F) Treating RN: Bonnie Kane Primary Care Lynnley Doddridge: Bonnie Kane Other Clinician: Referring Demya Scruggs: Bonnie Kane Treating Clavin Ruhlman/Extender: STONE III, Bonnie Kane in Treatment: 8 Wound Status Wound Number: 2 Primary Trauma, Other Etiology: Wound Location: Left, Proximal Lower Leg Wound Healed - Epithelialized Wounding Event: Blister Status: Date Acquired: 05/01/2019 Comorbid Type II Diabetes, Osteoarthritis, Neuropathy, Received Kane Of Treatment: 8 History: Chemotherapy, Received Radiation Clustered Wound: No Photos Wound Measurements Length: (cm) Width: (cm) Depth: (cm) Area: (cm) Volume: (cm) 0 % Reduction in Area: 100% 0 % Reduction in Volume: 100% 0 Epithelialization: Large (67-100%) 0 Tunneling: No 0 Undermining: No Wound Description Classification: Full Thickness Without Exposed Support Structures Wound Margin: Flat and Intact Exudate Amount: None Present Foul Odor After Cleansing: No Slough/Fibrino No Wound Bed Granulation Amount: None Present (0%) Exposed Structure Necrotic Amount: Large (67-100%) Fascia Exposed: No Necrotic Quality: Eschar Fat Layer (Subcutaneous Tissue) Exposed: Yes Tendon Exposed: No Muscle Exposed: No Joint Exposed: No Bone Exposed: No Electronic Signature(s) Signed: 07/22/2019 3:08:20 PM By: Bonnie Kane Entered By: Bonnie Kane on 07/22/2019 09:44:12 Bonnie Kane ([id number]) -------------------------------------------------------------------------------- Wound Assessment Details Patient Name: Bonnie Kane Date  of Service: 07/22/2019 9:30 AM Medical Record Number: [id number] Patient Account Number: [id number] Date of Birth/Sex: November 10, 1945 (74 y.o. F) Treating RN: Bonnie Kane Primary Care Daritza Brees: Bonnie Kane Other Clinician: Referring Loyalty Brashier: Bonnie Kane Treating Rechel Delosreyes/Extender: STONE III, Bonnie Kane in  Treatment: 8 Wound Status Wound Number: 6 Primary Diabetic Wound/Ulcer of the Lower Extremity Etiology: Wound Location: Right, Dorsal Foot Wound Healed - Epithelialized Wounding Event: Gradually Appeared Status: Date Acquired: 06/03/2019 Comorbid Type II Diabetes, Osteoarthritis, Neuropathy, Received Kane Of Treatment: 6 History: Chemotherapy, Received Radiation Clustered Wound: No Photos Wound Measurements Length: (cm) Width: (cm) Depth: (cm) Area: (cm) Volume: (cm) 0 % Reduction in Area: 100% 0 % Reduction in Volume: 100% 0 Epithelialization: None 0 Tunneling: No 0 Undermining: No Wound Description Classification: Grade 1 Wound Margin: Flat and Intact Exudate Amount: None Present Foul Odor After Cleansing: No Slough/Fibrino No Wound Bed Granulation Amount: None Present (0%) Exposed Structure Necrotic Amount: Large (67-100%) Fascia Exposed: No Necrotic Quality: Eschar Fat Layer (Subcutaneous Tissue) Exposed: Yes Tendon Exposed: No Muscle Exposed: No Joint Exposed: No Bone Exposed: No Electronic Signature(s) Signed: 07/22/2019 3:08:20 PM By: Bonnie Kane Entered By: Bonnie Kane on 07/22/2019 09:44:12 Bonnie Kane ([id number]) -------------------------------------------------------------------------------- Vitals Details Patient Name: Bonnie Kane Date of Service: 07/22/2019 9:30 AM Medical Record Number: [id number] Patient Account Number: [id number] Date of Birth/Sex: 1946-04-15 (74 y.o. F) Treating RN: Bonnie Kane Primary Care Elaiza Shoberg: Bonnie Kane Other Clinician: Referring Ronnae Kaser: Bonnie Kane Treating Charlann Wayne/Extender: STONE III, Bonnie Kane in Treatment: 8 Vital Signs Time Taken: 09:32 Temperature (F): 97.8 Height (in): 64 Pulse (bpm): 85 Weight (lbs): 250 Respiratory Rate (breaths/min): 16 Body Mass Index (BMI): 42.9 Blood Pressure (mmHg): 146/68 Reference Range: 80 - 120 mg / dl Electronic Signature(s) Signed: 07/22/2019 4:25:15 PM By:  Bonnie Kane Entered By: Bonnie Kane on 07/22/2019 09:32:43

## 2019-07-22 NOTE — Progress Notes (Addendum)
Bonnie Kane, Bonnie Kane ([id number]) Visit Report for 07/22/2019 Chief Complaint Document Details Patient Name: Bonnie Kane, Bonnie Kane Date of Service: 07/22/2019 9:30 AM Medical Record Number: [id number] Patient Account Number: [id number] Date of Birth/Sex: 1945/06/08 (74 y.o. F) Treating RN: Army Melia Primary Care Provider: Shawnie Dapper Other Clinician: Referring Provider: Shawnie Dapper Treating Provider/Extender: Melburn Hake, Manoah Deckard Weeks in Treatment: 8 Information Obtained from: Patient Chief Complaint Left LE Blisters and Edema Electronic Signature(s) Signed: 07/22/2019 9:42:45 AM By: Worthy Keeler PA-C Entered By: Worthy Keeler on 07/22/2019 09:42:45 Bonnie Kane ([id number]) -------------------------------------------------------------------------------- HPI Details Patient Name: Bonnie Kane Date of Service: 07/22/2019 9:30 AM Medical Record Number: [id number] Patient Account Number: [id number] Date of Birth/Sex: 1945/04/22 (73 y.o. F) Treating RN: Army Melia Primary Care Provider: Shawnie Dapper Other Clinician: Referring Provider: Shawnie Dapper Treating Provider/Extender: Melburn Hake, Varian Innes Weeks in Treatment: 8 History of Present Illness HPI Description: 05/24/19 patient presents today for initial evaluation here in our clinic as a referral from Dr. Julien Nordmann who is her family medicine practitioner. He has been taking care of this patient for several weeks now it appears. At least since it appears December 2020 although she has had some issue for quite some time. She has seen the vascular surgeon at [facility] and did have ABIs and TBI's which were performed back at that point. That was in September 2020. With that being said unfortunately she did not show signs of low ABIs and TBI's on the left which is where the blistering is taking place at this point her right seem to be in good shape as far as the results were concerned. She had a right ABI of 1.17 with a TBI of 0.85  and a left ABI of 0.68 with a TBI of 0.43. This is at least moderate disease. The patient does have diabetes although apparently this is diet controlled according to what they tell me at this point. She does also have congestive heart failure, bilateral lower extremity edema with a history of chronic venous stasis, systolic heart murmur, hypertension, and appears to have also had a transcatheter aortic valve replacement. Was on April 2018. The patient has been on doxycycline initially by Dr. Ernestine Conrad and then subsequently this was switched just yesterday to Cipro. He does note in his note yesterday that she had improved but he still recommended that she come to the wound care center for further evaluation and treatment. He did also on top given her the Cipro to cover the Serratia give her Vicodin for helping with the pain he also performed a basic metabolic panel and a CBC yesterday as well. He does have a history of having been recommended compression stockings in the past although apparently this ripped skin off when they remove them according to the patient and her husband. He is present during the evaluation today as well. Is also do CBC which actually appear to be doing fairly well the white blood cell count was 7.0 and appears normal. The metabolic panel did show an elevation in the BUN and creatinine as well as the ratio at this point her glucose was 231 not be fasting but still that is an elevation range. Right now the patient is taking 40 mg of Lasix in the morning and 20 mg in the evening. He is also get a recheck the metabolic panel in a week. He notes that her kidney function was just a little bit down compared to her baseline. His most recent hemoglobin A1c was 7.2 on October 25, 2018. That was found in care everywhere from [place]. Patient's wound culture was actually performed just 4 days ago and again did show Serratia as the causative organism for the infection  currently and obviously doxycycline is not to work for this that is why she was switched to Cipro. 05/31/2019 upon evaluation today patient appears to be doing well at this time with regard to her wounds on the left lower extremity. Several of the blisters that she did have have also significantly improved. These have ruptured but do not appear to be spreading and a lot of the tissue though I think will come off I do not think we have to rush as far as this is concerned she is having a lot of discomfort still that really is disproportionate even to her wounds which seem to be improving. She did get the Farrow wrap although her husband states that she is not good to be able to wear the sock portion that goes underneath as it has something covering her toes she apparently is unable to wear anything that covers her toes. He states he is been married for close to 50 years and is never seen her where anything that even resembles a sock. 06/10/2019 upon evaluation today patient appears to be doing well with regard to her wounds at this point. She is still experiencing significant pain currently that I do believe is more neuropathic in nature I discussed this previous. Her wounds are healing quite nicely and to be honest her pain is disproportionate to the open wounds that she actually has present at this point she also does not appear to be showing any signs of infection. She does have numbness in her toes and then subsequently with regard to her wounds and even some areas that are not even open she has significant pain which I would describe is more neuropathic/nerve in nature. This was discussed with her and her husband yet again today. I did explain again today that I do not believe simply performing venous ablation is good to take care of this pain either. I did discuss possible options including since she has had issues in the past with other medications possibly trying Cymbalta her twin brother actually  has a similar issue with neuropathy and neuropathic pain and takes Cymbalta with good result. I think that is at least a possibility for her to be honest. She could talk with her primary care provider about it we do not prescribe that medication at the wound center just due to the nature of follow-up this necessary for that medicine. 06/18/2019 upon evaluation today patient appears to be doing well with regard to her wounds in general. I am extremely pleased with the progress that she is made and in fact her wounds seem to be healing quite nicely with compression. With that being said I do believe that she is going need something ongoing as far as compression is concerned. If she does not have ongoing compression then she will obviously revert back to where things were in the end. Nonetheless I do think at minimum she may need to have Tubigrip that she wears ongoing. I would discuss this in greater detail with her next week. For the time being we are more focused on trying to get her wounds healed and again she is definitely doing much better in that regard. Unfortunately I am told that when they change this that she has excruciating pain for 8 hours I really not sure  why she will be having this pain obviously there is no reason that the silver alginate really should be causing pain like this and again her pain again appears to be disproportionate to the wounds at this point especially considering there healing so well. For that reason I really think that this may be Morbid just changing the dressings in general. 06/24/2019 upon evaluation today patient appears to be doing very well with regard to her wounds. She is making some progress at all wound locations. With that being said I think she may do better switching to a collagen-based dressing also think that she would probably do better with regard to her wounds we were able to debride the wounds in order to clear off the slough and biofilm that I think  is slowing down the healing process. The patient is in agreement with giving this a try after some discussion of how this could benefit her. She is in agreement with that. I therefore did proceed after obtaining consent with sharp debridement today. 07/01/2019 upon evaluation today patient appears to be doing well with regard to her wounds. She is making some progress which is great news. Fortunately there is no evidence of active infection at this time. With that being said the patient is still having significant pain which I believe is really unrelated to the wounds themselves she hurts even over the areas of her skin where there is no pain including her feet I believe this is all related more to neuropathy. She does not see a specialist for this currently she is on Cymbalta however. Unfortunately her wounds are not closed yet although they are better after cleaning things away last week. There still is some work to do this week as well as far as keeping the area free and clear. Fortunately there is no sign in general of active infection at this point. No fevers, chills, nausea, vomiting, or diarrhea. Bonnie Kane, Bonnie Kane ([id number]) 07/08/2019 upon evaluation today patient appears to be doing better for the most part compared to previous evaluation. With that being said she does have some of the collagen/dressing material stuck to the wound beds of the left lower extremity. This fortunately was able to be removed by way of sharp debridement without damaging any of the good healing underneath at both locations. With that being said there fortunately is no signs of active infection at this time which is great news. No fever chills noted. The patient apparently has not been putting any moisture on the dressings before putting them on the leg area. Subsequently this is 1 reason why it probably dried out although I think the other is actually that to be honest the wounds are not draining nearly as much as they  used to simply due to the fact that she is healing quite well. I am really not sure why they quit using any moisture on the dressings as far as saline was concerned other than they tell us that we told him not to but that was not the case. 07/15/2019 upon evaluation today patient actually appears to be doing excellent with regard to her wounds. She has been tolerating the dressing changes actually quite well without complication will be using collagen the only issue here is that she continues to build up collagen over the wounds where this is getting somewhat dry. I think this is the fact related to the fact that she is actually healing and therefore having much less drainage which is keeping the collagen from becoming  as moist and dissolving as we would like to have seen. 07/22/2019 upon evaluation today patient appears to be doing extremely well with regard to her legs. In fact everything appears to be healed at this point which is great news. There is no signs of active infection and overall I am extremely happy with the progress that she has made. The patient does have her compression stockings with her today which is excellent news as well. Electronic Signature(s) Signed: 07/22/2019 1:34:18 PM By: Worthy Keeler PA-C Entered By: Worthy Keeler on 07/22/2019 13:34:18 Bonnie Kane, Bonnie Kane ([id number]) -------------------------------------------------------------------------------- Physical Exam Details Patient Name: Bonnie Kane Date of Service: 07/22/2019 9:30 AM Medical Record Number: [id number] Patient Account Number: [id number] Date of Birth/Sex: 08-30-1945 (73 y.o. F) Treating RN: Army Melia Primary Care Provider: Shawnie Dapper Other Clinician: Referring Provider: Shawnie Dapper Treating Provider/Extender: Bonnie Kane, Eudell Julian Weeks in Treatment: 8 Constitutional Well-nourished and well-hydrated in no acute distress. Respiratory normal breathing without difficulty. Psychiatric this patient is  able to make decisions and demonstrates good insight into disease process. Alert and Oriented x 3. pleasant and cooperative. Notes Upon inspection patient's wounds appear to be completely epithelialized at this point and overall have done extremely well. Overall I am very pleased with the progress that she has made. Electronic Signature(s) Signed: 07/22/2019 1:35:02 PM By: Worthy Keeler PA-C Entered By: Worthy Keeler on 07/22/2019 13:35:01 Bonnie Kane ([id number]) -------------------------------------------------------------------------------- Physician Orders Details Patient Name: Bonnie Kane Date of Service: 07/22/2019 9:30 AM Medical Record Number: [id number] Patient Account Number: [id number] Date of Birth/Sex: [birth year] (73 y.o. F) Treating RN: Army Melia Primary Care Provider: Shawnie Dapper Other Clinician: Referring Provider: Shawnie Dapper Treating Provider/Extender: Melburn Hake, Gustavo Meditz Weeks in Treatment: 8 Verbal / Phone Orders: No Diagnosis Coding ICD-10 Coding Code Description I87.2 Venous insufficiency (chronic) (peripheral) I89.0 Lymphedema, not elsewhere classified I73.89 Other specified peripheral vascular diseases L97.821 Non-pressure chronic ulcer of other part of left lower leg limited to breakdown of skin L97.521 Non-pressure chronic ulcer of other part of left foot limited to breakdown of skin L97.512 Non-pressure chronic ulcer of other part of right foot with fat layer exposed I50.42 Chronic combined systolic (congestive) and diastolic (congestive) heart failure E11.621 Type 2 diabetes mellitus with foot ulcer Discharge From [facility] o Discharge from [place] complete Electronic Signature(s) Signed: 07/22/2019 3:08:20 PM By: Army Melia Signed: 07/26/2019 10:31:16 AM By: Worthy Keeler PA-C Entered By: Army Melia on 07/22/2019 09:45:01 Bonnie Kane  ([id number]) -------------------------------------------------------------------------------- Problem List Details Patient Name: Bonnie Kane Date of Service: 07/22/2019 9:30 AM Medical Record Number: [id number] Patient Account Number: [id number] Date of Birth/Sex: June 27, 1945 (73 y.o. F) Treating RN: Army Melia Primary Care Provider: Shawnie Dapper Other Clinician: Referring Provider: Shawnie Dapper Treating Provider/Extender: Melburn Hake, Mialee Weyman Weeks in Treatment: 8 Active Problems ICD-10 Evaluated Encounter Code Description Active Date Today Diagnosis I87.2 Venous insufficiency (chronic) (peripheral) 05/24/2019 No Yes I89.0 Lymphedema, not elsewhere classified 05/24/2019 No Yes I73.89 Other specified peripheral vascular diseases 05/24/2019 No Yes L97.821 Non-pressure chronic ulcer of other part of left lower leg limited to 05/24/2019 No Yes breakdown of skin L97.521 Non-pressure chronic ulcer of other part of left foot limited to breakdown of 05/24/2019 No Yes skin L97.512 Non-pressure chronic ulcer of other part of right foot with fat layer exposed 06/10/2019 No Yes I50.42 Chronic combined systolic (congestive) and diastolic (congestive) heart 05/24/2019 No Yes failure E11.621 Type 2 diabetes mellitus with foot ulcer 05/24/2019 No Yes Inactive Problems Resolved Problems Electronic Signature(s)  Signed: 07/22/2019 9:27:01 AM By: Worthy Keeler PA-C Entered By: Worthy Keeler on 07/22/2019 09:27:01 Bonnie Kane ([id number]) -------------------------------------------------------------------------------- Progress Note Details Patient Name: Bonnie Kane Date of Service: 07/22/2019 9:30 AM Medical Record Number: [id number] Patient Account Number: [id number] Date of Birth/Sex: Oct 23, 1945 (73 y.o. F) Treating RN: Army Melia Primary Care Provider: Shawnie Dapper Other Clinician: Referring Provider: Shawnie Dapper Treating Provider/Extender: Melburn Hake, Kashif Pooler Weeks in Treatment: 8 Subjective Chief  Complaint Information obtained from Patient Left LE Blisters and Edema History of Present Illness (HPI) 05/24/19 patient presents today for initial evaluation here in our clinic as a referral from Dr. Julien Nordmann who is her family medicine practitioner. He has been taking care of this patient for several weeks now it appears. At least since it appears December 2020 although she has had some issue for quite some time. She has seen the vascular surgeon at [facility] and did have ABIs and TBI's which were performed back at that point. That was in September 2020. With that being said unfortunately she did not show signs of low ABIs and TBI's on the left which is where the blistering is taking place at this point her right seem to be in good shape as far as the results were concerned. She had a right ABI of 1.17 with a TBI of 0.85 and a left ABI of 0.68 with a TBI of 0.43. This is at least moderate disease. The patient does have diabetes although apparently this is diet controlled according to what they tell me at this point. She does also have congestive heart failure, bilateral lower extremity edema with a history of chronic venous stasis, systolic heart murmur, hypertension, and appears to have also had a transcatheter aortic valve replacement. Was on April 2018. The patient has been on doxycycline initially by Dr. Ernestine Conrad and then subsequently this was switched just yesterday to Cipro. He does note in his note yesterday that she had improved but he still recommended that she come to the wound care center for further evaluation and treatment. He did also on top given her the Cipro to cover the Serratia give her Vicodin for helping with the pain he also performed a basic metabolic panel and a CBC yesterday as well. He does have a history of having been recommended compression stockings in the past although apparently this ripped skin off when they remove them according to the patient  and her husband. He is present during the evaluation today as well. Is also do CBC which actually appear to be doing fairly well the white blood cell count was 7.0 and appears normal. The metabolic panel did show an elevation in the BUN and creatinine as well as the ratio at this point her glucose was 231 not be fasting but still that is an elevation range. Right now the patient is taking 40 mg of Lasix in the morning and 20 mg in the evening. He is also get a recheck the metabolic panel in a week. He notes that her kidney function was just a little bit down compared to her baseline. His most recent hemoglobin A1c was 7.2 on October 25, 2018. That was found in care everywhere from [place]. Patient's wound culture was actually performed just 4 days ago and again did show Serratia as the causative organism for the infection currently and obviously doxycycline is not to work for this that is why she was switched to Cipro. 05/31/2019 upon evaluation today patient appears to  be doing well at this time with regard to her wounds on the left lower extremity. Several of the blisters that she did have have also significantly improved. These have ruptured but do not appear to be spreading and a lot of the tissue though I think will come off I do not think we have to rush as far as this is concerned she is having a lot of discomfort still that really is disproportionate even to her wounds which seem to be improving. She did get the Farrow wrap although her husband states that she is not good to be able to wear the sock portion that goes underneath as it has something covering her toes she apparently is unable to wear anything that covers her toes. He states he is been married for close to 50 years and is never seen her where anything that even resembles a sock. 06/10/2019 upon evaluation today patient appears to be doing well with regard to her wounds at this point. She is still experiencing  significant pain currently that I do believe is more neuropathic in nature I discussed this previous. Her wounds are healing quite nicely and to be honest her pain is disproportionate to the open wounds that she actually has present at this point she also does not appear to be showing any signs of infection. She does have numbness in her toes and then subsequently with regard to her wounds and even some areas that are not even open she has significant pain which I would describe is more neuropathic/nerve in nature. This was discussed with her and her husband yet again today. I did explain again today that I do not believe simply performing venous ablation is good to take care of this pain either. I did discuss possible options including since she has had issues in the past with other medications possibly trying Cymbalta her twin brother actually has a similar issue with neuropathy and neuropathic pain and takes Cymbalta with good result. I think that is at least a possibility for her to be honest. She could talk with her primary care provider about it we do not prescribe that medication at the wound center just due to the nature of follow-up this necessary for that medicine. 06/18/2019 upon evaluation today patient appears to be doing well with regard to her wounds in general. I am extremely pleased with the progress that she is made and in fact her wounds seem to be healing quite nicely with compression. With that being said I do believe that she is going need something ongoing as far as compression is concerned. If she does not have ongoing compression then she will obviously revert back to where things were in the end. Nonetheless I do think at minimum she may need to have Tubigrip that she wears ongoing. I would discuss this in greater detail with her next week. For the time being we are more focused on trying to get her wounds healed and again she is definitely doing much better in that  regard. Unfortunately I am told that when they change this that she has excruciating pain for 8 hours I really not sure why she will be having this pain obviously there is no reason that the silver alginate really should be causing pain like this and again her pain again appears to be disproportionate to the wounds at this point especially considering there healing so well. For that reason I really think that this may be Morbid just changing the dressings in  general. 06/24/2019 upon evaluation today patient appears to be doing very well with regard to her wounds. She is making some progress at all wound locations. With that being said I think she may do better switching to a collagen-based dressing also think that she would probably do better with regard to her wounds we were able to debride the wounds in order to clear off the slough and biofilm that I think is slowing down the healing process. The patient is in agreement with giving this a try after some discussion of how this could benefit her. She is in agreement with that. I therefore did proceed after obtaining consent with sharp debridement today. 07/01/2019 upon evaluation today patient appears to be doing well with regard to her wounds. She is making some progress which is great news. Bonnie Kane, Bonnie Kane ([id number]) Fortunately there is no evidence of active infection at this time. With that being said the patient is still having significant pain which I believe is really unrelated to the wounds themselves she hurts even over the areas of her skin where there is no pain including her feet I believe this is all related more to neuropathy. She does not see a specialist for this currently she is on Cymbalta however. Unfortunately her wounds are not closed yet although they are better after cleaning things away last week. There still is some work to do this week as well as far as keeping the area free and clear. Fortunately there is no sign in general of  active infection at this point. No fevers, chills, nausea, vomiting, or diarrhea. 07/08/2019 upon evaluation today patient appears to be doing better for the most part compared to previous evaluation. With that being said she does have some of the collagen/dressing material stuck to the wound beds of the left lower extremity. This fortunately was able to be removed by way of sharp debridement without damaging any of the good healing underneath at both locations. With that being said there fortunately is no signs of active infection at this time which is great news. No fever chills noted. The patient apparently has not been putting any moisture on the dressings before putting them on the leg area. Subsequently this is 1 reason why it probably dried out although I think the other is actually that to be honest the wounds are not draining nearly as much as they used to simply due to the fact that she is healing quite well. I am really not sure why they quit using any moisture on the dressings as far as saline was concerned other than they tell us that we told him not to but that was not the case. 07/15/2019 upon evaluation today patient actually appears to be doing excellent with regard to her wounds. She has been tolerating the dressing changes actually quite well without complication will be using collagen the only issue here is that she continues to build up collagen over the wounds where this is getting somewhat dry. I think this is the fact related to the fact that she is actually healing and therefore having much less drainage which is keeping the collagen from becoming as moist and dissolving as we would like to have seen. 07/22/2019 upon evaluation today patient appears to be doing extremely well with regard to her legs. In fact everything appears to be healed at this point which is great news. There is no signs of active infection and overall I am extremely happy with the progress that she has made.  The patient does have her compression stockings with her today which is excellent news as well. Objective Constitutional Well-nourished and well-hydrated in no acute distress. Vitals Time Taken: 9:32 AM, Height: 64 in, Weight: 250 lbs, BMI: 42.9, Temperature: 97.8 F, Pulse: 85 bpm, Respiratory Rate: 16 breaths/min, Blood Pressure: 146/68 mmHg. Respiratory normal breathing without difficulty. Psychiatric this patient is able to make decisions and demonstrates good insight into disease process. Alert and Oriented x 3. pleasant and cooperative. General Notes: Upon inspection patient's wounds appear to be completely epithelialized at this point and overall have done extremely well. Overall I am very pleased with the progress that she has made. Integumentary (Hair, Skin) Wound #2 status is Healed - Epithelialized. Original cause of wound was Blister. The wound is located on the Left,Proximal Lower Leg. The wound measures 0cm length x 0cm width x 0cm depth; 0cm^2 area and 0cm^3 volume. There is Fat Layer (Subcutaneous Tissue) Exposed exposed. There is no tunneling or undermining noted. There is a none present amount of drainage noted. The wound margin is flat and intact. There is no granulation within the wound bed. There is a large (67-100%) amount of necrotic tissue within the wound bed including Eschar. Wound #6 status is Healed - Epithelialized. Original cause of wound was Gradually Appeared. The wound is located on the Right,Dorsal Foot. The wound measures 0cm length x 0cm width x 0cm depth; 0cm^2 area and 0cm^3 volume. There is Fat Layer (Subcutaneous Tissue) Exposed exposed. There is no tunneling or undermining noted. There is a none present amount of drainage noted. The wound margin is flat and intact. There is no granulation within the wound bed. There is a large (67-100%) amount of necrotic tissue within the wound bed including Eschar. 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 My suggestion at this time is good to be that we go ahead and continue with the current wound care measures specifically with regard to preventing wounds going forward and this is good to be at maximum something that she is going to have to deal with on a daily basis. That includes wearing compression stockings, elevate her legs, and sleeping in the bed. 2. I do believe that she needs to monitor for any signs of infections and if she does develop anything then obviously she should let me know but at this point everything appears to be completely healed. We will see patient back for reevaluation in 1 week here in the clinic. If anything worsens or changes patient will contact our office for additional recommendations. Electronic Signature(s) Signed: 07/22/2019 1:35:39 PM By: Worthy Keeler PA-C Entered By: Worthy Keeler on 07/22/2019 13:35:38 Bonnie Kane ([id number]) -------------------------------------------------------------------------------- SuperBill Details Patient Name: Bonnie Kane Date of Service: 07/22/2019 Medical Record Number: [id number] Patient Account Number: [id number] Date of Birth/Sex: June 26, 1945 (73 y.o. F) Treating RN: Army Melia Primary Care Provider: Shawnie Dapper Other Clinician: Referring Provider: Shawnie Dapper Treating Provider/Extender: Bonnie Kane, Bonnie Kane Weeks in  Treatment: 8 Diagnosis Coding ICD-10 Codes Code Description I87.2  Venous insufficiency (chronic) (peripheral) I89.0 Lymphedema, not elsewhere classified I73.89 Other specified peripheral vascular diseases L97.821 Non-pressure chronic ulcer of other part of left lower leg limited to breakdown of skin L97.521 Non-pressure chronic ulcer of other part of left foot limited to breakdown of skin L97.512 Non-pressure chronic ulcer of other part of right foot with fat layer exposed I50.42 Chronic combined systolic (congestive) and diastolic (congestive) heart failure E11.621 Type 2 diabetes mellitus with foot ulcer Facility Procedures CPT4 Code: [id number] Description: 99213 - WOUND CARE VISIT-LEV 3 EST PT Modifier: Quantity: 1 Physician Procedures CPT4 Code: [id number] Description: [id number] - WC PHYS LEVEL 3 - EST PT Modifier: Quantity: 1 CPT4 Code: Description: ICD-10 Diagnosis Description I87.2 Venous insufficiency (chronic) (peripheral) I89.0 Lymphedema, not elsewhere classified I73.89 Other specified peripheral vascular diseases L97.821 Non-pressure chronic ulcer of other part of left lower leg  limited to breakd Modifier: own of skin Quantity: Electronic Signature(s) Signed: 07/22/2019 1:35:54 PM By: Worthy Keeler PA-C Entered By: Worthy Keeler on 07/22/2019 13:35:53

## 2019-07-29 ENCOUNTER — Encounter: Payer: Medicare Other | Admitting: Physician Assistant

## 2019-08-01 ENCOUNTER — Encounter: Payer: Medicare Other | Admitting: Physician Assistant

## 2019-08-01 ENCOUNTER — Other Ambulatory Visit: Payer: Self-pay

## 2019-08-01 DIAGNOSIS — L97822 Non-pressure chronic ulcer of other part of left lower leg with fat layer exposed: Secondary | ICD-10-CM | POA: Diagnosis not present

## 2019-08-01 DIAGNOSIS — I872 Venous insufficiency (chronic) (peripheral): Secondary | ICD-10-CM | POA: Diagnosis not present

## 2019-08-01 DIAGNOSIS — E11622 Type 2 diabetes mellitus with other skin ulcer: Secondary | ICD-10-CM | POA: Diagnosis not present

## 2019-08-01 DIAGNOSIS — E1151 Type 2 diabetes mellitus with diabetic peripheral angiopathy without gangrene: Secondary | ICD-10-CM | POA: Diagnosis not present

## 2019-08-01 DIAGNOSIS — L97821 Non-pressure chronic ulcer of other part of left lower leg limited to breakdown of skin: Secondary | ICD-10-CM | POA: Diagnosis not present

## 2019-08-01 DIAGNOSIS — I890 Lymphedema, not elsewhere classified: Secondary | ICD-10-CM | POA: Diagnosis not present

## 2019-08-01 DIAGNOSIS — E11621 Type 2 diabetes mellitus with foot ulcer: Secondary | ICD-10-CM | POA: Diagnosis not present

## 2019-08-01 DIAGNOSIS — L97521 Non-pressure chronic ulcer of other part of left foot limited to breakdown of skin: Secondary | ICD-10-CM | POA: Diagnosis not present

## 2019-08-01 DIAGNOSIS — I110 Hypertensive heart disease with heart failure: Secondary | ICD-10-CM | POA: Diagnosis not present

## 2019-08-01 DIAGNOSIS — L97512 Non-pressure chronic ulcer of other part of right foot with fat layer exposed: Secondary | ICD-10-CM | POA: Diagnosis not present

## 2019-08-01 NOTE — Progress Notes (Signed)
Bonnie Kane, Bonnie Kane ([id number]) Visit Report for 08/01/2019 Arrival Information Details Patient Name: Bonnie Kane, Bonnie Kane Date of Service: 08/01/2019 9:15 AM Medical Record Number: [id number] Patient Account Number: [id number] Date of Birth/Sex: [birth year] (74 y.o. F) Treating RN: Montey Hora Primary Care Haniyyah Sakuma: Shawnie Dapper Other Clinician: Referring Jossilyn Benda: Shawnie Dapper Treating Oakes Mccready/Extender: Melburn Hake, HOYT Weeks in Treatment: 9 Visit Information History Since Last Visit Added or deleted any medications: No Patient Arrived: [place] Any new allergies or adverse reactions: No Arrival Time: 09:07 Had a fall or experienced change in No Accompanied By: husband activities of daily living that may affect Transfer Assistance: None risk of falls: Patient Identification Verified: Yes Signs or symptoms of abuse/neglect since last visito No Secondary Verification Process Completed: Yes Hospitalized since last visit: No Implantable device outside of the clinic excluding No cellular tissue based products placed in the center since last visit: Has Compression in Place as Prescribed: No Pain Present Now: Yes Electronic Signature(s) Signed: 08/01/2019 3:40:49 PM By: Montey Hora Entered By: Montey Hora on 08/01/2019 09:09:46 Bonnie Kane ([id number]) -------------------------------------------------------------------------------- Clinic Level of Care Assessment Details Patient Name: Bonnie Kane Date of Service: 08/01/2019 9:15 AM Medical Record Number: [id number] Patient Account Number: [id number] Date of Birth/Sex: [birth year] (74 y.o. F) Treating RN: Army Melia Primary Care Arvada Seaborn: Shawnie Dapper Other Clinician: Referring Kase Shughart: Shawnie Dapper Treating Joaquin Knebel/Extender: Melburn Hake, HOYT Weeks in Treatment: 9 Clinic Level of Care Assessment Items TOOL 4 Quantity Score []  - Use when only an EandM is performed on FOLLOW-UP visit 0 ASSESSMENTS - Nursing Assessment /  Reassessment []  - Reassessment of Co-morbidities (includes updates in patient status) 0 []  - 0 Reassessment of Adherence to Treatment Plan ASSESSMENTS - Wound and Skin Assessment / Reassessment []  - Simple Wound Assessment / Reassessment - one wound 0 []  - 0 Complex Wound Assessment / Reassessment - multiple wounds []  - 0 Dermatologic / Skin Assessment (not related to wound area) ASSESSMENTS - Focused Assessment []  - Circumferential Edema Measurements - multi extremities 0 []  - 0 Nutritional Assessment / Counseling / Intervention []  - 0 Lower Extremity Assessment (monofilament, tuning fork, pulses) []  - 0 Peripheral Arterial Disease Assessment (using hand held doppler) ASSESSMENTS - Ostomy and/or Continence Assessment and Care []  - Incontinence Assessment and Management 0 []  - 0 Ostomy Care Assessment and Management (repouching, etc.) PROCESS - Coordination of Care []  - Simple Patient / Family Education for ongoing care 0 []  - 0 Complex (extensive) Patient / Family Education for ongoing care []  - 0 Staff obtains Programmer, systems, Records, Test Results / Process Orders []  - 0 Staff telephones HHA, Nursing Homes / Clarify orders / etc []  - 0 Routine Transfer to another Facility (non-emergent condition) []  - 0 Routine Hospital Admission (non-emergent condition) []  - 0 New Admissions / Biomedical engineer / Ordering NPWT, Apligraf, etc. []  - 0 Emergency Hospital Admission (emergent condition) []  - 0 Simple Discharge Coordination []  - 0 Complex (extensive) Discharge Coordination PROCESS - Special Needs []  - Pediatric / Minor Patient Management 0 []  - 0 Isolation Patient Management []  - 0 Hearing / Language / Visual special needs []  - 0 Assessment of Community assistance (transportation, D/C planning, etc.) Bonnie Kane, Bonnie Kane ([id number]) []  - 0 Additional assistance / Altered mentation []  - 0 Support Surface(s) Assessment (bed, cushion, seat, etc.) INTERVENTIONS - Wound  Cleansing / Measurement []  - Simple Wound Cleansing - one wound 0 []  - 0 Complex Wound Cleansing - multiple wounds []  - 0 Wound Imaging (photographs - any number of wounds) []  - 0  Wound Tracing (instead of photographs) []  - 0 Simple Wound Measurement - one wound []  - 0 Complex Wound Measurement - multiple wounds INTERVENTIONS - Wound Dressings []  - Small Wound Dressing one or multiple wounds 0 []  - 0 Medium Wound Dressing one or multiple wounds []  - 0 Large Wound Dressing one or multiple wounds []  - 0 Application of Medications - topical []  - 0 Application of Medications - injection INTERVENTIONS - Miscellaneous []  - External ear exam 0 []  - 0 Specimen Collection (cultures, biopsies, blood, body fluids, etc.) []  - 0 Specimen(s) / Culture(s) sent or taken to Lab for analysis []  - 0 Patient Transfer (multiple staff / Civil Service fast streamer / Similar devices) []  - 0 Simple Staple / Suture removal (25 or less) []  - 0 Complex Staple / Suture removal (26 or more) []  - 0 Hypo / Hyperglycemic Management (close monitor of Blood Glucose) []  - 0 Ankle / Brachial Index (ABI) - do not check if billed separately []  - 0 Vital Signs Has the patient been seen at the hospital within the last three years: Yes Total Score: 0 Level Of Care: ____ Electronic Signature(s) Signed: 08/01/2019 9:44:09 AM By: Army Melia Entered By: Army Melia on 08/01/2019 09:38:01 Bonnie Kane ([id number]) -------------------------------------------------------------------------------- Encounter Discharge Information Details Patient Name: Bonnie Kane Date of Service: 08/01/2019 9:15 AM Medical Record Number: [id number] Patient Account Number: [id number] Date of Birth/Sex: Mar 15, 1946 (75 y.o. F) Treating RN: Army Melia Primary Care Amylia Collazos: Shawnie Dapper Other Clinician: Referring Toshiko Kemler: Shawnie Dapper Treating Rabiah Goeser/Extender: Melburn Hake, HOYT Weeks in Treatment: 9 Encounter Discharge Information Items  Post Procedure Vitals Discharge Condition: Stable Temperature (F): 97.6 Ambulatory Status: Ambulatory Pulse (bpm): 80 Discharge Destination: Home Respiratory Rate (breaths/min): 16 Transportation: Private Auto Blood Pressure (mmHg): 138/66 Accompanied By: husband Schedule Follow-up Appointment: Yes Clinical Summary of Care: Electronic Signature(s) Signed: 08/01/2019 9:44:09 AM By: Army Melia Entered By: Army Melia on 08/01/2019 09:39:17 Bonnie Kane ([id number]) -------------------------------------------------------------------------------- Lower Extremity Assessment Details Patient Name: Bonnie Kane Date of Service: 08/01/2019 9:15 AM Medical Record Number: [id number] Patient Account Number: [id number] Date of Birth/Sex: 06-17-1945 (73 y.o. F) Treating RN: Montey Hora Primary Care Srihaan Mastrangelo: Shawnie Dapper Other Clinician: Referring Teruo Stilley: Shawnie Dapper Treating Mayerli Kirst/Extender: STONE III, HOYT Weeks in Treatment: 9 Edema Assessment Assessed: [Left: No] [Right: No] Edema: [Left: Ye] [Right: s] Calf Left: Right: Point of Measurement: 32 cm From Medial Instep 43 cm cm Ankle Left: Right: Point of Measurement: 10 cm From Medial Instep 25.5 cm cm Vascular Assessment Pulses: Dorsalis Pedis Palpable: [Left:Yes] Electronic Signature(s) Signed: 08/01/2019 3:40:49 PM By: Montey Hora Entered By: Montey Hora on 08/01/2019 09:18:19 Bonnie Kane ([id number]) -------------------------------------------------------------------------------- Multi Wound Chart Details Patient Name: Bonnie Kane Date of Service: 08/01/2019 9:15 AM Medical Record Number: [id number] Patient Account Number: [id number] Date of Birth/Sex: [birth year] (74 y.o. F) Treating RN: Army Melia Primary Care Rabiah Goeser: Shawnie Dapper Other Clinician: Referring Jamarrion Budai: Shawnie Dapper Treating Karita Dralle/Extender: STONE III, HOYT Weeks in Treatment: 9 Vital Signs Height(in): 64 Pulse(bpm):  80 Weight(lbs): 250 Blood Pressure(mmHg): 138/66 Body Mass Index(BMI): 43 Temperature(F): 97.6 Respiratory Rate(breaths/min): 18 Photos: [N/A:N/A] Wound Location: Left, Lateral Lower Leg N/A N/A Wounding Event: Trauma N/A N/A Primary Etiology: Venous Leg Ulcer N/A N/A Secondary Etiology: Skin Tear N/A N/A Comorbid History: Type II Diabetes, Osteoarthritis, N/A N/A Neuropathy, Received Chemotherapy, Received Radiation Date Acquired: 07/29/2019 N/A N/A Weeks of Treatment: 0 N/A N/A Wound Status: Open N/A N/A Measurements L x W x D (cm) 2.9x1.8x0.1 N/A N/A Area (cm) : 4.1 N/A N/A Volume (  cm) : 0.41 N/A N/A Classification: Full Thickness Without Exposed N/A N/A Support Structures Exudate Amount: Medium N/A N/A Exudate Type: Serous N/A N/A Exudate Color: amber N/A N/A Wound Margin: Flat and Intact N/A N/A Granulation Amount: Large (67-100%) N/A N/A Granulation Quality: Pink N/A N/A Necrotic Amount: None Present (0%) N/A N/A Exposed Structures: Fat Layer (Subcutaneous Tissue) N/A N/A Exposed: Yes Fascia: No Tendon: No Muscle: No Joint: No Bone: No Epithelialization: None N/A N/A Treatment Notes Electronic Signature(s) Signed: 08/01/2019 9:44:09 AM By: Army Melia Entered By: Army Melia on 08/01/2019 09:26:19 Bonnie Kane ([id number]) Bonnie Kane ([id number]) -------------------------------------------------------------------------------- Multi-Disciplinary Care Plan Details Patient Name: Bonnie Kane Date of Service: 08/01/2019 9:15 AM Medical Record Number: [id number] Patient Account Number: [id number] Date of Birth/Sex: 04-28-1945 (74 y.o. F) Treating RN: Army Melia Primary Care Deshannon Seide: Shawnie Dapper Other Clinician: Referring Aadon Gorelik: Shawnie Dapper Treating Tiffney Haughton/Extender: STONE III, HOYT Weeks in Treatment: 9 Active Inactive Abuse / Safety / Falls / Self Care Management Nursing Diagnoses: Potential for falls Goals: Patient will remain injury free  related to falls Date Initiated: 05/24/2019 Target Resolution Date: 08/24/2019 Goal Status: Active Interventions: Assess fall risk on admission and as needed Notes: Pain, Acute or Chronic Nursing Diagnoses: Pain, acute or chronic: actual or potential Goals: Patient/caregiver will verbalize adequate pain control between visits Date Initiated: 05/24/2019 Target Resolution Date: 08/24/2019 Goal Status: Active Interventions: Complete pain assessment as per visit requirements Notes: Venous Leg Ulcer Nursing Diagnoses: Potential for venous Insuffiency (use before diagnosis confirmed) Goals: Patient will maintain optimal edema control Date Initiated: 05/24/2019 Target Resolution Date: 08/24/2019 Goal Status: Active Interventions: Assess peripheral edema status every visit. Notes: Wound/Skin Impairment Nursing Diagnoses: Impaired tissue integrity Bonnie Kane, Bonnie Kane ([id number]) Goals: Ulcer/skin breakdown will heal within 14 weeks Date Initiated: 05/24/2019 Target Resolution Date: 08/24/2019 Goal Status: Active Interventions: Assess patient/caregiver ability to obtain necessary supplies Assess patient/caregiver ability to perform ulcer/skin care regimen upon admission and as needed Assess ulceration(s) every visit Notes: Electronic Signature(s) Signed: 08/01/2019 9:44:09 AM By: Army Melia Entered By: Army Melia on 08/01/2019 Bonnie Kane, Bonnie Kane ([id number]) -------------------------------------------------------------------------------- Pain Assessment Details Patient Name: Bonnie Kane Date of Service: 08/01/2019 9:15 AM Medical Record Number: [id number] Patient Account Number: [id number] Date of Birth/Sex: 10-21-1945 (74 y.o. F) Treating RN: Montey Hora Primary Care Marg Macmaster: Shawnie Dapper Other Clinician: Referring Onix Jumper: Shawnie Dapper Treating Edda Orea/Extender: STONE III, HOYT Weeks in Treatment: 9 Active Problems Location of Pain Severity and Description of Pain Patient  Has Paino Yes Site Locations Pain Location: Pain in Ulcers With Dressing Change: Yes Pain Management and Medication Current Pain Management: Electronic Signature(s) Signed: 08/01/2019 3:40:49 PM By: Montey Hora Entered By: Montey Hora on 08/01/2019 09:13:29 Bonnie Kane ([id number]) -------------------------------------------------------------------------------- Patient/Caregiver Education Details Patient Name: Bonnie Kane Date of Service: 08/01/2019 9:15 AM Medical Record Number: [id number] Patient Account Number: [id number] Date of Birth/Gender: [birth year] (73 y.o. F) Treating RN: Army Melia Primary Care Physician: Shawnie Dapper Other Clinician: Referring Physician: Shawnie Dapper Treating Physician/Extender: Sharalyn Ink in Treatment: 9 Education Assessment Education Provided To: Patient Education Topics Provided Wound/Skin Impairment: Handouts: Caring for Your Ulcer Methods: Demonstration, Explain/Verbal Responses: State content correctly Electronic Signature(s) Signed: 08/01/2019 9:44:09 AM By: Army Melia Entered By: Army Melia on 08/01/2019 09:38:19 Bonnie Kane ([id number]) -------------------------------------------------------------------------------- Wound Assessment Details Patient Name: Bonnie Kane Date of Service: 08/01/2019 9:15 AM Medical Record Number: [id number] Patient Account Number: [id number] Date of Birth/Sex: Jun 21, 1945 (73 y.o. F) Treating RN: Montey Hora Primary Care Latica Hohmann: Shawnie Dapper Other Clinician: Referring Prestyn Mahn: Shawnie Dapper Treating Carter Kaman/Extender:  STONE III, HOYT Weeks in Treatment: 9 Wound Status Wound Number: 7 Primary Venous Leg Ulcer Etiology: Wound Location: Left, Lateral Lower Leg Secondary Skin Tear Wounding Event: Trauma Etiology: Date Acquired: 07/29/2019 Wound Status: Open Weeks Of Treatment: 0 Comorbid Type II Diabetes, Osteoarthritis, Neuropathy, Received Clustered Wound: No History:  Chemotherapy, Received Radiation Photos Wound Measurements Length: (cm) 2.9 Width: (cm) 1.8 Depth: (cm) 0.1 Area: (cm) 4.1 Volume: (cm) 0.41 % Reduction in Area: % Reduction in Volume: Epithelialization: None Tunneling: No Undermining: No Wound Description Classification: Full Thickness Without Exposed Support Structu Wound Margin: Flat and Intact Exudate Amount: Medium Exudate Type: Serous Exudate Color: amber res Foul Odor After Cleansing: No Slough/Fibrino No Wound Bed Granulation Amount: Large (67-100%) Exposed Structure Granulation Quality: Pink Fascia Exposed: No Necrotic Amount: None Present (0%) Fat Layer (Subcutaneous Tissue) Exposed: Yes Tendon Exposed: No Muscle Exposed: No Joint Exposed: No Bone Exposed: No Treatment Notes Wound #7 (Left, Lateral Lower Leg) Notes xeroform, ABD, conform, tape, tubi Electronic Signature(s) Bonnie Kane, Bonnie Kane ([id number]) Signed: 08/01/2019 3:40:49 PM By: Montey Hora Entered By: Montey Hora on 08/01/2019 09:16:27 Bonnie Kane ([id number]) -------------------------------------------------------------------------------- Vitals Details Patient Name: Bonnie Kane Date of Service: 08/01/2019 9:15 AM Medical Record Number: [id number] Patient Account Number: [id number] Date of Birth/Sex: [birth year] (75 y.o. F) Treating RN: Montey Hora Primary Care Ananias Kolander: Shawnie Dapper Other Clinician: Referring Kenyetta Fife: Shawnie Dapper Treating Anneke Cundy/Extender: STONE III, HOYT Weeks in Treatment: 9 Vital Signs Time Taken: 09:12 Temperature (F): 97.6 Height (in): 64 Pulse (bpm): 80 Weight (lbs): 250 Respiratory Rate (breaths/min): 18 Body Mass Index (BMI): 42.9 Blood Pressure (mmHg): 138/66 Reference Range: 80 - 120 mg / dl Electronic Signature(s) Signed: 08/01/2019 3:40:49 PM By: Montey Hora Entered By: Montey Hora on 08/01/2019 09:13:15

## 2019-08-01 NOTE — Progress Notes (Addendum)
LASHANTE, GIBBON ([id number]) Visit Report for 08/01/2019 Chief Complaint Document Details Patient Name: Bonnie Kane, Bonnie Kane Date of Service: 08/01/2019 9:15 AM Medical Record Number: [id number] Patient Account Number: [id number] Date of Birth/Sex: Oct 22, 1945 (74 y.o. F) Treating RN: Army Melia Primary Care Provider: Shawnie Dapper Other Clinician: Referring Provider: Shawnie Dapper Treating Provider/Extender: Melburn Hake, HOYT Weeks in Treatment: 9 Information Obtained from: Patient Chief Complaint Left LE Ulcer Electronic Signature(s) Signed: 08/01/2019 9:21:03 AM By: Worthy Keeler PA-C Entered By: Worthy Keeler on 08/01/2019 09:21:02 Bonnie Kane ([id number]) -------------------------------------------------------------------------------- Debridement Details Patient Name: Bonnie Kane Date of Service: 08/01/2019 9:15 AM Medical Record Number: [id number] Patient Account Number: [id number] Date of Birth/Sex: May 27, 1945 (74 y.o. F) Treating RN: Army Melia Primary Care Provider: Shawnie Dapper Other Clinician: Referring Provider: Shawnie Dapper Treating Provider/Extender: Melburn Hake, HOYT Weeks in Treatment: 9 Debridement Performed for Wound #7 Left,Lateral Lower Leg Assessment: Performed By: Physician STONE III, HOYT E., PA-C Debridement Type: Chemical/Enzymatic/Mechanical Agent Used: saline and gauze Severity of Tissue Pre Debridement: Fat layer exposed Level of Consciousness (Pre- Awake and Alert procedure): Pre-procedure Verification/Time Out Yes - 09:35 Taken: Start Time: 09:36 Pain Control: Lidocaine Instrument: Other : saline and gauze Bleeding: None End Time: 09:37 Response to Treatment: Procedure was tolerated well Level of Consciousness (Post- Awake and Alert procedure): Post Debridement Measurements of Total Wound Length: (cm) 2.9 Width: (cm) 1.8 Depth: (cm) 0.1 Volume: (cm) 0.41 Character of Wound/Ulcer Post Debridement: Stable Severity of Tissue Post  Debridement: Fat layer exposed Post Procedure Diagnosis Same as Pre-procedure Electronic Signature(s) Signed: 08/06/2019 10:24:09 AM By: Sharon Mt Signed: 08/07/2019 4:35:57 PM By: Army Melia Signed: 08/16/2019 5:05:16 PM By: Worthy Keeler PA-C Previous Signature: 08/01/2019 9:44:09 AM Version By: Army Melia Previous Signature: 08/01/2019 5:42:16 PM Version By: Worthy Keeler PA-C Entered By: Sharon Mt on 08/06/2019 10:24:08 Bonnie Kane ([id number]) -------------------------------------------------------------------------------- HPI Details Patient Name: Bonnie Kane Date of Service: 08/01/2019 9:15 AM Medical Record Number: [id number] Patient Account Number: [id number] Date of Birth/Sex: May 27, 1945 (74 y.o. F) Treating RN: Army Melia Primary Care Provider: Shawnie Dapper Other Clinician: Referring Provider: Shawnie Dapper Treating Provider/Extender: Melburn Hake, HOYT Weeks in Treatment: 9 History of Present Illness HPI Description: 05/24/19 patient presents today for initial evaluation here in our clinic as a referral from Dr. Julien Nordmann who is her family medicine practitioner. He has been taking care of this patient for several weeks now it appears. At least since it appears December 2020 although she has had some issue for quite some time. She has seen the vascular surgeon at [place] and did have ABIs and TBI's which were performed back at that point. That was in September 2020. With that being said unfortunately she did not show signs of low ABIs and TBI's on the left which is where the blistering is taking place at this point her right seem to be in good shape as far as the results were concerned. She had a right ABI of 1.17 with a TBI of 0.85 and a left ABI of 0.68 with a TBI of 0.43. This is at least moderate disease. The patient does have diabetes although apparently this is diet controlled according to what they tell me at this point. She does also have  congestive heart failure, bilateral lower extremity edema with a history of chronic venous stasis, systolic heart murmur, hypertension, and appears to have also had a transcatheter aortic valve replacement. Was on April 2018. The patient has been on doxycycline initially by Dr. Ernestine Conrad  and then subsequently this was switched just yesterday to Cipro. He does note in his note yesterday that she had improved but he still recommended that she come to the wound care center for further evaluation and treatment. He did also on top given her the Cipro to cover the Serratia give her Vicodin for helping with the pain he also performed a basic metabolic panel and a CBC yesterday as well. He does have a history of having been recommended compression stockings in the past although apparently this ripped skin off when they remove them according to the patient and her husband. He is present during the evaluation today as well. Is also do CBC which actually appear to be doing fairly well the white blood cell count was 7.0 and appears normal. The metabolic panel did show an elevation in the BUN and creatinine as well as the ratio at this point her glucose was 231 not be fasting but still that is an elevation range. Right now the patient is taking 40 mg of Lasix in the morning and 20 mg in the evening. He is also get a recheck the metabolic panel in a week. He notes that her kidney function was just a little bit down compared to her baseline. His most recent hemoglobin A1c was 7.2 on October 25, 2018. That was found in care everywhere from [place]. Patient's wound culture was actually performed just 4 days ago and again did show Serratia as the causative organism for the infection currently and obviously doxycycline is not to work for this that is why she was switched to Cipro. 05/31/2019 upon evaluation today patient appears to be doing well at this time with regard to her wounds on the left lower  extremity. Several of the blisters that she did have have also significantly improved. These have ruptured but do not appear to be spreading and a lot of the tissue though I think will come off I do not think we have to rush as far as this is concerned she is having a lot of discomfort still that really is disproportionate even to her wounds which seem to be improving. She did get the Farrow wrap although her husband states that she is not good to be able to wear the sock portion that goes underneath as it has something covering her toes she apparently is unable to wear anything that covers her toes. He states he is been married for close to 50 years and is never seen her where anything that even resembles a sock. 06/10/2019 upon evaluation today patient appears to be doing well with regard to her wounds at this point. She is still experiencing significant pain currently that I do believe is more neuropathic in nature I discussed this previous. Her wounds are healing quite nicely and to be honest her pain is disproportionate to the open wounds that she actually has present at this point she also does not appear to be showing any signs of infection. She does have numbness in her toes and then subsequently with regard to her wounds and even some areas that are not even open she has significant pain which I would describe is more neuropathic/nerve in nature. This was discussed with her and her husband yet again today. I did explain again today that I do not believe simply performing venous ablation is good to take care of this pain either. I did discuss possible options including since she has had issues in the past with other  medications possibly trying Cymbalta her twin brother actually has a similar issue with neuropathy and neuropathic pain and takes Cymbalta with good result. I think that is at least a possibility for her to be honest. She could talk with her primary care provider about it we do not  prescribe that medication at the wound center just due to the nature of follow-up this necessary for that medicine. 06/18/2019 upon evaluation today patient appears to be doing well with regard to her wounds in general. I am extremely pleased with the progress that she is made and in fact her wounds seem to be healing quite nicely with compression. With that being said I do believe that she is going need something ongoing as far as compression is concerned. If she does not have ongoing compression then she will obviously revert back to where things were in the end. Nonetheless I do think at minimum she may need to have Tubigrip that she wears ongoing. I would discuss this in greater detail with her next week. For the time being we are more focused on trying to get her wounds healed and again she is definitely doing much better in that regard. Unfortunately I am told that when they change this that she has excruciating pain for 8 hours I really not sure why she will be having this pain obviously there is no reason that the silver alginate really should be causing pain like this and again her pain again appears to be disproportionate to the wounds at this point especially considering there healing so well. For that reason I really think that this may be Morbid just changing the dressings in general. 06/24/2019 upon evaluation today patient appears to be doing very well with regard to her wounds. She is making some progress at all wound locations. With that being said I think she may do better switching to a collagen-based dressing also think that she would probably do better with regard to her wounds we were able to debride the wounds in order to clear off the slough and biofilm that I think is slowing down the healing process. The patient is in agreement with giving this a try after some discussion of how this could benefit her. She is in agreement with that. I therefore did proceed after obtaining consent  with sharp debridement today. 07/01/2019 upon evaluation today patient appears to be doing well with regard to her wounds. She is making some progress which is great news. Fortunately there is no evidence of active infection at this time. With that being said the patient is still having significant pain which I believe is really unrelated to the wounds themselves she hurts even over the areas of her skin where there is no pain including her feet I believe this is all related more to neuropathy. She does not see a specialist for this currently she is on Cymbalta however. Unfortunately her wounds are not closed yet although they are better after cleaning things away last week. There still is some work to do this week as well as far as keeping the area free and clear. Fortunately there is no sign in general of active infection at this point. No fevers, chills, nausea, vomiting, or diarrhea. 07/08/2019 upon evaluation today patient appears to be doing better for the most part compared to previous evaluation. With that being said she does have some of the collagen/dressing material stuck to the wound beds of the left lower extremity. This fortunately was able to be removed  by way of sharp debridement without damaging any of the good healing underneath at both locations. With that being said there fortunately is no Keeran, Aadhira ([id number]) signs of active infection at this time which is great news. No fever chills noted. The patient apparently has not been putting any moisture on the dressings before putting them on the leg area. Subsequently this is 1 reason why it probably dried out although I think the other is actually that to be honest the wounds are not draining nearly as much as they used to simply due to the fact that she is healing quite well. I am really not sure why they quit using any moisture on the dressings as far as saline was concerned other than they tell us that we told him not to but that  was not the case. 07/15/2019 upon evaluation today patient actually appears to be doing excellent with regard to her wounds. She has been tolerating the dressing changes actually quite well without complication will be using collagen the only issue here is that she continues to build up collagen over the wounds where this is getting somewhat dry. I think this is the fact related to the fact that she is actually healing and therefore having much less drainage which is keeping the collagen from becoming as moist and dissolving as we would like to have seen. 07/22/2019 upon evaluation today patient appears to be doing extremely well with regard to her legs. In fact everything appears to be healed at this point which is great news. There is no signs of active infection and overall I am extremely happy with the progress that she has made. The patient does have her compression stockings with her today which is excellent news as well. 08/01/2019 unfortunately after being discharged completely healed the patient comes back today with what appears to be a ruptured blister on the lateral portion of her left lower extremity. She has 2 other blisters that she states were noted on this past Monday when she removed her compression stocking. This was after she had been up on her feet for a lot of the time during that day or the day before I cannot remember the exact scenario. Either way I do believe that she had some blistering and then when she removed the compression sock it pulled back the skin that had blistered. Either way this is very superficial but nonetheless this is the issue she tends to have with compression stockings in general her and her husband tell me today. That is why they have been reluctant to wear them which I understand. That is why we initially try the Velcro compression wrap but she was not happy with that either. I explained that at some level she is can have to have some chronic compression  to keep this under control. Electronic Signature(s) Signed: 08/01/2019 5:36:19 PM By: Worthy Keeler PA-C Entered By: Worthy Keeler on 08/01/2019 17:36:19 Bonnie Kane, Bonnie Kane ([id number]) -------------------------------------------------------------------------------- Physical Exam Details Patient Name: Bonnie Kane Date of Service: 08/01/2019 9:15 AM Medical Record Number: [id number] Patient Account Number: [id number] Date of Birth/Sex: 03-20-1946 (73 y.o. F) Treating RN: Army Melia Primary Care Provider: Shawnie Dapper Other Clinician: Referring Provider: Shawnie Dapper Treating Provider/Extender: STONE III, HOYT Weeks in Treatment: 9 Constitutional Well-nourished and well-hydrated in no acute distress. Respiratory normal breathing without difficulty. Psychiatric this patient is able to make decisions and demonstrates good insight into disease process. Alert and Oriented x 3. pleasant and cooperative. Notes Patient's wound bed  currently showed signs of good granulation at this time and there does not appear to be any evidence of active infection which is good news. Overall this is very superficial wound and I think will heal quite nicely the biggest thing is she tends to dry out a lot and then that causes a lot of discomfort I would recommend Xeroform as the treatment of choice here. Electronic Signature(s) Signed: 08/01/2019 5:36:43 PM By: Worthy Keeler PA-C Entered By: Worthy Keeler on 08/01/2019 17:36:42 Bonnie Kane ([id number]) -------------------------------------------------------------------------------- Physician Orders Details Patient Name: Bonnie Kane Date of Service: 08/01/2019 9:15 AM Medical Record Number: [id number] Patient Account Number: [id number] Date of Birth/Sex: 1946-04-10 (73 y.o. F) Treating RN: Army Melia Primary Care Provider: Shawnie Dapper Other Clinician: Referring Provider: Shawnie Dapper Treating Provider/Extender: Melburn Hake, HOYT Weeks in  Treatment: 9 Verbal / Phone Orders: No Diagnosis Coding ICD-10 Coding Code Description I87.2 Venous insufficiency (chronic) (peripheral) I89.0 Lymphedema, not elsewhere classified I73.89 Other specified peripheral vascular diseases L97.821 Non-pressure chronic ulcer of other part of left lower leg limited to breakdown of skin I50.42 Chronic combined systolic (congestive) and diastolic (congestive) heart failure E11.621 Type 2 diabetes mellitus with foot ulcer Wound Cleansing Wound #7 Left,Lateral Lower Leg o Clean wound with Normal Saline. Primary Wound Dressing Wound #7 Left,Lateral Lower Leg o Xeroform Secondary Dressing Wound #7 Left,Lateral Lower Leg o ABD and Kerlix/Conform Dressing Change Frequency Wound #7 Left,Lateral Lower Leg o Change dressing every day. Follow-up Appointments Wound #7 Left,Lateral Lower Leg o Return Appointment in 1 week. Edema Control o Elevate legs to the level of the heart and pump ankles as often as possible o Other: - Tubi grip F Discharge From [facility] o Discharge from [place] complete Electronic Signature(s) Signed: 08/01/2019 9:44:09 AM By: Army Melia Signed: 08/01/2019 5:42:16 PM By: Worthy Keeler PA-C Entered By: Army Melia on 08/01/2019 09:35:40 Bonnie Kane ([id number]) -------------------------------------------------------------------------------- Problem List Details Patient Name: Bonnie Kane Date of Service: 08/01/2019 9:15 AM Medical Record Number: [id number] Patient Account Number: [id number] Date of Birth/Sex: 09/12/1945 (73 y.o. F) Treating RN: Army Melia Primary Care Provider: Shawnie Dapper Other Clinician: Referring Provider: Shawnie Dapper Treating Provider/Extender: Melburn Hake, HOYT Weeks in Treatment: 9 Active Problems ICD-10 Evaluated Encounter Code Description Active Date Today Diagnosis I87.2 Venous insufficiency (chronic) (peripheral) 05/24/2019 No Yes I89.0  Lymphedema, not elsewhere classified 05/24/2019 No Yes I73.89 Other specified peripheral vascular diseases 05/24/2019 No Yes L97.821 Non-pressure chronic ulcer of other part of left lower leg limited to 05/24/2019 No Yes breakdown of skin I50.42 Chronic combined systolic (congestive) and diastolic (congestive) heart 05/24/2019 No Yes failure E11.621 Type 2 diabetes mellitus with foot ulcer 05/24/2019 No Yes Inactive Problems Resolved Problems ICD-10 Code Description Active Date Resolved Date L97.521 Non-pressure chronic ulcer of other part of left foot limited to breakdown of 05/24/2019 05/24/2019 skin L97.512 Non-pressure chronic ulcer of other part of right foot with fat layer exposed 06/10/2019 06/10/2019 Electronic Signature(s) Signed: 08/01/2019 9:20:44 AM By: Worthy Keeler PA-C Entered By: Worthy Keeler on 08/01/2019 09:20:44 Bonnie Kane ([id number]) -------------------------------------------------------------------------------- Progress Note Details Patient Name: Bonnie Kane Date of Service: 08/01/2019 9:15 AM Medical Record Number: [id number] Patient Account Number: [id number] Date of Birth/Sex: May 05, 1945 (73 y.o. F) Treating RN: Army Melia Primary Care Provider: Shawnie Dapper Other Clinician: Referring Provider: Shawnie Dapper Treating Provider/Extender: Melburn Hake, HOYT Weeks in Treatment: 9 Subjective Chief Complaint Information obtained from Patient Left LE Ulcer History of Present Illness (HPI) 05/24/19 patient presents today  for initial evaluation here in our clinic as a referral from Dr. Julien Nordmann who is her family medicine practitioner. He has been taking care of this patient for several weeks now it appears. At least since it appears December 2020 although she has had some issue for quite some time. She has seen the vascular surgeon at [place] and did have ABIs and TBI's which were performed back at that point. That was in September 2020. With that  being said unfortunately she did not show signs of low ABIs and TBI's on the left which is where the blistering is taking place at this point her right seem to be in good shape as far as the results were concerned. She had a right ABI of 1.17 with a TBI of 0.85 and a left ABI of 0.68 with a TBI of 0.43. This is at least moderate disease. The patient does have diabetes although apparently this is diet controlled according to what they tell me at this point. She does also have congestive heart failure, bilateral lower extremity edema with a history of chronic venous stasis, systolic heart murmur, hypertension, and appears to have also had a transcatheter aortic valve replacement. Was on April 2018. The patient has been on doxycycline initially by Dr. Ernestine Conrad and then subsequently this was switched just yesterday to Cipro. He does note in his note yesterday that she had improved but he still recommended that she come to the wound care center for further evaluation and treatment. He did also on top given her the Cipro to cover the Serratia give her Vicodin for helping with the pain he also performed a basic metabolic panel and a CBC yesterday as well. He does have a history of having been recommended compression stockings in the past although apparently this ripped skin off when they remove them according to the patient and her husband. He is present during the evaluation today as well. Is also do CBC which actually appear to be doing fairly well the white blood cell count was 7.0 and appears normal. The metabolic panel did show an elevation in the BUN and creatinine as well as the ratio at this point her glucose was 231 not be fasting but still that is an elevation range. Right now the patient is taking 40 mg of Lasix in the morning and 20 mg in the evening. He is also get a recheck the metabolic panel in a week. He notes that her kidney function was just a little bit down compared to her baseline. His  most recent hemoglobin A1c was 7.2 on October 25, 2018. That was found in care everywhere from [place]. Patient's wound culture was actually performed just 4 days ago and again did show Serratia as the causative organism for the infection currently and obviously doxycycline is not to work for this that is why she was switched to Cipro. 05/31/2019 upon evaluation today patient appears to be doing well at this time with regard to her wounds on the left lower extremity. Several of the blisters that she did have have also significantly improved. These have ruptured but do not appear to be spreading and a lot of the tissue though I think will come off I do not think we have to rush as far as this is concerned she is having a lot of discomfort still that really is disproportionate even to her wounds which seem to be improving. She did get the Farrow wrap although her husband  states that she is not good to be able to wear the sock portion that goes underneath as it has something covering her toes she apparently is unable to wear anything that covers her toes. He states he is been married for close to 50 years and is never seen her where anything that even resembles a sock. 06/10/2019 upon evaluation today patient appears to be doing well with regard to her wounds at this point. She is still experiencing significant pain currently that I do believe is more neuropathic in nature I discussed this previous. Her wounds are healing quite nicely and to be honest her pain is disproportionate to the open wounds that she actually has present at this point she also does not appear to be showing any signs of infection. She does have numbness in her toes and then subsequently with regard to her wounds and even some areas that are not even open she has significant pain which I would describe is more neuropathic/nerve in nature. This was discussed with her and her husband yet again today. I did explain  again today that I do not believe simply performing venous ablation is good to take care of this pain either. I did discuss possible options including since she has had issues in the past with other medications possibly trying Cymbalta her twin brother actually has a similar issue with neuropathy and neuropathic pain and takes Cymbalta with good result. I think that is at least a possibility for her to be honest. She could talk with her primary care provider about it we do not prescribe that medication at the wound center just due to the nature of follow-up this necessary for that medicine. 06/18/2019 upon evaluation today patient appears to be doing well with regard to her wounds in general. I am extremely pleased with the progress that she is made and in fact her wounds seem to be healing quite nicely with compression. With that being said I do believe that she is going need something ongoing as far as compression is concerned. If she does not have ongoing compression then she will obviously revert back to where things were in the end. Nonetheless I do think at minimum she may need to have Tubigrip that she wears ongoing. I would discuss this in greater detail with her next week. For the time being we are more focused on trying to get her wounds healed and again she is definitely doing much better in that regard. Unfortunately I am told that when they change this that she has excruciating pain for 8 hours I really not sure why she will be having this pain obviously there is no reason that the silver alginate really should be causing pain like this and again her pain again appears to be disproportionate to the wounds at this point especially considering there healing so well. For that reason I really think that this may be Morbid just changing the dressings in general. 06/24/2019 upon evaluation today patient appears to be doing very well with regard to her wounds. She is making some progress at all  wound locations. With that being said I think she may do better switching to a collagen-based dressing also think that she would probably do better with regard to her wounds we were able to debride the wounds in order to clear off the slough and biofilm that I think is slowing down the healing process. The patient is in agreement with giving this a try after some discussion of how  this could benefit her. She is in agreement with that. I therefore did proceed after obtaining consent with sharp debridement today. 07/01/2019 upon evaluation today patient appears to be doing well with regard to her wounds. She is making some progress which is great news. Fortunately there is no evidence of active infection at this time. With that being said the patient is still having significant pain which I believe is really unrelated to the wounds themselves she hurts even over the areas of her skin where there is no pain including her feet I believe this is all related more to neuropathy. She does not see a specialist for this currently she is on Cymbalta however. Unfortunately her wounds are not closed yet although they are better after cleaning things away last week. There still is some work to do this week as well as far as keeping the area free Bonnie Kane, Bonnie Kane ([id number]) and clear. Fortunately there is no sign in general of active infection at this point. No fevers, chills, nausea, vomiting, or diarrhea. 07/08/2019 upon evaluation today patient appears to be doing better for the most part compared to previous evaluation. With that being said she does have some of the collagen/dressing material stuck to the wound beds of the left lower extremity. This fortunately was able to be removed by way of sharp debridement without damaging any of the good healing underneath at both locations. With that being said there fortunately is no signs of active infection at this time which is great news. No fever chills noted. The patient  apparently has not been putting any moisture on the dressings before putting them on the leg area. Subsequently this is 1 reason why it probably dried out although I think the other is actually that to be honest the wounds are not draining nearly as much as they used to simply due to the fact that she is healing quite well. I am really not sure why they quit using any moisture on the dressings as far as saline was concerned other than they tell us that we told him not to but that was not the case. 07/15/2019 upon evaluation today patient actually appears to be doing excellent with regard to her wounds. She has been tolerating the dressing changes actually quite well without complication will be using collagen the only issue here is that she continues to build up collagen over the wounds where this is getting somewhat dry. I think this is the fact related to the fact that she is actually healing and therefore having much less drainage which is keeping the collagen from becoming as moist and dissolving as we would like to have seen. 07/22/2019 upon evaluation today patient appears to be doing extremely well with regard to her legs. In fact everything appears to be healed at this point which is great news. There is no signs of active infection and overall I am extremely happy with the progress that she has made. The patient does have her compression stockings with her today which is excellent news as well. 08/01/2019 unfortunately after being discharged completely healed the patient comes back today with what appears to be a ruptured blister on the lateral portion of her left lower extremity. She has 2 other blisters that she states were noted on this past Monday when she removed her compression stocking. This was after she had been up on her feet for a lot of the time during that day or the day before I cannot remember the exact scenario.  Either way I do believe that she had some blistering and then when  she removed the compression sock it pulled back the skin that had blistered. Either way this is very superficial but nonetheless this is the issue she tends to have with compression stockings in general her and her husband tell me today. That is why they have been reluctant to wear them which I understand. That is why we initially try the Velcro compression wrap but she was not happy with that either. I explained that at some level she is can have to have some chronic compression to keep this under control. Objective Constitutional Well-nourished and well-hydrated in no acute distress. Vitals Time Taken: 9:12 AM, Height: 64 in, Weight: 250 lbs, BMI: 42.9, Temperature: 97.6 F, Pulse: 80 bpm, Respiratory Rate: 18 breaths/min, Blood Pressure: 138/66 mmHg. Respiratory normal breathing without difficulty. Psychiatric this patient is able to make decisions and demonstrates good insight into disease process. Alert and Oriented x 3. pleasant and cooperative. General Notes: Patient's wound bed currently showed signs of good granulation at this time and there does not appear to be any evidence of active infection which is good news. Overall this is very superficial wound and I think will heal quite nicely the biggest thing is she tends to dry out a lot and then that causes a lot of discomfort I would recommend Xeroform as the treatment of choice here. Integumentary (Hair, Skin) Wound #7 status is Open. Original cause of wound was Trauma. The wound is located on the Left,Lateral Lower Leg. The wound measures 2.9cm length x 1.8cm width x 0.1cm depth; 4.1cm^2 area and 0.41cm^3 volume. There is Fat Layer (Subcutaneous Tissue) Exposed exposed. There is no tunneling or undermining noted. There is a medium amount of serous drainage noted. The wound margin is flat and intact. There is large (67- 100%) pink granulation within the wound bed. There is no necrotic tissue within the wound bed. Assessment Active  Problems ICD-10 Venous insufficiency (chronic) (peripheral) Lymphedema, not elsewhere classified Other specified peripheral vascular diseases Non-pressure chronic ulcer of other part of left lower leg limited to breakdown of skin Chronic combined systolic (congestive) and diastolic (congestive) heart failure Type 2 diabetes mellitus with foot ulcer Bonnie Kane, Bonnie Kane ([id number]) Procedures Wound #7 Pre-procedure diagnosis of Wound #7 is a Venous Leg Ulcer located on the Left,Lateral Lower Leg .Severity of Tissue Pre Debridement is: Fat layer exposed. There was a Chemical/Enzymatic/Mechanical debridement performed by STONE III, HOYT E., PA-C. With the following instrument (s): saline and gauze after achieving pain control using Lidocaine. Other agent used was saline and gauze. A time out was conducted at 09:35, prior to the start of the procedure. There was no bleeding. The procedure was tolerated well. Post Debridement Measurements: 2.9cm length x 1.8cm width x 0.1cm depth; 0.41cm^3 volume. Character of Wound/Ulcer Post Debridement is stable. Severity of Tissue Post Debridement is: Fat layer exposed. Post procedure Diagnosis Wound #7: Same as Pre-Procedure Plan Wound Cleansing: Wound #7 Left,Lateral Lower Leg: Clean wound with Normal Saline. Primary Wound Dressing: Wound #7 Left,Lateral Lower Leg: Xeroform Secondary Dressing: Wound #7 Left,Lateral Lower Leg: ABD and Kerlix/Conform Dressing Change Frequency: Wound #7 Left,Lateral Lower Leg: Change dressing every day. Follow-up Appointments: Wound #7 Left,Lateral Lower Leg: Return Appointment in 1 week. Edema Control: Elevate legs to the level of the heart and pump ankles as often as possible Other: - Tubi grip F Discharge From [facility] Services: Discharge from [place] complete 1. We will initiate treatment  with Xeroform to the open wound areas. We will continue to monitor the blistered regions and again we will get a  use Tubigrip on her right now as that seems to be the one thing that she states gives her compression but does not cause any problems. 2. With regard to ongoing compression explained that she is going to need to have something ongoing that she utilizes. Even if this ends up being Tubigrip that she just wears on a daily basis. Really I think a Velcro compression wrap would be ideal but she really does not like that at all. They did order a larger size of compression socks to try from [facility] at [facility] they are good to be arriving in the next day or so and she is good to see how that does on the other leg and if that something she feels like she can continue with. We will see patient back for reevaluation in 1 week here in the clinic. If anything worsens or changes patient will contact our office for additional recommendations. Electronic Signature(s) Signed: 08/01/2019 5:37:44 PM By: Worthy Keeler PA-C Entered By: Worthy Keeler on 08/01/2019 17:37:44 Bonnie Kane ([id number]) -------------------------------------------------------------------------------- SuperBill Details Patient Name: Bonnie Kane Date of Service: 08/01/2019 Medical Record Number: [id number] Patient Account Number: [id number] Date of Birth/Sex: 07-27-1945 (73 y.o. F) Treating RN: Army Melia Primary Care Provider: Shawnie Dapper Other Clinician: Referring Provider: Shawnie Dapper Treating Provider/Extender: Melburn Hake, HOYT Weeks in Treatment: 9 Diagnosis Coding ICD-10 Codes Code Description I87.2 Venous insufficiency (chronic) (peripheral) I89.0 Lymphedema, not elsewhere classified I73.89 Other specified peripheral vascular diseases L97.821 Non-pressure chronic ulcer of other part of left lower leg limited to breakdown of skin I50.42 Chronic combined systolic (congestive) and diastolic (congestive) heart failure E11.621 Type 2 diabetes mellitus with foot ulcer Facility Procedures CPT4 Code:  [id number] Description: [id number] - WOUND CARE VISIT-LEV 3 EST PT Modifier: Quantity: 1 Physician Procedures CPT4 Code: [id number] Description: [id number] - WC PHYS LEVEL 3 - EST PT Modifier: Quantity: 1 CPT4 Code: Description: ICD-10 Diagnosis Description I87.2 Venous insufficiency (chronic) (peripheral) I89.0 Lymphedema, not elsewhere classified I73.89 Other specified peripheral vascular diseases L97.821 Non-pressure chronic ulcer of other part of left lower leg  limited to bre Modifier: akdown of skin Quantity: Electronic Signature(s) Signed: 08/06/2019 10:40:04 AM By: Sharon Mt Signed: 08/16/2019 5:05:16 PM By: Worthy Keeler PA-C Previous Signature: 08/01/2019 5:38:33 PM Version By: Worthy Keeler PA-C Entered By: Sharon Mt on 08/06/2019 10:40:03

## 2019-08-08 ENCOUNTER — Ambulatory Visit: Payer: Medicare Other | Admitting: Internal Medicine

## 2019-10-03 ENCOUNTER — Encounter (HOSPITAL_COMMUNITY): Payer: Medicare Other

## 2019-10-03 ENCOUNTER — Ambulatory Visit: Payer: Medicare Other | Admitting: Vascular Surgery

## 2019-10-15 ENCOUNTER — Other Ambulatory Visit: Payer: Self-pay | Admitting: Family Medicine

## 2019-10-15 NOTE — Telephone Encounter (Signed)
Requesting: zolpidem Contract:n/a UDS:n/a Last Visit:07/12/19 Next Visit:03/27/20 Last Refill:03/28/19(90,1)  Please Advise. Medication pending

## 2019-10-31 ENCOUNTER — Other Ambulatory Visit: Payer: Self-pay

## 2019-10-31 ENCOUNTER — Encounter: Payer: Self-pay | Admitting: Family Medicine

## 2019-10-31 ENCOUNTER — Telehealth (INDEPENDENT_AMBULATORY_CARE_PROVIDER_SITE_OTHER): Payer: Medicare Other | Admitting: Family Medicine

## 2019-10-31 VITALS — BP 118/73 | Wt 233.0 lb

## 2019-10-31 DIAGNOSIS — T50905A Adverse effect of unspecified drugs, medicaments and biological substances, initial encounter: Secondary | ICD-10-CM

## 2019-10-31 DIAGNOSIS — R0602 Shortness of breath: Secondary | ICD-10-CM

## 2019-10-31 NOTE — Progress Notes (Signed)
Virtual Visit via Video Note  I connected with pt on 10/31/19 at  3:30 PM EDT by a video enabled telemedicine application and verified that I am speaking with the correct person using two identifiers.  Location patient: home Location provider:work or home office Persons participating in the virtual visit: patient, provider  I discussed the limitations of evaluation and management by telemedicine and the availability of in person appointments. The patient expressed understanding and agreed to proceed.  Telemedicine visit is a necessity given the COVID-19 restrictions in place at the current time.  HPI: 74 y/o WF being seen today for "trouble breathing". When she leans back in chair she starts feeling like she needs to hyperventilate, sits back upright and feels fine.  ONset 2-3 wks ago. No probs lying supine in bed.  No PND.  No CP.  No DOE.  No wheezing. She has noted that she takes cymbalta in the morning and sx's starts about 2 hrs after her dose.  She had taken cymbalta for about 3 mo and started to feel sx's towards the end of the 3 months. No sx's since stopping cymbalta 3 d ago. Has a persistent cough and some HAs but these stopped when she stopped cymbalta. The cymbalta had been helping some for her bilat LL's peripheral neuropathy pain.  She has tried gabapentin in the past for this problem but recalls that it made her have some mild foggy head.  She says she has been trying neosporin on her legs for the pain and it has been helping well and allowing her to get to sleep easier at night, takes 1 tylenol tab every night.  LE swelling better since not sleeping in recliner.  ROS: no fevers, no dizziness, no HAs, no rashes, no melena/hematochezia.  No polyuria or polydipsia.  No myalgias or arthralgias.  No focal weakness, paresthesias, or tremors.  No acute vision or hearing abnormalities.  No nasal congestion, runny nose, or ST. No n/v/d or abd pain.  No palpitations.     Past Medical  History:  Diagnosis Date  . CAD (coronary artery disease) 01/2019   Noted on CT angio chest during the time of her covid illness.  EKG with evidence of inferior and inferolateral MIs.  . Chronic combined systolic and diastolic CHF (congestive heart failure) ([place]) 01/2019  . COVID-19 virus infection 01/2019   Covid test + 01/26/19-->acute hypox RF ([facility]).  . Diabetes mellitus    Type 2  . Edema of both lower extremities due to peripheral venous insufficiency   . Heart murmur, systolic 12/3816   Never could get pt to get ECHO arranged in her hometown [place], [place]  Finally, when she was dx'd with breast cancer 06/2016, she had to get echo and it showed severe AS and she subsequently got TAVR.  Marland Kitchen Hepatic steatosis 2020  . History of left breast cancer 06/17/2016   Invasive ductal carcinoma-->Left mastectomy with axillary lymph node dissection, +radiation,+chemo (all done in [place], [place]  Marland Kitchen HTN (hypertension), benign   . Hx of total knee replacement, bilateral 2018  . Hypertriglyceridemia   . Obesity   . Osteoarthritis    low back, hips, knees  . S/P TAVR (transcatheter aortic valve replacement) 07/2016   [facility]  . Transaminasemia mild 4/11   hep panel- neg  . Venous reflux 06/2019   L saphenous    Past Surgical History:  Procedure Laterality Date  . APPENDECTOMY  1986  . LE arterial dopplers  06/2019  by vascular MD---normal in LE's  . MASTECTOMY WITH AXILLARY LYMPH NODE DISSECTION  06/2016   [place], [place]  . REPLACEMENT TOTAL KNEE BILATERAL  2018/19   [place], [place] ABDOMINAL HYSTERECTOMY  1986  . TRANSCATHETER AORTIC VALVE REPLACEMENT, TRANSFEMORAL  07/2016   [facility].  (Dr. Aline Brochure?)  Pt states she got f/u echo approx 01/2018.  Marland Kitchen TRANSTHORACIC ECHOCARDIOGRAM  01/2019   during covid illness->EF 40-45%, DD, global hypokinesis, prosthetic AV OK.    Family History  Problem Relation Age of Onset  . Other Brother        Dysrythmia  . Other Mother         CHF  . Diabetes Mother        Type 2  . Other Father        CHF  . Celiac disease Sister   . Alcohol abuse Brother   . Multiple sclerosis Brother      Current Outpatient Medications:  .  acetaminophen (TYLENOL) 325 MG tablet, Take by mouth., Disp: , Rfl:  .  ASPIRIN 81 PO, Take 81 mg by mouth., Disp: , Rfl:  .  DULoxetine (CYMBALTA) 30 MG capsule, Take 1 capsule (30 mg total) by mouth daily., Disp: 90 capsule, Rfl: 3 .  furosemide (LASIX) 40 MG tablet, 1 tab po bid, Disp: 180 tablet, Rfl: 1 .  HYDROcodone-acetaminophen (NORCO/VICODIN) 5-325 MG tablet, Take 1-2 tablets by mouth every 6 (six) hours as needed for moderate pain., Disp: 30 tablet, Rfl: 0 .  Misc Natural Products (GLUCOSAMINE CHONDROITIN ADV PO), Take by mouth., Disp: , Rfl:  .  vitamin E 400 UNIT capsule, Take by mouth daily., Disp: , Rfl:  .  ZINC OXIDE PO, Take by mouth., Disp: , Rfl:  .  zolpidem (AMBIEN) 10 MG tablet, TAKE 1 TABLET BY MOUTH ONCE DAILY AT BEDTIME AS NEEDED FOR SLEEP, Disp: 90 tablet, Rfl: 1  EXAM:  VITALS per patient if applicable: BP 161/09 (BP Location: Left Arm, Patient Position: Sitting, Cuff Size: Large)   Wt 233 lb (105.7 kg)   BMI 39.99 kg/m    GENERAL: alert, oriented, appears well and in no acute distress  HEENT: atraumatic, conjunttiva clear, no obvious abnormalities on inspection of external nose and ears  NECK: normal movements of the head and neck  LUNGS: on inspection no signs of respiratory distress, breathing rate appears normal, no obvious gross SOB, gasping or wheezing  CV: no obvious cyanosis  MS: moves all visible extremities without noticeable abnormality  PSYCH/NEURO: pleasant and cooperative, no obvious depression or anxiety, speech and thought processing grossly intact  No results found for: TSH Lab Results  Component Value Date   WBC 7.0 05/23/2019   HGB 12.9 05/23/2019   HCT 40.9 05/23/2019   MCV 86.5 05/23/2019   PLT 178 05/23/2019   Lab Results   Component Value Date   CREATININE 1.12 06/05/2019   BUN 27 (H) 06/05/2019   NA 143 06/05/2019   K 4.6 06/05/2019   CL 105 06/05/2019   CO2 30 06/05/2019   Lab Results  Component Value Date   ALT 21 03/28/2019   AST 25 03/28/2019   ALKPHOS 70 03/28/2019   BILITOT 0.6 03/28/2019   Lab Results  Component Value Date   CHOL 164 09/09/2015   Lab Results  Component Value Date   HDL 23 (A) 01/26/2019   Lab Results  Component Value Date   LDLCALC 46 01/26/2019   Lab Results  Component Value Date  TRIG 92 01/26/2019   Lab Results  Component Value Date   CHOLHDL 5 09/09/2015   Lab Results  Component Value Date   HGBA1C 6.2 03/02/2016    ASSESSMENT AND PLAN:  Discussed the following assessment and plan:  1) Episodic, positional sob. Seemingly NO OTHER SYMPTOM. ?med side effects from cymbalta?--per pt's history it seems to make sense. Stay off cymbalta. Ok to continue neosporin on LLs for neuropathy since it seems to work well for her---although I don't know how/why. Ok to start trial of gabapentin again in 5d at 100mg  tid IF she remains feeling well (w/out sob, ha, or coughing).  Due for bmet and a1c  -we discussed possible serious and likely etiologies, options for evaluation and workup, limitations of telemedicine visit vs in person visit, treatment, treatment risks and precautions. Pt prefers to treat via telemedicine empirically rather then risking or undertaking an in person visit at this moment. Patient agrees to seek prompt in person care if worsening, new symptoms arise, or if is not improving with treatment.   I discussed the assessment and treatment plan with the patient. The patient was provided an opportunity to ask questions and all were answered. The patient agreed with the plan and demonstrated an understanding of the instructions.   The patient was advised to call back or seek an in-person evaluation if the symptoms worsen or if the condition fails to  improve as anticipated.  F/u: call/send my chart note with how she's doing in about 5d. Of course, call/make appt earlier if sx's return/worsen, etc.  Signed:  Crissie Sickles, MD           10/31/2019

## 2019-11-08 ENCOUNTER — Ambulatory Visit: Payer: Medicare Other | Admitting: Family Medicine

## 2019-12-10 ENCOUNTER — Telehealth: Payer: Self-pay

## 2019-12-10 NOTE — Telephone Encounter (Signed)
Patient called to let Dr. Anitra Lauth know she that she has found relief with her feet pain by taking meds (she had with knee surgery) and requests if Dr. Anitra Lauth will prescribe meds for her.  Tylenol with Codeine. She said instructions state take 1 every 6 hours, however 1 pill is lasting her 10 hours.  She was so happy to find relief in something for the pain in her feet.  [place]

## 2019-12-11 MED ORDER — HYDROCODONE-ACETAMINOPHEN 5-325 MG PO TABS: ORAL_TABLET | ORAL | 0 refills | Status: DC

## 2019-12-11 NOTE — Telephone Encounter (Signed)
OK, hydrocodone/apap eRx'd but she must follow up in 3 mo if she has not already set this up.

## 2019-12-11 NOTE — Telephone Encounter (Signed)
The only similar medication on the medication list was Hydrocdone. No prior prescriptions for this medication found on med history. Please advise, thanks.

## 2019-12-11 NOTE — Telephone Encounter (Signed)
Patient made aware refill sent.  

## 2020-01-02 ENCOUNTER — Telehealth: Payer: Self-pay

## 2020-01-02 NOTE — Telephone Encounter (Signed)
Patient husband called ([facility])  Thinks she may have had a heart attack last night.  Sharp chest pain and both arms were hurting. She would not go to ED!  Husband tried to make her go but she kept declining.  I told him that I was sending him to get a nurse to speak with him and he said she is not going to go to the hospital.  I told him again I was going to have a nurse call him back.  He asked to see Dr. Anitra Lauth but I am not not sure if this where she needs to be. Please advise.   Please call husband [phone] [facility].

## 2020-01-02 NOTE — Telephone Encounter (Signed)
Sent as FYI Patient's husband, [facility] advised to take patient to ED for further evaluation. She is not currently having chest pain but they still had concerns of possible heart issues. He said they would take it into consideration.

## 2020-01-02 NOTE — Telephone Encounter (Signed)
This is a problem that needs to be addressed in an emergency setting. I will not see her in the office for this problem.

## 2020-01-03 NOTE — Telephone Encounter (Signed)
Nothing further needed at this time. 

## 2020-01-16 ENCOUNTER — Other Ambulatory Visit: Payer: Self-pay

## 2020-01-16 MED ORDER — HYDROCODONE-ACETAMINOPHEN 5-325 MG PO TABS: ORAL_TABLET | ORAL | 0 refills | Status: DC

## 2020-01-16 NOTE — Telephone Encounter (Signed)
Patient made aware refill sent.  

## 2020-01-16 NOTE — Telephone Encounter (Signed)
Requesting: Norco Contract: n/a UDS: n/a Last Visit:10/31/19 Next Visit:03/27/20 Last Refill:12/11/19 (60,0)  Please Advise. Medication pending

## 2020-01-16 NOTE — Telephone Encounter (Signed)
Patient refill request  HYDROcodone-acetaminophen (NORCO/VICODIN) 5-325 MG tablet [[id number]]   Kane, Bonnie

## 2020-01-24 ENCOUNTER — Telehealth: Payer: Self-pay

## 2020-01-24 NOTE — Telephone Encounter (Signed)
Please schedule virtual appt with PCP for Saturday clinic.

## 2020-01-24 NOTE — Telephone Encounter (Signed)
Patient reports that she has a cold, cough with wheezing and may turn into pneumonia. Husband in hospital with kidney failure.    Do you think we can do a virtual appt tomorrow with her?

## 2020-01-24 NOTE — Telephone Encounter (Signed)
Scheduled appt with PCP virtual visit 10/9

## 2020-01-25 ENCOUNTER — Other Ambulatory Visit: Payer: Self-pay

## 2020-01-25 ENCOUNTER — Encounter: Payer: Self-pay | Admitting: Family Medicine

## 2020-01-25 ENCOUNTER — Telehealth (INDEPENDENT_AMBULATORY_CARE_PROVIDER_SITE_OTHER): Payer: Medicare Other | Admitting: Family Medicine

## 2020-01-25 VITALS — BP 140/71 | HR 79

## 2020-01-25 DIAGNOSIS — Z952 Presence of prosthetic heart valve: Secondary | ICD-10-CM | POA: Diagnosis not present

## 2020-01-25 DIAGNOSIS — J209 Acute bronchitis, unspecified: Secondary | ICD-10-CM | POA: Diagnosis not present

## 2020-01-25 DIAGNOSIS — J069 Acute upper respiratory infection, unspecified: Secondary | ICD-10-CM | POA: Diagnosis not present

## 2020-01-25 DIAGNOSIS — Z8709 Personal history of other diseases of the respiratory system: Secondary | ICD-10-CM | POA: Diagnosis not present

## 2020-01-25 MED ORDER — AZITHROMYCIN 250 MG PO TABS: ORAL_TABLET | ORAL | 0 refills | Status: DC

## 2020-01-25 MED ORDER — PREDNISONE 20 MG PO TABS: ORAL_TABLET | ORAL | 0 refills | Status: DC

## 2020-01-25 NOTE — Progress Notes (Signed)
Virtual Visit via Video Note  I connected with pt on 01/25/20 at 10:00 AM EDT by a video enabled telemedicine application and verified that I am speaking with the correct person using two identifiers.  Location patient: home Location provider:work or home office Persons participating in the virtual visit: patient, provider  I discussed the limitations of evaluation and management by telemedicine and the availability of in person appointments. The patient expressed understanding and agreed to proceed.  Telemedicine visit is a necessity given the COVID-19 restrictions in place at the current time.  HPI: 74 y/o WF being seen for respiratory complaints. About 2 wks ago.  Lots of nasal congestion and mucous, lots of coughing.  Some sound of wheezing/rhonchi described when lying down at night.  Pretty easily winded when walking around.  Occ short winded just sitting but nothing persistent.  No n/v/d or abd pain.  She feels fatigued but no body aches. Coughing was her worst symptom.  Last couple days cough and wheezing mUCH better. No fever.  She is eating and drinking fine. Tried dayquil, nyquil, mucinex severe--helping ok. +sick contacts lately--grandkids.   ROS: See pertinent positives and negatives per HPI.  Past Medical History:  Diagnosis Date  . CAD (coronary artery disease) 01/2019   Noted on CT angio chest during the time of her covid illness.  EKG with evidence of inferior and inferolateral MIs.  . Chronic combined systolic and diastolic CHF (congestive heart failure) ([place]) 01/2019  . COVID-19 virus infection 01/2019   Covid test + 01/26/19-->acute hypox RF ([facility]).  . Diabetes mellitus    Type 2  . Edema of both lower extremities due to peripheral venous insufficiency   . Heart murmur, systolic 85/9292   Never could get pt to get ECHO arranged in her hometown [place], [place]  Finally, when she was dx'd with breast cancer 06/2016, she had to get echo and it  showed severe AS and she subsequently got TAVR.  Marland Kitchen Hepatic steatosis 2020  . History of left breast cancer 06/17/2016   Invasive ductal carcinoma-->Left mastectomy with axillary lymph node dissection, +radiation,+chemo (all done in [place], [place]).  Marland Kitchen HTN (hypertension), benign   . Hx of total knee replacement, bilateral 2018  . Hypertriglyceridemia   . Obesity   . Osteoarthritis    low back, hips, knees  . S/P TAVR (transcatheter aortic valve replacement) 07/2016   [facility]  . Transaminasemia mild 4/11   hep panel- neg  . Venous reflux 06/2019   L saphenous    Past Surgical History:  Procedure Laterality Date  . APPENDECTOMY  1986  . LE arterial dopplers  06/2019   by vascular MD---normal in LE's  . MASTECTOMY WITH AXILLARY LYMPH NODE DISSECTION  06/2016   [place], [place]  . REPLACEMENT TOTAL KNEE BILATERAL  2018/19   [place], [place] ABDOMINAL HYSTERECTOMY  1986  . TRANSCATHETER AORTIC VALVE REPLACEMENT, TRANSFEMORAL  07/2016   [facility].  (Dr. Aline Brochure?)  Pt states she got f/u echo approx 01/2018.  Marland Kitchen TRANSTHORACIC ECHOCARDIOGRAM  01/2019   during covid illness->EF 40-45%, DD, global hypokinesis, prosthetic AV OK.   MEDS: only currently taking her furosemide and ambien.  Current Outpatient Medications:  .  furosemide (LASIX) 40 MG tablet, 1 tab po bid, Disp: 180 tablet, Rfl: 1 .  zolpidem (AMBIEN) 10 MG tablet, TAKE 1 TABLET BY MOUTH ONCE DAILY AT BEDTIME AS NEEDED FOR SLEEP, Disp: 90 tablet, Rfl: 1 .  acetaminophen (TYLENOL) 325 MG tablet, Take  by mouth. (Patient not taking: Reported on 01/25/2020), Disp: , Rfl:  .  ASPIRIN 81 PO, Take 81 mg by mouth. (Patient not taking: Reported on 01/25/2020), Disp: , Rfl:  .  DULoxetine (CYMBALTA) 30 MG capsule, Take 1 capsule (30 mg total) by mouth daily. (Patient not taking: Reported on 01/25/2020), Disp: 90 capsule, Rfl: 3 .  HYDROcodone-acetaminophen (NORCO/VICODIN) 5-325 MG tablet, 1-2 tabs po bid as needed for feet pain (Patient not taking:  Reported on 01/25/2020), Disp: 60 tablet, Rfl: 0 .  Misc Natural Products (GLUCOSAMINE CHONDROITIN ADV PO), Take by mouth. (Patient not taking: Reported on 01/25/2020), Disp: , Rfl:  .  vitamin E 400 UNIT capsule, Take by mouth daily. (Patient not taking: Reported on 01/25/2020), Disp: , Rfl:  .  ZINC OXIDE PO, Take by mouth. (Patient not taking: Reported on 01/25/2020), Disp: , Rfl:   EXAM:  VITALS per patient if applicable:  Vitals with BMI 01/25/2020 10/31/2019 07/12/2019  Height - - -  Weight - 233 lbs -  BMI - - -  Systolic [phone]  Diastolic 71 73 72  Pulse 79 - -  O2 sat on RA today is 95%  GENERAL: alert, oriented, appears well and in no acute distress Able to talk in full sentences.  Lucid, pleasant.  HEENT: atraumatic, conjunttiva clear, no obvious abnormalities on inspection of external nose and ears  NECK: normal movements of the head and neck  LUNGS: on inspection no signs of respiratory distress, breathing rate appears normal, no obvious gross SOB, gasping or wheezing  CV: no obvious cyanosis  MS: moves all visible extremities without noticeable abnormality  PSYCH/NEURO: pleasant and cooperative, no obvious depression or anxiety, speech and thought processing grossly intact  LABS: none today    Chemistry      Component Value Date/Time   NA 143 06/05/2019 1043   NA 137 01/31/2019 0000   K 4.6 06/05/2019 1043   CL 105 06/05/2019 1043   CO2 30 06/05/2019 1043   BUN 27 (H) 06/05/2019 1043   BUN 25 (A) 01/31/2019 0000   CREATININE 1.12 06/05/2019 1043   CREATININE 1.13 (H) 05/23/2019 1409   GLU 166 01/31/2019 0000      Component Value Date/Time   CALCIUM 9.0 06/05/2019 1043   ALKPHOS 70 03/28/2019 1553   AST 25 03/28/2019 1553   ALT 21 03/28/2019 1553   BILITOT 0.6 03/28/2019 1553     Lab Results  Component Value Date   HGBA1C 6.2 03/02/2016    ASSESSMENT AND PLAN:  Discussed the following assessment and plan:  Acute URI with bronchitis,  improving last 1-2 d but has lasted about 2 wks. Hx of bronchitis/LRTI, hx of TAVR, +DM, hx of acute hypoxic RF due to covid 04 Feb 2019--->She is high risk for serious LRTI (although it is a good sign that she is improved signif in last 36h). Will treat aggressively at this time: prednisone 40mg  qd x 5d. Z-pack. She has had covid illness---acute hypoxic RF, hospitalized 01/2019. Obviously this still could be another covid infection but no sign of RF or sepsis or dehydration. Quarantine instructions reviewed with pt. Signs/symptoms to call or return for were reviewed and pt expressed understanding.  -we discussed possible serious and likely etiologies, options for evaluation and workup, limitations of telemedicine visit vs in person visit, treatment, treatment risks and precautions. Pt prefers to treat via telemedicine empirically rather than in person at this moment.    I discussed the assessment and treatment plan  with the patient. The patient was provided an opportunity to ask questions and all were answered. The patient agreed with the plan and demonstrated an understanding of the instructions.    F/u: if not continuing to improve signif over the next 3-4d  Signed:  Crissie Sickles, MD           01/25/2020

## 2020-01-28 ENCOUNTER — Other Ambulatory Visit: Payer: Self-pay

## 2020-01-28 DIAGNOSIS — R059 Cough, unspecified: Secondary | ICD-10-CM

## 2020-01-28 DIAGNOSIS — Z952 Presence of prosthetic heart valve: Secondary | ICD-10-CM

## 2020-01-28 DIAGNOSIS — Z8679 Personal history of other diseases of the circulatory system: Secondary | ICD-10-CM

## 2020-01-28 NOTE — Telephone Encounter (Signed)
Spoke with patient and she is still finishing first round of antibiotics sent in. She still has today and tomorrow until complete. Last seen on 10/9 for virtual appt. Please advise if f/u appt needed, thanks.

## 2020-01-28 NOTE — Telephone Encounter (Signed)
I would not expect her to feel 100% yet! "Lots better" is super.  Give it another 7-10 days, should continue to see gradual resolution of symptoms and return to normal over that time period. No additional med or visit is needed at this time.

## 2020-01-28 NOTE — Telephone Encounter (Signed)
Pt aware and verbalized understanding.  

## 2020-01-28 NOTE — Telephone Encounter (Signed)
Patient stated she feels like she may need another round of antibiotics / prednisone.  She states she feels a lot better but not quite 100%.  azithromycin (ZITHROMAX) 250 MG tablet [[id number]]   predniSONE (DELTASONE) 20 MG tablet [[id number]]   [place], [place]

## 2020-01-29 MED ORDER — DOXYCYCLINE HYCLATE 100 MG PO CAPS
100.0000 mg | ORAL_CAPSULE | Freq: Two times a day (BID) | ORAL | 0 refills | Status: AC
Start: 2020-01-29 — End: 2020-02-05

## 2020-01-29 MED ORDER — PREDNISONE 20 MG PO TABS
ORAL_TABLET | ORAL | 0 refills | Status: DC
Start: 2020-01-29 — End: 2020-03-27

## 2020-01-29 NOTE — Telephone Encounter (Signed)
Calling again today bc she is concerned about regression. Not feeling any improvement over 48 hours. Still has congestion and coughing still. Wants to make sure that Dr Anitra Lauth thinks she should wait 7-10 she will. States she is scared to start feeling that bad again.

## 2020-01-29 NOTE — Telephone Encounter (Signed)
Spoke with pt and inform her meds was sent. Pharm verified. Pt agrees to CXR

## 2020-01-29 NOTE — Telephone Encounter (Signed)
I pended rx's for doxycycline and prednisone b/c I want to clarify which pharmacy she wants them sent to. Also, I want her to go to get a chest x-ray at [facility] as soon as she can, no appt needed.  I have ordered it.

## 2020-01-30 ENCOUNTER — Other Ambulatory Visit: Payer: Self-pay

## 2020-01-30 ENCOUNTER — Ambulatory Visit (HOSPITAL_BASED_OUTPATIENT_CLINIC_OR_DEPARTMENT_OTHER)
Admission: RE | Admit: 2020-01-30 | Discharge: 2020-01-30 | Disposition: A | Discharge status: Home / Self Care | Payer: Medicare Other | Source: Ambulatory Visit | Attending: Family Medicine | Admitting: Family Medicine

## 2020-01-30 DIAGNOSIS — Z8679 Personal history of other diseases of the circulatory system: Secondary | ICD-10-CM | POA: Diagnosis not present

## 2020-01-30 DIAGNOSIS — R059 Cough, unspecified: Secondary | ICD-10-CM | POA: Insufficient documentation

## 2020-01-30 DIAGNOSIS — R062 Wheezing: Secondary | ICD-10-CM | POA: Diagnosis not present

## 2020-01-30 DIAGNOSIS — Z952 Presence of prosthetic heart valve: Secondary | ICD-10-CM | POA: Diagnosis not present

## 2020-01-31 ENCOUNTER — Telehealth: Payer: Self-pay

## 2020-01-31 NOTE — Telephone Encounter (Signed)
Patient advised results have not been reviewed yet but will be notified once available.

## 2020-01-31 NOTE — Telephone Encounter (Signed)
Calling regarding xray results.  I told her I wasn't sure if Dr. Anitra Lauth has read results yet  [phone]

## 2020-03-27 ENCOUNTER — Ambulatory Visit (INDEPENDENT_AMBULATORY_CARE_PROVIDER_SITE_OTHER): Payer: Medicare Other | Admitting: Family Medicine

## 2020-03-27 ENCOUNTER — Encounter: Payer: Self-pay | Admitting: Family Medicine

## 2020-03-27 ENCOUNTER — Other Ambulatory Visit: Payer: Self-pay

## 2020-03-27 VITALS — BP 128/74 | HR 79 | Temp 97.5°F | Resp 16 | Ht 64.0 in | Wt 219.6 lb

## 2020-03-27 DIAGNOSIS — I890 Lymphedema, not elsewhere classified: Secondary | ICD-10-CM | POA: Diagnosis not present

## 2020-03-27 DIAGNOSIS — N183 Chronic kidney disease, stage 3 unspecified: Secondary | ICD-10-CM | POA: Diagnosis not present

## 2020-03-27 DIAGNOSIS — E781 Pure hyperglyceridemia: Secondary | ICD-10-CM | POA: Diagnosis not present

## 2020-03-27 DIAGNOSIS — E1142 Type 2 diabetes mellitus with diabetic polyneuropathy: Secondary | ICD-10-CM | POA: Diagnosis not present

## 2020-03-27 DIAGNOSIS — L84 Corns and callosities: Secondary | ICD-10-CM

## 2020-03-27 DIAGNOSIS — F5101 Primary insomnia: Secondary | ICD-10-CM | POA: Diagnosis not present

## 2020-03-27 DIAGNOSIS — I5042 Chronic combined systolic (congestive) and diastolic (congestive) heart failure: Secondary | ICD-10-CM | POA: Diagnosis not present

## 2020-03-27 DIAGNOSIS — M792 Neuralgia and neuritis, unspecified: Secondary | ICD-10-CM

## 2020-03-27 DIAGNOSIS — E1149 Type 2 diabetes mellitus with other diabetic neurological complication: Secondary | ICD-10-CM | POA: Diagnosis not present

## 2020-03-27 LAB — CBC WITH DIFFERENTIAL/PLATELET
Basophils Absolute: 0 10*3/uL (ref 0.0–0.1)
Basophils Relative: 0.4 % (ref 0.0–3.0)
Eosinophils Absolute: 0.2 10*3/uL (ref 0.0–0.7)
Eosinophils Relative: 2.2 % (ref 0.0–5.0)
HCT: 45.5 % (ref 36.0–46.0)
Hemoglobin: 14.9 g/dL (ref 12.0–15.0)
Lymphocytes Relative: 11.4 % — ABNORMAL LOW (ref 12.0–46.0)
Lymphs Abs: 0.8 10*3/uL (ref 0.7–4.0)
MCHC: 32.7 g/dL (ref 30.0–36.0)
MCV: 88.2 fl (ref 78.0–100.0)
Monocytes Absolute: 0.6 10*3/uL (ref 0.1–1.0)
Monocytes Relative: 8.7 % (ref 3.0–12.0)
Neutro Abs: 5.7 10*3/uL (ref 1.4–7.7)
Neutrophils Relative %: 77.3 % — ABNORMAL HIGH (ref 43.0–77.0)
Platelets: 170 10*3/uL (ref 150.0–400.0)
RBC: 5.16 Mil/uL — ABNORMAL HIGH (ref 3.87–5.11)
RDW: 19 % — ABNORMAL HIGH (ref 11.5–15.5)
WBC: 7.3 10*3/uL (ref 4.0–10.5)

## 2020-03-27 LAB — HEMOGLOBIN A1C: Hgb A1c MFr Bld: 7.5 % — ABNORMAL HIGH (ref 4.6–6.5)

## 2020-03-27 LAB — COMPREHENSIVE METABOLIC PANEL
ALT: 13 U/L (ref 0–35)
AST: 18 U/L (ref 0–37)
Albumin: 3.9 g/dL (ref 3.5–5.2)
Alkaline Phosphatase: 74 U/L (ref 39–117)
BUN: 26 mg/dL — ABNORMAL HIGH (ref 6–23)
CO2: 30 mEq/L (ref 19–32)
Calcium: 9.8 mg/dL (ref 8.4–10.5)
Chloride: 98 mEq/L (ref 96–112)
Creatinine, Ser: 0.96 mg/dL (ref 0.40–1.20)
GFR: 58.06 mL/min — ABNORMAL LOW (ref >60.00)
Glucose, Bld: 138 mg/dL — ABNORMAL HIGH (ref 70–99)
Potassium: 4.4 mEq/L (ref 3.5–5.1)
Sodium: 137 mEq/L (ref 135–145)
Total Bilirubin: 0.9 mg/dL (ref 0.2–1.2)
Total Protein: 7.3 g/dL (ref 6.0–8.3)

## 2020-03-27 LAB — LIPID PANEL
Cholesterol: 121 mg/dL (ref 0–200)
HDL: 38 mg/dL — ABNORMAL LOW (ref >39.00)
LDL Cholesterol: 63 mg/dL (ref 0–99)
NonHDL: 83.22
Total CHOL/HDL Ratio: 3
Triglycerides: 101 mg/dL (ref 0.0–149.0)
VLDL: 20.2 mg/dL (ref 0.0–40.0)

## 2020-03-27 LAB — TSH: TSH: 4.2 u[IU]/mL (ref 0.35–4.50)

## 2020-03-27 LAB — MICROALBUMIN / CREATININE URINE RATIO
Creatinine,U: 83.4 mg/dL
Microalb Creat Ratio: 5.8 mg/g (ref 0.0–30.0)
Microalb, Ur: 4.8 mg/dL — ABNORMAL HIGH (ref 0.0–1.9)

## 2020-03-27 NOTE — Progress Notes (Signed)
OFFICE VISIT  03/27/2020  CC:  Chief Complaint  Patient presents with  . Follow-up    RCI, pt is fasting   HPI:    Patient is a 74 y.o. Caucasian female who presents for f/u HTN, chronic combined syst/diast HF, hx of TAVR, DM 2, CRI III, bilat LL venous insufficiency edema/lymphedema, morbid obesity, hypertriglyceridemia, and insomnia.  Doing well. Feet always "sore" and numb.  Occ uses vicodin.  Not on cymbalta anymore. Taking lasix daily and wearing compression stockings daily. Walks lots of circles in her home for exercise, doing optivia wt loss diet. Has lost 45 lbs in the last 9 months! No SOB or CP or dizziness or palpitations. Typical fasting gluc 130s lately. BP occ checked at home: always 120s/70s.  HR 70 avg.   PMP AWARE reviewed today: most recent rx for Lorrin Mais was filled 01/18/20, # 22, rx by me. Vicodin rx filled 01/16/20, #60, rx by me. No red flags.   Past Medical History:  Diagnosis Date  . CAD (coronary artery disease) 01/2019   Noted on CT angio chest during the time of her covid illness.  EKG with evidence of inferior and inferolateral MIs.  . Chronic combined systolic and diastolic CHF (congestive heart failure) ([place]) 01/2019  . COVID-19 virus infection 01/2019   Covid test + 01/26/19-->acute hypox RF ([facility]).  . Diabetes mellitus    Type 2  . Edema of both lower extremities due to peripheral venous insufficiency   . Heart murmur, systolic 21/1941   Never could get pt to get ECHO arranged in her hometown [place], [place]  Finally, when she was dx'd with breast cancer 06/2016, she had to get echo and it showed severe AS and she subsequently got TAVR.  Marland Kitchen Hepatic steatosis 2020  . History of left breast cancer 06/17/2016   Invasive ductal carcinoma-->Left mastectomy with axillary lymph node dissection, +radiation,+chemo (all done in [place], [place]).  Marland Kitchen HTN (hypertension), benign   . Hx of total knee replacement, bilateral 2018  .  Hypertriglyceridemia   . Obesity   . Osteoarthritis    low back, hips, knees  . S/P TAVR (transcatheter aortic valve replacement) 07/2016   [facility]  . Transaminasemia mild 4/11   hep panel- neg  . Venous reflux 06/2019   L saphenous    Past Surgical History:  Procedure Laterality Date  . APPENDECTOMY  1986  . LE arterial dopplers  06/2019   by vascular MD---normal in LE's  . MASTECTOMY WITH AXILLARY LYMPH NODE DISSECTION  06/2016   [place], [place]  . REPLACEMENT TOTAL KNEE BILATERAL  2018/19   [place], [place] ABDOMINAL HYSTERECTOMY  1986  . TRANSCATHETER AORTIC VALVE REPLACEMENT, TRANSFEMORAL  07/2016   [facility].  (Dr. Aline Brochure?)  Pt states she got f/u echo approx 01/2018.  Marland Kitchen TRANSTHORACIC ECHOCARDIOGRAM  01/2019   during covid illness->EF 40-45%, DD, global hypokinesis, prosthetic AV OK.    Outpatient Medications Prior to Visit  Medication Sig Dispense Refill  . ASPIRIN 81 PO Take 81 mg by mouth.    . furosemide (LASIX) 40 MG tablet 1 tab po bid 180 tablet 1  . zolpidem (AMBIEN) 10 MG tablet TAKE 1 TABLET BY MOUTH ONCE DAILY AT BEDTIME AS NEEDED FOR SLEEP 90 tablet 1  . acetaminophen (TYLENOL) 325 MG tablet Take by mouth. (Patient not taking: No sig reported)    . HYDROcodone-acetaminophen (NORCO/VICODIN) 5-325 MG tablet 1-2 tabs po bid as needed for feet pain (Patient not taking: No  sig reported) 60 tablet 0  . azithromycin (ZITHROMAX) 250 MG tablet 2 tabs po qd x 1d, then 1 tab po qd x 4d (Patient not taking: Reported on 03/27/2020) 6 tablet 0  . DULoxetine (CYMBALTA) 30 MG capsule Take 1 capsule (30 mg total) by mouth daily. (Patient not taking: No sig reported) 90 capsule 3  . Misc Natural Products (GLUCOSAMINE CHONDROITIN ADV PO) Take by mouth. (Patient not taking: No sig reported)    . predniSONE (DELTASONE) 20 MG tablet 2 tabs po qd x 3 days then 1 tab po qd x 3 days (Patient not taking: Reported on 03/27/2020) 9 tablet 0  . vitamin E 400 UNIT capsule Take by mouth daily.  (Patient not taking: No sig reported)    . ZINC OXIDE PO Take by mouth. (Patient not taking: No sig reported)     No facility-administered medications prior to visit.    Allergies  Allergen Reactions  . Penicillins     REACTION: neck/throat swelling  . Gabapentin Other (See Comments)    emotional    ROS As per HPI  PE: Vitals with BMI 03/27/2020 01/25/2020 10/31/2019  Height 5\' 4"  - -  Weight 219 lbs 10 oz - 233 lbs  BMI 83.33 - -  Systolic 832 919 166  Diastolic 74 71 73  Pulse 79 79 -     Gen: Alert, well appearing.  Patient is oriented to person, place, time, and situation. AFFECT: pleasant, lucid thought and speech. CV: RRR, no m/r/g.   LUNGS: CTA bilat, nonlabored resps, good aeration in all lung fields. EXT: no clubbing or cyanosis.  1+ pitting edema palpable through her compression stockings, +tender to push much on this. Foot exam - diffuse mild swelling and violaceous hue/rubor.  No tenderness. Normal temp. Sensation diminished bilat entire foot/LL's.   Peripheral pulses are trace palpable. Toenails are mycotic. R foot w/out ulcer or callus. L foot w/preulcerative callus with thin scab over tip of great toe and tip of 3rd toe.    LABS:  No results found for: TSH Lab Results  Component Value Date   WBC 7.0 05/23/2019   HGB 12.9 05/23/2019   HCT 40.9 05/23/2019   MCV 86.5 05/23/2019   PLT 178 05/23/2019   Lab Results  Component Value Date   CREATININE 1.12 06/05/2019   BUN 27 (H) 06/05/2019   NA 143 06/05/2019   K 4.6 06/05/2019   CL 105 06/05/2019   CO2 30 06/05/2019   Lab Results  Component Value Date   ALT 21 03/28/2019   AST 25 03/28/2019   ALKPHOS 70 03/28/2019   BILITOT 0.6 03/28/2019   Lab Results  Component Value Date   CHOL 164 09/09/2015   Lab Results  Component Value Date   HDL 23 (A) 01/26/2019   Lab Results  Component Value Date   LDLCALC 46 01/26/2019   Lab Results  Component Value Date   TRIG 92 01/26/2019   Lab  Results  Component Value Date   CHOLHDL 5 09/09/2015   Lab Results  Component Value Date   HGBA1C 6.2 03/02/2016   IMPRESSION AND PLAN:  1) DM 2, glucoses mildly elev when checks AM, no checks later in day. +DPN, L foot with preulcerative calluses; pt would benefit from diabetic shoes, rx given today. Check a1c, lytes/cr, urine microalb/cr, lipids.  2) HTN: bp's actually consistently good on no antihypertensives (just some lasix). Lytes/cr today.    3) Hypertrig: great diet and improved activity, excellent wt  loss. FLP and hepatic panel today.  4) Insomnia: stable on ambien consistently. CSC today. No new rx needed today.  5) Neuropathic feet/LL's pain: DPN. No meds except occ prn use of vicodin, uses very responsibly. CSC today, no new rx needed.  6) Lymphedema: well controlled with consistent use of compression stockings, sleeps in bed and not recliner now, and uses lasix 40mg  bid.  7) s/p AVR--doing well.  8) Hx of combined syst/diast HF: asymptomatic. Lasix 40mg  bid. Lytes/cr today.  9) CRI III: avoids NSAIDs.  Hydrates well. Lytes/cr today.  Prevnar 13->given today. Flu->declined. Covid->declined.  An After Visit Summary was printed and given to the patient.  FOLLOW UP: Return in about 3 months (around 06/25/2020) for routine chronic illness f/u.   Signed:  Crissie Sickles, MD           03/27/2020

## 2020-04-07 ENCOUNTER — Telehealth: Payer: Self-pay

## 2020-04-07 ENCOUNTER — Other Ambulatory Visit: Payer: Self-pay

## 2020-04-07 NOTE — Telephone Encounter (Signed)
Pt scheduled to see PCP tomorrow, in office for further evaluation.  [facility] - Client TELEPHONE ADVICE RECORD [facility] Patient Name: Bonnie Kane Gender: Female DOB: [birth year] Age: 74 Y 24 M 64 D Return Phone Number: [phone] (Primary) Address: City/State/Zip: [place] [address] Client [facility] - Client Client Site [facility] - Day Physician Crissie Sickles - MD Contact Type Call Who Is Calling Patient / Member / Family / Caregiver Call Type Triage / Clinical Relationship To Patient Self Return Phone Number [phone] (Primary) Chief Complaint Dizziness Reason for Call Symptomatic / Request for [place] states she had spoke with a nurse and she was told to schedule an appointment. Office staff needs a triage outcome, so she needs to be triaged again. Patient has been experiencing breast pain and redness. Pt is current breast feeding. Translation No Nurse Assessment Nurse: Thad Ranger, RN, Denise Date/Time (Eastern Time): 04/07/2020 11:08:12 AM Confirm and document reason for call. If symptomatic, describe symptoms. ---Pt w/dizziness Does the patient have any new or worsening symptoms? ---Yes Will a triage be completed? ---Yes Related visit to physician within the last 2 weeks? ---No Does the PT have any chronic conditions? (i.e. diabetes, asthma, this includes High risk factors for pregnancy, etc.) ---No Is this a behavioral health or substance abuse call? ---No Guidelines Guideline Title Affirmed Question Affirmed Notes Nurse Date/Time (Eastern Time) Dizziness - Lightheadedness [1] MODERATE dizziness (e.g., interferes with normal activities) AND [2] has NOT been evaluated by physician for this (Exception: dizziness caused by heat exposure, sudden standing, or poor fluid intake) Carmon, RN, Denise 04/07/2020 11:08:36 AM Disp. Time [name] Time) Disposition  Final User 04/07/2020 11:12:10 AM See PCP within 24 Hours Yes Carmon, RN, Langley Gauss PLEASE NOTE: All timestamps contained within this report are represented as [place] Standard Time. CONFIDENTIALTY NOTICE: This fax transmission is intended only for the addressee. It contains information that is legally privileged, confidential or otherwise protected from use or disclosure. If you are not the intended recipient, you are strictly prohibited from reviewing, disclosing, copying using or disseminating any of this information or taking any action in reliance on or regarding this information. If you have received this fax in error, please notify us immediately by telephone so that we can arrange for its return to [place]. Phone: [phone], Toll-Free: [phone], Fax: [phone] Page: 2 of 2 Call Id: [id number] Newman Disagree/Comply Comply Caller Understands Yes PreDisposition Call Doctor Care Advice Given Per Guideline SEE PCP WITHIN 24 HOURS: DRINK FLUIDS: * Drink several glasses of fruit juice, other clear fluids or water. LIE DOWN AND REST: * Lie down with feet elevated for 1 hour. * This will improve circulation and increase blood flow to the brain. CALL BACK IF: * Passes out (faints) * You become worse CARE ADVICE given per Dizziness (Adult) guideline. Referrals REFERRED TO PCP OFFICE

## 2020-04-07 NOTE — Telephone Encounter (Signed)
Pt states she is experiencing dizzy spells. She said yesterday she about fell. I sent patient to access nurse to be triaged.

## 2020-04-08 ENCOUNTER — Ambulatory Visit (INDEPENDENT_AMBULATORY_CARE_PROVIDER_SITE_OTHER): Payer: Medicare Other | Admitting: Family Medicine

## 2020-04-08 ENCOUNTER — Encounter: Payer: Self-pay | Admitting: Family Medicine

## 2020-04-08 VITALS — BP 135/78 | HR 84 | Temp 97.4°F | Resp 16 | Ht 64.0 in | Wt 216.6 lb

## 2020-04-08 DIAGNOSIS — I5042 Chronic combined systolic (congestive) and diastolic (congestive) heart failure: Secondary | ICD-10-CM

## 2020-04-08 DIAGNOSIS — R42 Dizziness and giddiness: Secondary | ICD-10-CM

## 2020-04-08 MED ORDER — FUROSEMIDE 40 MG PO TABS: ORAL_TABLET | ORAL | 3 refills | Status: DC

## 2020-04-08 NOTE — Progress Notes (Signed)
OFFICE VISIT  04/08/2020  CC:  Chief Complaint  Patient presents with   Dizzy spells    Looking down causes dizziness and nausea, noticed within the last week and a half   HPI:    Patient is a 74 y.o. Caucasian female who presents for "dizzy spells". Describes two types of "dizzy": 1) spinning sensation + nausea that is triggered by gazing upward but NOT tipping head back--just the eye gaze upward.  Present on and off for years but seems more common last couple weeks.  Some days nothing, though.  Episodes last a few seconds.   Has chronic bilat hearing loss and bilat tinnitus. No hx of headaches or head trauma.  No double vision or blurry vision. 2) Lightheaded upon standing and taking some steps, then resolves. NOT spinning at these times.  No presyncope or syncope. No palpitations.  No CP, no SOB or DOE.  No orthopnea.  She takes lasix 40mg  once a day.  Drinks 64 oz water daily as part of her wt loss diet. No hypoglycemia and no low or high bp's at home.   ROS: no fevers, no CP, no SOB, no wheezing, no cough, no HAs, no rashes, no melena/hematochezia.  No polyuria or polydipsia.  No myalgias or arthralgias.  No focal weakness, paresthesias, or tremors.  No acute vision or hearing abnormalities. No n/v/d or abd pain.  No palpitations.    Past Medical History:  Diagnosis Date   CAD (coronary artery disease) 01/2019   Noted on CT angio chest during the time of her covid illness.  EKG with evidence of inferior and inferolateral MIs.   Chronic combined systolic and diastolic CHF (congestive heart failure) ([place]) 01/2019   COVID-19 virus infection 01/2019   Covid test + 01/26/19-->acute hypox RF ([facility]).   Diabetes mellitus    Type 2   Edema of both lower extremities due to peripheral venous insufficiency    Heart murmur, systolic [id number]   Never could get pt to get ECHO arranged in her hometown [place], [place]  Finally, when she was dx'd with breast cancer  06/2016, she had to get echo and it showed severe AS and she subsequently got TAVR.   Hepatic steatosis 2020   History of left breast cancer 06/17/2016   Invasive ductal carcinoma-->Left mastectomy with axillary lymph node dissection, +radiation,+chemo (all done in [place], [place]   HTN (hypertension), benign    Hx of total knee replacement, bilateral 2018   Hypertriglyceridemia    Obesity    Osteoarthritis    low back, hips, knees   S/P TAVR (transcatheter aortic valve replacement) 07/2016   [facility]   Transaminasemia mild 4/11   hep panel- neg   Venous reflux 06/2019   L saphenous    Past Surgical History:  Procedure Laterality Date   APPENDECTOMY  1986   LE arterial dopplers  06/2019   by vascular MD---normal in [place]  06/2016   [place], [place]   REPLACEMENT TOTAL KNEE BILATERAL  2018/19   [place], [place]   TOTAL ABDOMINAL HYSTERECTOMY  1986   TRANSCATHETER AORTIC VALVE REPLACEMENT, TRANSFEMORAL  07/2016   [facility].  (Dr. Aline Brochure?)  Pt states she got f/u echo approx 01/2018.   TRANSTHORACIC ECHOCARDIOGRAM  01/2019   during covid illness->EF 40-45%, DD, global hypokinesis, prosthetic AV OK.    Outpatient Medications Prior to Visit  Medication Sig Dispense Refill   acetaminophen (TYLENOL) 325 MG tablet Take by mouth.  ASPIRIN 81 PO Take 81 mg by mouth.     HYDROcodone-acetaminophen (NORCO/VICODIN) 5-325 MG tablet 1-2 tabs po bid as needed for feet pain 60 tablet 0   zolpidem (AMBIEN) 10 MG tablet TAKE 1 TABLET BY MOUTH ONCE DAILY AT BEDTIME AS NEEDED FOR SLEEP 90 tablet 1   furosemide (LASIX) 40 MG tablet 1 tab po bid 180 tablet 1   No facility-administered medications prior to visit.    Allergies  Allergen Reactions   Penicillins     REACTION: neck/throat swelling   Gabapentin Other (See Comments)    emotional    ROS As per HPI  PE: Vitals with BMI 04/08/2020 03/27/2020 01/25/2020  Height 5\' 4"  5'  4" -  Weight 216 lbs 10 oz 219 lbs 10 oz -  BMI 09.81 19.14 -  Systolic 782 956 213  Diastolic 78 74 71  Pulse 84 79 79   Exam chaperoned by Deveron Furlong, CMA.  Gen: Alert, well appearing.  Patient is oriented to person, place, time, and situation. AFFECT: pleasant, lucid thought and speech. ENT: Ears: EACs clear, normal epithelium.  TMs with good light reflex and landmarks bilaterally.  Eyes: no injection, icteris, swelling, or exudate.  EOMI, PERRLA.  NO nystagmus. Nose: no drainage or turbinate edema/swelling.  No injection or focal lesion.  Mouth: lips without lesion/swelling.  Oral mucosa pink and moist.  Dentition intact and without obvious caries or gingival swelling.  Oropharynx without erythema, exudate, or swelling.  CV: RRR, no m/r/g.   LUNGS: CTA bilat, nonlabored resps, good aeration in all lung fields. DIX HALPIKE: unable to do this b/c of pt's chronic neck and back stiffness EXT: no clubbing or cyanosis.  1+ bilat LL pitting edema but mild nonpitting edema palpable beneath compression stockings.  Neuro: CN 2-12 intact bilaterally, strength 5/5 in proximal and distal upper extremities and lower extremities bilaterally.  No tremor.  No ataxia.    LABS:    Chemistry      Component Value Date/Time   NA 137 03/27/2020 0910   NA 137 01/31/2019 0000   K 4.4 03/27/2020 0910   CL 98 03/27/2020 0910   CO2 30 03/27/2020 0910   BUN 26 (H) 03/27/2020 0910   BUN 25 (A) 01/31/2019 0000   CREATININE 0.96 03/27/2020 0910   CREATININE 1.13 (H) 05/23/2019 1409   GLU 166 01/31/2019 0000      Component Value Date/Time   CALCIUM 9.8 03/27/2020 0910   ALKPHOS 74 03/27/2020 0910   AST 18 03/27/2020 0910   ALT 13 03/27/2020 0910   BILITOT 0.9 03/27/2020 0910     Lab Results  Component Value Date   WBC 7.3 03/27/2020   HGB 14.9 03/27/2020   HCT 45.5 03/27/2020   MCV 88.2 03/27/2020   PLT 170.0 03/27/2020   Lab Results  Component Value Date   TSH 4.20 03/27/2020   Lab  Results  Component Value Date   HGBA1C 7.5 (H) 03/27/2020   IMPRESSION AND PLAN:  1) Vertigo: upward gaze is her trigger but it is not consistent. No red flags for ominous intracranial process. No w/u or treatment at this time. Signs/symptoms to call or return for were reviewed and pt expressed understanding. Neuro referral if sx's worsening.  2) Orthostatic dizziness, chronic and mild. She has adjusted behavior to accommodate. No acute changes. No sign of hypovolemia.  3) Chronic combined syst and diast HF: as per echo 01/2019 when she was hospitalized for severe covid illness.  She is  asymptomatic in her day to day functioning.  Takes 40mg  lasix qd. She is in favor of minimal meds and non-aggressive in her approach to testing and I'm okay with this at this time.  4) AV stenosis, hx of TAVR: doing well.  I don't think any of her vertigo or dizziness is related to valve dysfunction or restenosis of AV.  An After Visit Summary was printed and given to the patient.  FOLLOW UP: Return if symptoms worsen or fail to improve.  Signed:  Crissie Sickles, MD           04/08/2020

## 2020-04-09 ENCOUNTER — Ambulatory Visit: Payer: Medicare Other | Admitting: Orthotics

## 2020-04-09 ENCOUNTER — Ambulatory Visit: Payer: Medicare Other | Admitting: Podiatry

## 2020-04-22 ENCOUNTER — Other Ambulatory Visit: Payer: Self-pay | Admitting: Family Medicine

## 2020-04-22 NOTE — Telephone Encounter (Signed)
Requesting: zolpidem Contract: 03/27/20 UDS: n/a Last Visit:04/08/20 Next Visit:06/25/20 Last Refill: 10/15/19 (90,1)  Please Advise

## 2020-06-25 ENCOUNTER — Telehealth (INDEPENDENT_AMBULATORY_CARE_PROVIDER_SITE_OTHER): Payer: Medicare Other | Admitting: Family Medicine

## 2020-06-25 ENCOUNTER — Encounter: Payer: Self-pay | Admitting: Family Medicine

## 2020-06-25 VITALS — BP 131/72 | Wt 211.0 lb

## 2020-06-25 DIAGNOSIS — E1149 Type 2 diabetes mellitus with other diabetic neurological complication: Secondary | ICD-10-CM | POA: Diagnosis not present

## 2020-06-25 DIAGNOSIS — R600 Localized edema: Secondary | ICD-10-CM | POA: Diagnosis not present

## 2020-06-25 DIAGNOSIS — I5042 Chronic combined systolic (congestive) and diastolic (congestive) heart failure: Secondary | ICD-10-CM | POA: Diagnosis not present

## 2020-06-25 MED ORDER — AZITHROMYCIN 250 MG PO TABS: ORAL_TABLET | ORAL | 0 refills | Status: DC

## 2020-06-25 MED ORDER — HYDROCODONE-HOMATROPINE 5-1.5 MG/5ML PO SYRP: ORAL_SOLUTION | ORAL | 0 refills | Status: DC

## 2020-06-25 MED ORDER — PREDNISONE 20 MG PO TABS: ORAL_TABLET | ORAL | 0 refills | Status: DC

## 2020-06-25 NOTE — Progress Notes (Signed)
Virtual Visit via Video Note  I connected with pt on 06/25/20 at 10:00 AM EST by a video enabled telemedicine application and verified that I am speaking with the correct person using two identifiers.  Location patient: home, [place] Location provider:work or home office Persons participating in the virtual visit: patient, provider  I discussed the limitations of evaluation and management by telemedicine and the availability of in person appointments. The patient expressed understanding and agreed to proceed.  Telemedicine visit is a necessity given the COVID-19 restrictions in place at the current time.  HPI: 75 y/o WF with DM, chronic combined syst/diast HF, hx of TAVR, chronic LE edema, insomnia, and morbid obesity who is being seen today for cough as well as for chronic illness f/u. Onset 06/15/20: coughs a lot, no fever.  Some generalized fatigue.  Some chest rattle/wheeze.  +nasal mucous/obst.  No HA or sinus pressure/pain.  Has lost sense of taste.  Has felt pretty constant sensation of mild SOB.  Has tried nyquil, mucinex, cough drops.  Does not have an inhaler.    DM: last visit her a1c was 7.5% and I recommended either signif diet improvement or restart metformin.  She declined restart of metformin. Gluc 128 this morning. Says consistently <130 fasting.    Says chronic bilat LL swelling was worse when riding on a bus many hours per day on a trip to [place] last month. Didn't take her fluid pill for a couple days when riding bus.  Two small blisters on left foot, says these are resolving. Says this has all been stable since coming home a couple weeks ago.   Neuropathic feet/LL's pain: DPN. No meds except occ prn use of vicodin, uses very responsibly.  Still only using this "now and then". Uses ambien every night for sleep long term. PMP AWARE reviewed today: most recent rx for Lorrin Mais was filled 04/22/20, # 25, rx by me. Most recent vicodin rx filled 01/16/20, #60, rx by me. No red  flags.   ROS: See pertinent positives and negatives per HPI.  Past Medical History:  Diagnosis Date  . CAD (coronary artery disease) 01/2019   Noted on CT angio chest during the time of her covid illness.  EKG with evidence of inferior and inferolateral MIs.  . Chronic combined systolic and diastolic CHF (congestive heart failure) ([place]) 01/2019  . COVID-19 virus infection 01/2019   Covid test + 01/26/19-->acute hypox RF ([facility]).  . Diabetes mellitus    Type 2  . Edema of both lower extremities due to peripheral venous insufficiency   . Heart murmur, systolic 79/8921   Never could get pt to get ECHO arranged in her hometown [place], [place]  Finally, when she was dx'd with breast cancer 06/2016, she had to get echo and it showed severe AS and she subsequently got TAVR.  Marland Kitchen Hepatic steatosis 2020  . History of left breast cancer 06/17/2016   Invasive ductal carcinoma-->Left mastectomy with axillary lymph node dissection, +radiation,+chemo (all done in [place], [place]).  Marland Kitchen HTN (hypertension), benign   . Hx of total knee replacement, bilateral 2018  . Hypertriglyceridemia   . Obesity   . Osteoarthritis    low back, hips, knees  . S/P TAVR (transcatheter aortic valve replacement) 07/2016   [facility]  . Transaminasemia mild 4/11   hep panel- neg  . Venous reflux 06/2019   L saphenous    Past Surgical History:  Procedure Laterality Date  . APPENDECTOMY  1986  . LE  arterial dopplers  06/2019   by vascular MD---normal in [place]  . MASTECTOMY WITH AXILLARY LYMPH NODE DISSECTION  06/2016   [place], [place]  . REPLACEMENT TOTAL KNEE BILATERAL  2018/19   [place], [address] ABDOMINAL HYSTERECTOMY  1986  . TRANSCATHETER AORTIC VALVE REPLACEMENT, TRANSFEMORAL  07/2016   [facility].  (Dr. Aline Brochure?)  Pt states she got f/u echo approx 01/2018.  Marland Kitchen TRANSTHORACIC ECHOCARDIOGRAM  01/2019   during covid illness->EF 40-45%, DD, global hypokinesis, prosthetic AV OK.     Current Outpatient  Medications:  .  ASPIRIN 81 PO, Take 81 mg by mouth., Disp: , Rfl:  .  azithromycin (ZITHROMAX) 250 MG tablet, 2 tabs po qd x 1d, then 1 tab po qd x 4d, Disp: 6 tablet, Rfl: 0 .  furosemide (LASIX) 40 MG tablet, 1 tab po qd, Disp: 90 tablet, Rfl: 3 .  HYDROcodone-homatropine (HYCODAN) 5-1.5 MG/5ML syrup, 1-2 tsp po q8h prn cough, Disp: 120 mL, Rfl: 0 .  predniSONE (DELTASONE) 20 MG tablet, 2 tabs po qd x 5d, then 1 tab po qd x 5d, Disp: 15 tablet, Rfl: 0 .  zolpidem (AMBIEN) 10 MG tablet, TAKE 1 TABLET BY MOUTH ONCE DAILY AT BEDTIME AS NEEDED FOR SLEEP, Disp: 90 tablet, Rfl: 1 .  acetaminophen (TYLENOL) 325 MG tablet, Take by mouth. (Patient not taking: Reported on 06/25/2020), Disp: , Rfl:  .  HYDROcodone-acetaminophen (NORCO/VICODIN) 5-325 MG tablet, 1-2 tabs po bid as needed for feet pain (Patient not taking: Reported on 06/25/2020), Disp: 60 tablet, Rfl: 0  EXAM:  VITALS per patient if applicable:   Vitals with BMI 06/25/2020 04/08/2020 03/27/2020  Height - 5\' 4"  5\' 4"   Weight 211 lbs 216 lbs 10 oz 219 lbs 10 oz  BMI - 48.18 56.31  Systolic 497 026 378  Diastolic 72 78 74  Pulse - 84 79     GENERAL: alert, oriented, appears well and in no acute distress  HEENT: atraumatic, conjunttiva clear, no obvious abnormalities on inspection of external nose and ears  NECK: normal movements of the head and neck  LUNGS: on inspection no signs of respiratory distress, breathing rate appears normal, no obvious gross SOB, gasping or wheezing  CV: no obvious cyanosis  MS: moves all visible extremities without noticeable abnormality  PSYCH/NEURO: pleasant and cooperative, no obvious depression or anxiety, speech and thought processing grossly intact  LABS: none today    Chemistry      Component Value Date/Time   NA 137 03/27/2020 0910   NA 137 01/31/2019 0000   K 4.4 03/27/2020 0910   CL 98 03/27/2020 0910   CO2 30 03/27/2020 0910   BUN 26 (H) 03/27/2020 0910   BUN 25 (A) 01/31/2019  0000   CREATININE 0.96 03/27/2020 0910   CREATININE 1.13 (H) 05/23/2019 1409   GLU 166 01/31/2019 0000      Component Value Date/Time   CALCIUM 9.8 03/27/2020 0910   ALKPHOS 74 03/27/2020 0910   AST 18 03/27/2020 0910   ALT 13 03/27/2020 0910   BILITOT 0.9 03/27/2020 0910     Lab Results  Component Value Date   HGBA1C 7.5 (H) 03/27/2020   Lab Results  Component Value Date   WBC 7.3 03/27/2020   HGB 14.9 03/27/2020   HCT 45.5 03/27/2020   MCV 88.2 03/27/2020   PLT 170.0 03/27/2020   Lab Results  Component Value Date   CHOL 121 03/27/2020   HDL 38.00 (L) 03/27/2020   LDLCALC 63 03/27/2020  TRIG 101.0 03/27/2020   CHOLHDL 3 03/27/2020   Lab Results  Component Value Date   TSH 4.20 03/27/2020    ASSESSMENT AND PLAN:  Discussed the following assessment and plan:  1) Acute URI with bronchitis:  Prednisone 40mg  qd x 5d, then 20mg  qd x 5d. Azith x 5d. Hycodan susp, 1-2 tsp q8hr prn cough, #120 ml. Signs/symptoms to call or return for were reviewed and pt expressed understanding.  2) DM 2: good control per home monitoring.  No meds. She'll call for lab appt when feeling improved : bmet and a1c ordered.  3) Chronic bilat LL edema: signif venous insufficiency + chronic combined syst/diast HF. Stable except for recent acute worsening due to prolonged dec mobility/not taking lasix during long bus trip. Cont lasix 40mg  qd. BMET--future.  4) Chronic bilat LL neuropathic feet pain: only occ has to use vicodin but this works well for her. CSC UTD. No new rx for this med needed today.  5) Insomnia: doing well with long term use of ambien. CSC UTD. No new rx for this med needed today.  I discussed the assessment and treatment plan with the patient. The patient was provided an opportunity to ask questions and all were answered. The patient agreed with the plan and demonstrated an understanding of the instructions.   F/u: 3 mo  Signed:  Crissie Sickles, MD            06/25/2020

## 2020-08-26 ENCOUNTER — Other Ambulatory Visit: Payer: Self-pay | Admitting: Family Medicine

## 2020-09-01 ENCOUNTER — Other Ambulatory Visit: Payer: Self-pay

## 2020-09-01 ENCOUNTER — Other Ambulatory Visit: Payer: Self-pay | Admitting: Family Medicine

## 2020-09-01 MED ORDER — FUROSEMIDE 40 MG PO TABS: ORAL_TABLET | ORAL | 3 refills | Status: AC

## 2020-09-01 NOTE — Telephone Encounter (Signed)
Pharmacy questioning dosage.  Please be aware and contact pharmacy and contact patient  [phone].

## 2020-09-01 NOTE — Telephone Encounter (Signed)
Spoke with Bonnie Kane at [facility] regarding rx dosage, pt only has 4 pills left. Rx was not received b/c it was under no print. New rx sent with sig directions, 1 tab po qd.

## 2020-09-08 ENCOUNTER — Telehealth: Payer: Self-pay

## 2020-09-08 NOTE — Telephone Encounter (Signed)
LM for pt to returncall

## 2020-09-08 NOTE — Telephone Encounter (Signed)
Spoke with pt's husband and advised they go to ED for evaluation. He declined stating he was there for 5 days, had a catheter placed which ended in a result of surgery the last time he went. He would like to know if they can take ivermectin 3mg  medication based on weight, he has some left over. He took 5mg  and she took 6mg  yesterday.  Please Advise

## 2020-09-08 NOTE — Telephone Encounter (Signed)
Patient is COVID positive, along with her husband, who is also a patient of Dr. Idelle Leech.  Short of breath when up and walking around.  Please advise.  [phone]

## 2020-09-09 NOTE — Telephone Encounter (Signed)
Spoke with patient regarding PCP recommendations and strongly advised to be evaluated.

## 2020-09-09 NOTE — Telephone Encounter (Signed)
LM for pt to return call regarding recommendations.  

## 2020-09-09 NOTE — Telephone Encounter (Signed)
I strongly agree with recommendation to be seen in the emergency department b/c she is such high risk for covid complications and needs to be completely evaluated to see if they need to consider admission to hospital for observation even if her covid is mild or moderate.-thx

## 2020-09-16 ENCOUNTER — Ambulatory Visit: Payer: Medicare Other | Admitting: Family Medicine

## 2020-10-15 ENCOUNTER — Telehealth: Payer: Self-pay

## 2020-10-15 ENCOUNTER — Other Ambulatory Visit: Payer: Self-pay

## 2020-10-15 ENCOUNTER — Telehealth: Payer: Medicare Other | Admitting: Emergency Medicine

## 2020-10-15 ENCOUNTER — Telehealth (INDEPENDENT_AMBULATORY_CARE_PROVIDER_SITE_OTHER): Payer: Medicare Other | Admitting: Family Medicine

## 2020-10-15 VITALS — BP 114/71 | HR 80

## 2020-10-15 DIAGNOSIS — R059 Cough, unspecified: Secondary | ICD-10-CM

## 2020-10-15 MED ORDER — PREDNISONE 20 MG PO TABS: 20.0000 mg | ORAL_TABLET | Freq: Every day | ORAL | 0 refills | Status: AC

## 2020-10-15 MED ORDER — BENZONATATE 100 MG PO CAPS: 100.0000 mg | ORAL_CAPSULE | Freq: Three times a day (TID) | ORAL | 0 refills | Status: AC | PRN

## 2020-10-15 MED ORDER — AZITHROMYCIN 250 MG PO TABS: ORAL_TABLET | ORAL | 0 refills | Status: AC

## 2020-10-15 NOTE — Patient Instructions (Addendum)
-  I sent the medication(s) we discussed to your pharmacy: Meds ordered this encounter  Medications   azithromycin (ZITHROMAX) 250 MG tablet    Sig: 2 tabs day 1, then one tab daily    Dispense:  6 tablet    Refill:  0   predniSONE (DELTASONE) 20 MG tablet    Sig: Take 1 tablet (20 mg total) by mouth daily with breakfast.    Dispense:  4 tablet    Refill:  0   benzonatate (TESSALON PERLES) 100 MG capsule    Sig: Take 1 capsule (100 mg total) by mouth 3 (three) times daily as needed.    Dispense:  20 capsule    Refill:  0   Schedule a follow up inperson with your primary care office in 1 week.   I hope you are feeling better soon!  Monitor your blood sugars and see your primary care doctor if they are running higher than 150 when you check them in the morning while on the steroid.   Seek in person care promptly if your symptoms worsen, new concerns arise or you are not improving with treatment.  It was nice to meet you today. I help [place] out with telemedicine visits on Tuesdays and Thursdays and am available for visits on those days. If you have any concerns or questions following this visit please schedule a follow up visit with your Primary Care doctor or seek care at a local urgent care clinic to avoid delays in care.

## 2020-10-15 NOTE — Telephone Encounter (Signed)
Spoke with pt to advise meds could not be sent in and recommendations to be seen, she was unhappy with this information stating "this was ridiculous and he has done this before in the past so I don't want to hear that. I'm starting to think he doesn't care about his patients like he used to. She is not some dope addict so what would be the issue?". I explained that since the medications previously prescribed are steroids and antibiotics, an appointment would be required and her request could not be fulfilled by [place] at this time. She stated she would be finding another doctor and hung up.

## 2020-10-15 NOTE — Telephone Encounter (Signed)
Patient states she has COVID again.  Cough is very bad and is requesting refill of med that Dr. Anitra Lauth prescribed back in May.  I was not able to locate name of drug she was referring to. Patient is aware Dr. Anitra Lauth leaving today at lunch and not returning to office until 7/14.  She is requesting for someone to ask before he leaves office.  Please call patient [phone].

## 2020-10-15 NOTE — Progress Notes (Signed)
Virtual Visit via Telephone Note  I connected with Bonnie Kane on 10/15/20 at  6:40 PM EDT by telephone and verified that I am speaking with the correct person using two identifiers.   I discussed the limitations, risks, security and privacy concerns of performing an evaluation and management service by telephone and the availability of in person appointments. I also discussed with the patient that there may be a patient responsible charge related to this service. The patient expressed understanding and agreed to proceed.  Location patient: home, [place] Location provider: work or home office Participants present for the call: patient, provider Patient did not have a visit with me in the prior 7 days to address this/these issue(s).   History of Present Illness:  Acute telemedicine visit for a cough: -Onset: with covid in may  -Symptoms include: persistent cough since in May, hacking cough -Denies: denies CP, SOB, wheezing, mucus production, sinus congestion, night sweats, fatigue, weight loss, hx of lung disease or smoking -reports in the past with these issues require oral prednisone and a "zpack" -Has tried: vics which helps a little -reports checks her sugars every morning and they have been "great" in the 120s, reports prednisone never causes her sugars to go up -Pertinent past medical history: has had covid twice -Pertinent medication allergies:  Allergies  Allergen Reactions   Penicillins     REACTION: neck/throat swelling   Gabapentin Other (See Comments)    emotional  -COVID-19 vaccine status: not vaccinated   Observations/Objective: Patient sounds cheerful and well on the phone. I do not appreciate any SOB. Speech and thought processing are grossly intact. Patient reported vitals:  Assessment and Plan:  Cough  -we discussed possible serious and likely etiologies, options for evaluation and workup, limitations of telemedicine visit vs in person visit, treatment, treatment  risks and precautions. Pt prefers to treat via telemedicine empirically rather than in person at this moment. Query post viral/post covid, vs bronchitis, vs other. She feels strongly that she needs a course of prednisone and a zpack. It would be reasonable to try this at this point and tessalon for cough. Tried to convince her to try an inhaler instead of the prednisone due to her diabetes, but she prefers a low dose of oral prednisone. Advised follow up with PCP in 1 weeks to reassess. She agrees to schedule.  Advised to seek prompt in person care if worsening, new symptoms arise, or if is not improving with treatment. Advised of options for inperson care in case PCP office not available. Did let the patient know that I only do telemedicine shifts for [place] on Tuesdays and Thursdays and advised a follow up visit with PCP or at an [facility] if has further questions or concerns.   Follow Up Instructions:  I did not refer this patient for an OV with me in the next 24 hours for this/these issue(s).  I discussed the assessment and treatment plan with the patient. The patient was provided an opportunity to ask questions and all were answered. The patient agreed with the plan and demonstrated an understanding of the instructions.   I spent 18 minutes on the date of this visit in the care of this patient. See summary of tasks completed to properly care for this patient in the detailed notes above which also included counseling of above, review of PMH, medications, allergies, evaluation of the patient and ordering and/or  instructing patient on testing and care options.     Bonnie Kern, DO

## 2020-10-15 NOTE — Telephone Encounter (Signed)
Spoke with pt regarding current status; ribs are sore from coughing, barely able to stand. Last had covid 1 month ago, during last appt 06/25/20 given rx for hycodan syrup, zithromacx 250mg , and prednisone 20mg .    Please advise, thanks.

## 2020-10-15 NOTE — Telephone Encounter (Signed)
It is inappropriate to just send these meds in at this time. If she is still that sick at this time she should be seen.

## 2020-10-15 NOTE — Telephone Encounter (Signed)
[facility] - Client TELEPHONE ADVICE RECORD [facility] Patient Name: Bonnie Kane Gender: Female DOB: 1945/10/15 Age: 75 Y 49 M 13 D Return Phone Number: [phone] (Primary) Address: City/ State/ Zip: [place]  [address] Client  [facility] - Client Client [facility] - Day Physician Crissie Sickles - MD Contact Type Call Who Is Calling Patient / Member / Family / Caregiver Call Type Triage / Clinical Relationship To Patient Self Return Phone Number [phone] (Primary) Chief Complaint BREATHING - shortness of breath or sounds breathless Reason for Call Symptomatic / Request for [name] states she has a cough and difficulty breathing due to the cough. She would like a refill on her Benzonatate Translation No Nurse Assessment Nurse: Doyle Askew, RN, Beth Date/Time (Eastern Time): 10/15/2020 10:39:56 AM Confirm and document reason for call. If symptomatic, describe symptoms. ---Caller states she has a cough and difficulty breathing due to the cough. Caller states she would like a refill on her Benzonatate. Caller states she was positive for Covid May 14. Does the patient have any new or worsening symptoms? ---Yes Will a triage be completed? ---Yes Related visit to physician within the last 2 weeks? ---No Does the PT have any chronic conditions? (i.e. diabetes, asthma, this includes High risk factors for pregnancy, etc.) ---No Is this a behavioral health or substance abuse call? ---No Guidelines Guideline Title Affirmed Question Affirmed Notes Nurse Date/Time [name] Time) COVID-19 - Persisting Symptoms Follow-up Call [1] PERSISTING SYMPTOMS OF COVID-19 AND [2] NEW symptom AND [3] could be serious Doyle Askew, RN, Beth 10/15/2020 10:41:07 AM Disp. Time [name] Time) Disposition Final User 10/15/2020 10:38:23 AM Send to Urgent Merrily Pew PLEASE  NOTE: All timestamps contained within this report are represented as [place] Standard Time. CONFIDENTIALTY NOTICE: This fax transmission is intended only for the addressee. It contains information that is legally privileged, confidential or otherwise protected from use or disclosure. If you are not the intended recipient, you are strictly prohibited from reviewing, disclosing, copying using or disseminating any of this information or taking any action in reliance on or regarding this information. If you have received this fax in error, please notify us immediately by telephone so that we can arrange for its return to [place]. Phone: [phone], Toll-Free: [phone], Fax: [phone] Page: 2 of 2 Call Id: [id number] 10/15/2020 10:53:48 AM Call PCP Now Yes Doyle Askew, RN, Beth Caller Disagree/Comply Comply Caller Understands Yes PreDisposition Call Doctor Care Advice Given Per Guideline CALL PCP NOW: * You need to discuss this with your doctor (or NP/PA). CALL BACK IF: CARE ADVICE given per COVID-19 - Persisting Symptoms Follow-Up Call (Adult) guideline. * You become worse Comments User: [name], RN Date/Time [name] Time): 10/15/2020 10:53:46 AM Spoke with office and script has been called in as caller did already talk to office. Nurse at office states she will check with physician to see if caller needs to be seen due to ongoing cough, and if so, will call caller back - message relayed to caller. Caller states understanding. Referrals REFERRED TO PCP OFFICE

## 2020-10-21 ENCOUNTER — Telehealth: Payer: Self-pay | Admitting: Family Medicine

## 2020-10-21 NOTE — Telephone Encounter (Signed)
Left message for patient to schedule Annual Wellness Visit.  Please schedule with Nurse Health Advisor Leroy Kennedy, RN at [facility].

## 2020-11-17 DIAGNOSIS — I959 Hypotension, unspecified: Secondary | ICD-10-CM | POA: Diagnosis not present

## 2020-11-17 DIAGNOSIS — R1310 Dysphagia, unspecified: Secondary | ICD-10-CM | POA: Diagnosis not present

## 2020-11-17 DIAGNOSIS — N183 Chronic kidney disease, stage 3 unspecified: Secondary | ICD-10-CM | POA: Diagnosis not present

## 2020-11-17 DIAGNOSIS — Z87891 Personal history of nicotine dependence: Secondary | ICD-10-CM | POA: Diagnosis not present

## 2020-11-17 DIAGNOSIS — K769 Liver disease, unspecified: Secondary | ICD-10-CM | POA: Diagnosis not present

## 2020-11-17 DIAGNOSIS — E8779 Other fluid overload: Secondary | ICD-10-CM | POA: Diagnosis not present

## 2020-11-17 DIAGNOSIS — I440 Atrioventricular block, first degree: Secondary | ICD-10-CM | POA: Diagnosis not present

## 2020-11-17 DIAGNOSIS — R7989 Other specified abnormal findings of blood chemistry: Secondary | ICD-10-CM | POA: Diagnosis not present

## 2020-11-17 DIAGNOSIS — J9811 Atelectasis: Secondary | ICD-10-CM | POA: Diagnosis not present

## 2020-11-17 DIAGNOSIS — E785 Hyperlipidemia, unspecified: Secondary | ICD-10-CM | POA: Diagnosis not present

## 2020-11-17 DIAGNOSIS — I469 Cardiac arrest, cause unspecified: Secondary | ICD-10-CM | POA: Diagnosis not present

## 2020-11-17 DIAGNOSIS — I429 Cardiomyopathy, unspecified: Secondary | ICD-10-CM | POA: Diagnosis not present

## 2020-11-17 DIAGNOSIS — Z6841 Body Mass Index (BMI) 40.0 and over, adult: Secondary | ICD-10-CM | POA: Diagnosis not present

## 2020-11-17 DIAGNOSIS — R34 Anuria and oliguria: Secondary | ICD-10-CM | POA: Diagnosis not present

## 2020-11-17 DIAGNOSIS — Z953 Presence of xenogenic heart valve: Secondary | ICD-10-CM | POA: Diagnosis not present

## 2020-11-17 DIAGNOSIS — N189 Chronic kidney disease, unspecified: Secondary | ICD-10-CM | POA: Diagnosis not present

## 2020-11-17 DIAGNOSIS — E669 Obesity, unspecified: Secondary | ICD-10-CM | POA: Diagnosis not present

## 2020-11-17 DIAGNOSIS — J811 Chronic pulmonary edema: Secondary | ICD-10-CM | POA: Diagnosis not present

## 2020-11-17 DIAGNOSIS — I5023 Acute on chronic systolic (congestive) heart failure: Secondary | ICD-10-CM | POA: Diagnosis not present

## 2020-11-17 DIAGNOSIS — I428 Other cardiomyopathies: Secondary | ICD-10-CM | POA: Diagnosis not present

## 2020-11-17 DIAGNOSIS — G9349 Other encephalopathy: Secondary | ICD-10-CM | POA: Diagnosis not present

## 2020-11-17 DIAGNOSIS — I491 Atrial premature depolarization: Secondary | ICD-10-CM | POA: Diagnosis not present

## 2020-11-17 DIAGNOSIS — I447 Left bundle-branch block, unspecified: Secondary | ICD-10-CM | POA: Diagnosis not present

## 2020-11-17 DIAGNOSIS — R0602 Shortness of breath: Secondary | ICD-10-CM | POA: Diagnosis not present

## 2020-11-17 DIAGNOSIS — E8770 Fluid overload, unspecified: Secondary | ICD-10-CM | POA: Diagnosis not present

## 2020-11-17 DIAGNOSIS — I272 Pulmonary hypertension, unspecified: Secondary | ICD-10-CM | POA: Diagnosis not present

## 2020-11-17 DIAGNOSIS — J9601 Acute respiratory failure with hypoxia: Secondary | ICD-10-CM | POA: Diagnosis not present

## 2020-11-17 DIAGNOSIS — K760 Fatty (change of) liver, not elsewhere classified: Secondary | ICD-10-CM | POA: Diagnosis not present

## 2020-11-17 DIAGNOSIS — Z952 Presence of prosthetic heart valve: Secondary | ICD-10-CM | POA: Diagnosis not present

## 2020-11-17 DIAGNOSIS — B9620 Unspecified Escherichia coli [E. coli] as the cause of diseases classified elsewhere: Secondary | ICD-10-CM | POA: Diagnosis not present

## 2020-11-17 DIAGNOSIS — N170 Acute kidney failure with tubular necrosis: Secondary | ICD-10-CM | POA: Diagnosis not present

## 2020-11-17 DIAGNOSIS — R079 Chest pain, unspecified: Secondary | ICD-10-CM | POA: Diagnosis not present

## 2020-11-17 DIAGNOSIS — Z452 Encounter for adjustment and management of vascular access device: Secondary | ICD-10-CM | POA: Diagnosis not present

## 2020-11-17 DIAGNOSIS — E162 Hypoglycemia, unspecified: Secondary | ICD-10-CM | POA: Diagnosis not present

## 2020-11-17 DIAGNOSIS — I509 Heart failure, unspecified: Secondary | ICD-10-CM | POA: Diagnosis not present

## 2020-11-17 DIAGNOSIS — E11649 Type 2 diabetes mellitus with hypoglycemia without coma: Secondary | ICD-10-CM | POA: Diagnosis present

## 2020-11-17 DIAGNOSIS — Z9012 Acquired absence of left breast and nipple: Secondary | ICD-10-CM | POA: Diagnosis not present

## 2020-11-17 DIAGNOSIS — I348 Other nonrheumatic mitral valve disorders: Secondary | ICD-10-CM | POA: Diagnosis not present

## 2020-11-17 DIAGNOSIS — B9689 Other specified bacterial agents as the cause of diseases classified elsewhere: Secondary | ICD-10-CM | POA: Diagnosis not present

## 2020-11-17 DIAGNOSIS — R1013 Epigastric pain: Secondary | ICD-10-CM | POA: Diagnosis not present

## 2020-11-17 DIAGNOSIS — Z90710 Acquired absence of both cervix and uterus: Secondary | ICD-10-CM | POA: Diagnosis not present

## 2020-11-17 DIAGNOSIS — J90 Pleural effusion, not elsewhere classified: Secondary | ICD-10-CM | POA: Diagnosis not present

## 2020-11-17 DIAGNOSIS — I4891 Unspecified atrial fibrillation: Secondary | ICD-10-CM | POA: Diagnosis present

## 2020-11-17 DIAGNOSIS — Z743 Need for continuous supervision: Secondary | ICD-10-CM | POA: Diagnosis not present

## 2020-11-17 DIAGNOSIS — N179 Acute kidney failure, unspecified: Secondary | ICD-10-CM | POA: Diagnosis not present

## 2020-11-17 DIAGNOSIS — Z8616 Personal history of COVID-19: Secondary | ICD-10-CM | POA: Diagnosis not present

## 2020-11-17 DIAGNOSIS — R0781 Pleurodynia: Secondary | ICD-10-CM | POA: Diagnosis not present

## 2020-11-17 DIAGNOSIS — K8020 Calculus of gallbladder without cholecystitis without obstruction: Secondary | ICD-10-CM | POA: Diagnosis not present

## 2020-11-17 DIAGNOSIS — E873 Alkalosis: Secondary | ICD-10-CM | POA: Diagnosis not present

## 2020-11-17 DIAGNOSIS — I5042 Chronic combined systolic (congestive) and diastolic (congestive) heart failure: Secondary | ICD-10-CM | POA: Diagnosis not present

## 2020-11-17 DIAGNOSIS — E1122 Type 2 diabetes mellitus with diabetic chronic kidney disease: Secondary | ICD-10-CM | POA: Diagnosis not present

## 2020-11-17 DIAGNOSIS — F5101 Primary insomnia: Secondary | ICD-10-CM | POA: Diagnosis not present

## 2020-11-17 DIAGNOSIS — E871 Hypo-osmolality and hyponatremia: Secondary | ICD-10-CM | POA: Diagnosis not present

## 2020-11-17 DIAGNOSIS — E119 Type 2 diabetes mellitus without complications: Secondary | ICD-10-CM | POA: Diagnosis not present

## 2020-11-17 DIAGNOSIS — N289 Disorder of kidney and ureter, unspecified: Secondary | ICD-10-CM | POA: Diagnosis not present

## 2020-11-17 DIAGNOSIS — R0600 Dyspnea, unspecified: Secondary | ICD-10-CM | POA: Diagnosis not present

## 2020-11-17 DIAGNOSIS — I208 Other forms of angina pectoris: Secondary | ICD-10-CM | POA: Diagnosis not present

## 2020-11-17 DIAGNOSIS — Z7409 Other reduced mobility: Secondary | ICD-10-CM | POA: Diagnosis not present

## 2020-11-17 DIAGNOSIS — I70293 Other atherosclerosis of native arteries of extremities, bilateral legs: Secondary | ICD-10-CM | POA: Diagnosis not present

## 2020-11-17 DIAGNOSIS — I452 Bifascicular block: Secondary | ICD-10-CM | POA: Diagnosis not present

## 2020-11-17 DIAGNOSIS — I493 Ventricular premature depolarization: Secondary | ICD-10-CM | POA: Diagnosis not present

## 2020-11-17 DIAGNOSIS — I444 Left anterior fascicular block: Secondary | ICD-10-CM | POA: Diagnosis not present

## 2020-11-17 DIAGNOSIS — I872 Venous insufficiency (chronic) (peripheral): Secondary | ICD-10-CM | POA: Diagnosis not present

## 2020-11-17 DIAGNOSIS — I454 Nonspecific intraventricular block: Secondary | ICD-10-CM | POA: Diagnosis not present

## 2020-11-17 DIAGNOSIS — E874 Mixed disorder of acid-base balance: Secondary | ICD-10-CM | POA: Diagnosis not present

## 2020-11-17 DIAGNOSIS — I2510 Atherosclerotic heart disease of native coronary artery without angina pectoris: Secondary | ICD-10-CM | POA: Diagnosis not present

## 2020-11-17 DIAGNOSIS — I340 Nonrheumatic mitral (valve) insufficiency: Secondary | ICD-10-CM | POA: Diagnosis not present

## 2020-11-17 DIAGNOSIS — I517 Cardiomegaly: Secondary | ICD-10-CM | POA: Diagnosis not present

## 2020-11-17 DIAGNOSIS — Z515 Encounter for palliative care: Secondary | ICD-10-CM | POA: Diagnosis not present

## 2020-11-17 DIAGNOSIS — K5730 Diverticulosis of large intestine without perforation or abscess without bleeding: Secondary | ICD-10-CM | POA: Diagnosis not present

## 2020-11-17 DIAGNOSIS — I2109 ST elevation (STEMI) myocardial infarction involving other coronary artery of anterior wall: Secondary | ICD-10-CM | POA: Diagnosis not present

## 2020-11-17 DIAGNOSIS — Z66 Do not resuscitate: Secondary | ICD-10-CM | POA: Diagnosis not present

## 2020-11-17 DIAGNOSIS — Z79899 Other long term (current) drug therapy: Secondary | ICD-10-CM | POA: Diagnosis not present

## 2020-11-17 DIAGNOSIS — R279 Unspecified lack of coordination: Secondary | ICD-10-CM | POA: Diagnosis not present

## 2020-11-17 DIAGNOSIS — I130 Hypertensive heart and chronic kidney disease with heart failure and stage 1 through stage 4 chronic kidney disease, or unspecified chronic kidney disease: Secondary | ICD-10-CM | POA: Diagnosis not present

## 2020-11-17 DIAGNOSIS — I4510 Unspecified right bundle-branch block: Secondary | ICD-10-CM | POA: Diagnosis not present

## 2020-11-17 DIAGNOSIS — I110 Hypertensive heart disease with heart failure: Secondary | ICD-10-CM | POA: Diagnosis not present

## 2020-11-17 DIAGNOSIS — R0902 Hypoxemia: Secondary | ICD-10-CM | POA: Diagnosis not present

## 2020-11-17 DIAGNOSIS — R109 Unspecified abdominal pain: Secondary | ICD-10-CM | POA: Diagnosis not present

## 2020-11-17 DIAGNOSIS — K8689 Other specified diseases of pancreas: Secondary | ICD-10-CM | POA: Diagnosis not present

## 2020-11-17 DIAGNOSIS — Z8674 Personal history of sudden cardiac arrest: Secondary | ICD-10-CM | POA: Diagnosis not present

## 2020-11-17 DIAGNOSIS — I5043 Acute on chronic combined systolic (congestive) and diastolic (congestive) heart failure: Secondary | ICD-10-CM | POA: Diagnosis not present

## 2020-11-17 DIAGNOSIS — R570 Cardiogenic shock: Secondary | ICD-10-CM | POA: Diagnosis present

## 2020-11-17 DIAGNOSIS — J9690 Respiratory failure, unspecified, unspecified whether with hypoxia or hypercapnia: Secondary | ICD-10-CM | POA: Diagnosis not present

## 2020-11-17 DIAGNOSIS — I350 Nonrheumatic aortic (valve) stenosis: Secondary | ICD-10-CM | POA: Diagnosis not present

## 2020-11-17 DIAGNOSIS — Z4901 Encounter for fitting and adjustment of extracorporeal dialysis catheter: Secondary | ICD-10-CM | POA: Diagnosis not present

## 2020-11-17 DIAGNOSIS — N390 Urinary tract infection, site not specified: Secondary | ICD-10-CM | POA: Diagnosis not present

## 2020-11-17 DIAGNOSIS — R0682 Tachypnea, not elsewhere classified: Secondary | ICD-10-CM | POA: Diagnosis not present

## 2020-11-17 DIAGNOSIS — R1011 Right upper quadrant pain: Secondary | ICD-10-CM | POA: Diagnosis not present

## 2020-11-17 DIAGNOSIS — K5900 Constipation, unspecified: Secondary | ICD-10-CM | POA: Diagnosis not present

## 2020-11-17 DIAGNOSIS — R188 Other ascites: Secondary | ICD-10-CM | POA: Diagnosis not present

## 2020-11-18 DIAGNOSIS — I491 Atrial premature depolarization: Secondary | ICD-10-CM | POA: Diagnosis not present

## 2020-11-18 DIAGNOSIS — I452 Bifascicular block: Secondary | ICD-10-CM | POA: Diagnosis not present

## 2020-11-18 DIAGNOSIS — I517 Cardiomegaly: Secondary | ICD-10-CM | POA: Diagnosis not present

## 2020-11-18 DIAGNOSIS — I70293 Other atherosclerosis of native arteries of extremities, bilateral legs: Secondary | ICD-10-CM | POA: Diagnosis not present

## 2020-11-18 DIAGNOSIS — I509 Heart failure, unspecified: Secondary | ICD-10-CM | POA: Diagnosis not present

## 2020-11-18 DIAGNOSIS — R188 Other ascites: Secondary | ICD-10-CM | POA: Diagnosis not present

## 2020-11-19 DIAGNOSIS — I491 Atrial premature depolarization: Secondary | ICD-10-CM | POA: Diagnosis not present

## 2020-11-19 DIAGNOSIS — Z952 Presence of prosthetic heart valve: Secondary | ICD-10-CM | POA: Diagnosis not present

## 2020-11-19 DIAGNOSIS — I4510 Unspecified right bundle-branch block: Secondary | ICD-10-CM | POA: Diagnosis not present

## 2020-11-19 DIAGNOSIS — I5023 Acute on chronic systolic (congestive) heart failure: Secondary | ICD-10-CM | POA: Diagnosis not present

## 2020-11-19 DIAGNOSIS — I872 Venous insufficiency (chronic) (peripheral): Secondary | ICD-10-CM | POA: Diagnosis not present

## 2020-11-19 DIAGNOSIS — Z79899 Other long term (current) drug therapy: Secondary | ICD-10-CM | POA: Diagnosis not present

## 2020-11-19 DIAGNOSIS — I110 Hypertensive heart disease with heart failure: Secondary | ICD-10-CM | POA: Diagnosis not present

## 2020-11-19 DIAGNOSIS — E785 Hyperlipidemia, unspecified: Secondary | ICD-10-CM | POA: Diagnosis not present

## 2020-11-19 DIAGNOSIS — R7989 Other specified abnormal findings of blood chemistry: Secondary | ICD-10-CM | POA: Diagnosis not present

## 2020-11-20 DIAGNOSIS — I872 Venous insufficiency (chronic) (peripheral): Secondary | ICD-10-CM | POA: Diagnosis not present

## 2020-11-20 DIAGNOSIS — I444 Left anterior fascicular block: Secondary | ICD-10-CM | POA: Diagnosis not present

## 2020-11-20 DIAGNOSIS — Z952 Presence of prosthetic heart valve: Secondary | ICD-10-CM | POA: Diagnosis not present

## 2020-11-20 DIAGNOSIS — I110 Hypertensive heart disease with heart failure: Secondary | ICD-10-CM | POA: Diagnosis not present

## 2020-11-20 DIAGNOSIS — E785 Hyperlipidemia, unspecified: Secondary | ICD-10-CM | POA: Diagnosis not present

## 2020-11-20 DIAGNOSIS — I493 Ventricular premature depolarization: Secondary | ICD-10-CM | POA: Diagnosis not present

## 2020-11-20 DIAGNOSIS — I5023 Acute on chronic systolic (congestive) heart failure: Secondary | ICD-10-CM | POA: Diagnosis not present

## 2020-11-20 DIAGNOSIS — R7989 Other specified abnormal findings of blood chemistry: Secondary | ICD-10-CM | POA: Diagnosis not present

## 2020-11-20 DIAGNOSIS — I4510 Unspecified right bundle-branch block: Secondary | ICD-10-CM | POA: Diagnosis not present

## 2020-11-21 DIAGNOSIS — Z952 Presence of prosthetic heart valve: Secondary | ICD-10-CM | POA: Diagnosis not present

## 2020-11-21 DIAGNOSIS — I444 Left anterior fascicular block: Secondary | ICD-10-CM | POA: Diagnosis not present

## 2020-11-21 DIAGNOSIS — R7989 Other specified abnormal findings of blood chemistry: Secondary | ICD-10-CM | POA: Diagnosis not present

## 2020-11-21 DIAGNOSIS — E785 Hyperlipidemia, unspecified: Secondary | ICD-10-CM | POA: Diagnosis not present

## 2020-11-21 DIAGNOSIS — I4510 Unspecified right bundle-branch block: Secondary | ICD-10-CM | POA: Diagnosis not present

## 2020-11-21 DIAGNOSIS — I493 Ventricular premature depolarization: Secondary | ICD-10-CM | POA: Diagnosis not present

## 2020-11-21 DIAGNOSIS — I872 Venous insufficiency (chronic) (peripheral): Secondary | ICD-10-CM | POA: Diagnosis not present

## 2020-11-21 DIAGNOSIS — I5023 Acute on chronic systolic (congestive) heart failure: Secondary | ICD-10-CM | POA: Diagnosis not present

## 2020-11-21 DIAGNOSIS — I110 Hypertensive heart disease with heart failure: Secondary | ICD-10-CM | POA: Diagnosis not present

## 2020-11-22 DIAGNOSIS — E119 Type 2 diabetes mellitus without complications: Secondary | ICD-10-CM | POA: Diagnosis not present

## 2020-11-22 DIAGNOSIS — E785 Hyperlipidemia, unspecified: Secondary | ICD-10-CM | POA: Diagnosis not present

## 2020-11-22 DIAGNOSIS — R188 Other ascites: Secondary | ICD-10-CM | POA: Diagnosis not present

## 2020-11-22 DIAGNOSIS — R109 Unspecified abdominal pain: Secondary | ICD-10-CM | POA: Diagnosis not present

## 2020-11-22 DIAGNOSIS — I5023 Acute on chronic systolic (congestive) heart failure: Secondary | ICD-10-CM | POA: Diagnosis not present

## 2020-11-22 DIAGNOSIS — I110 Hypertensive heart disease with heart failure: Secondary | ICD-10-CM | POA: Diagnosis not present

## 2020-11-23 DIAGNOSIS — Z452 Encounter for adjustment and management of vascular access device: Secondary | ICD-10-CM | POA: Diagnosis not present

## 2020-11-23 DIAGNOSIS — E8770 Fluid overload, unspecified: Secondary | ICD-10-CM | POA: Diagnosis not present

## 2020-11-23 DIAGNOSIS — R188 Other ascites: Secondary | ICD-10-CM | POA: Diagnosis not present

## 2020-11-23 DIAGNOSIS — I517 Cardiomegaly: Secondary | ICD-10-CM | POA: Diagnosis not present

## 2020-11-23 DIAGNOSIS — J90 Pleural effusion, not elsewhere classified: Secondary | ICD-10-CM | POA: Diagnosis not present

## 2020-11-23 DIAGNOSIS — E162 Hypoglycemia, unspecified: Secondary | ICD-10-CM | POA: Diagnosis not present

## 2020-11-23 DIAGNOSIS — I454 Nonspecific intraventricular block: Secondary | ICD-10-CM | POA: Diagnosis not present

## 2020-11-23 DIAGNOSIS — I429 Cardiomyopathy, unspecified: Secondary | ICD-10-CM | POA: Diagnosis not present

## 2020-11-23 DIAGNOSIS — I428 Other cardiomyopathies: Secondary | ICD-10-CM | POA: Diagnosis not present

## 2020-11-23 DIAGNOSIS — Z952 Presence of prosthetic heart valve: Secondary | ICD-10-CM | POA: Diagnosis not present

## 2020-11-23 DIAGNOSIS — Z8674 Personal history of sudden cardiac arrest: Secondary | ICD-10-CM | POA: Diagnosis not present

## 2020-11-23 DIAGNOSIS — Z6841 Body Mass Index (BMI) 40.0 and over, adult: Secondary | ICD-10-CM | POA: Diagnosis not present

## 2020-11-23 DIAGNOSIS — N179 Acute kidney failure, unspecified: Secondary | ICD-10-CM | POA: Diagnosis not present

## 2020-11-23 DIAGNOSIS — I469 Cardiac arrest, cause unspecified: Secondary | ICD-10-CM | POA: Diagnosis not present

## 2020-11-23 DIAGNOSIS — R7989 Other specified abnormal findings of blood chemistry: Secondary | ICD-10-CM | POA: Diagnosis not present

## 2020-11-23 DIAGNOSIS — I959 Hypotension, unspecified: Secondary | ICD-10-CM | POA: Diagnosis not present

## 2020-11-23 DIAGNOSIS — I348 Other nonrheumatic mitral valve disorders: Secondary | ICD-10-CM | POA: Diagnosis not present

## 2020-11-23 DIAGNOSIS — Z7409 Other reduced mobility: Secondary | ICD-10-CM | POA: Diagnosis not present

## 2020-11-23 DIAGNOSIS — I5023 Acute on chronic systolic (congestive) heart failure: Secondary | ICD-10-CM | POA: Diagnosis not present

## 2020-11-23 DIAGNOSIS — I110 Hypertensive heart disease with heart failure: Secondary | ICD-10-CM | POA: Diagnosis not present

## 2020-11-23 DIAGNOSIS — E785 Hyperlipidemia, unspecified: Secondary | ICD-10-CM | POA: Diagnosis not present

## 2020-11-23 DIAGNOSIS — I440 Atrioventricular block, first degree: Secondary | ICD-10-CM | POA: Diagnosis not present

## 2020-11-23 DIAGNOSIS — I509 Heart failure, unspecified: Secondary | ICD-10-CM | POA: Diagnosis not present

## 2020-11-23 DIAGNOSIS — R0902 Hypoxemia: Secondary | ICD-10-CM | POA: Diagnosis not present

## 2020-11-23 DIAGNOSIS — R570 Cardiogenic shock: Secondary | ICD-10-CM | POA: Diagnosis not present

## 2020-11-23 DIAGNOSIS — I2109 ST elevation (STEMI) myocardial infarction involving other coronary artery of anterior wall: Secondary | ICD-10-CM | POA: Diagnosis not present

## 2020-11-23 DIAGNOSIS — E8779 Other fluid overload: Secondary | ICD-10-CM | POA: Diagnosis not present

## 2020-11-24 DIAGNOSIS — N179 Acute kidney failure, unspecified: Secondary | ICD-10-CM | POA: Diagnosis not present

## 2020-11-24 DIAGNOSIS — I5043 Acute on chronic combined systolic (congestive) and diastolic (congestive) heart failure: Secondary | ICD-10-CM | POA: Diagnosis not present

## 2020-11-24 DIAGNOSIS — N390 Urinary tract infection, site not specified: Secondary | ICD-10-CM | POA: Diagnosis not present

## 2020-11-24 DIAGNOSIS — Z6841 Body Mass Index (BMI) 40.0 and over, adult: Secondary | ICD-10-CM | POA: Diagnosis not present

## 2020-11-24 DIAGNOSIS — Z952 Presence of prosthetic heart valve: Secondary | ICD-10-CM | POA: Diagnosis not present

## 2020-11-24 DIAGNOSIS — E785 Hyperlipidemia, unspecified: Secondary | ICD-10-CM | POA: Diagnosis not present

## 2020-11-24 DIAGNOSIS — I272 Pulmonary hypertension, unspecified: Secondary | ICD-10-CM | POA: Diagnosis not present

## 2020-11-24 DIAGNOSIS — J9601 Acute respiratory failure with hypoxia: Secondary | ICD-10-CM | POA: Diagnosis not present

## 2020-11-24 DIAGNOSIS — B9689 Other specified bacterial agents as the cause of diseases classified elsewhere: Secondary | ICD-10-CM | POA: Diagnosis not present

## 2020-11-24 DIAGNOSIS — I5023 Acute on chronic systolic (congestive) heart failure: Secondary | ICD-10-CM | POA: Diagnosis not present

## 2020-11-24 DIAGNOSIS — E8779 Other fluid overload: Secondary | ICD-10-CM | POA: Diagnosis not present

## 2020-11-24 DIAGNOSIS — E119 Type 2 diabetes mellitus without complications: Secondary | ICD-10-CM | POA: Diagnosis not present

## 2020-11-24 DIAGNOSIS — E8770 Fluid overload, unspecified: Secondary | ICD-10-CM | POA: Diagnosis not present

## 2020-11-24 DIAGNOSIS — R570 Cardiogenic shock: Secondary | ICD-10-CM | POA: Diagnosis not present

## 2020-11-24 DIAGNOSIS — R188 Other ascites: Secondary | ICD-10-CM | POA: Diagnosis not present

## 2020-11-24 DIAGNOSIS — I110 Hypertensive heart disease with heart failure: Secondary | ICD-10-CM | POA: Diagnosis not present

## 2020-11-24 DIAGNOSIS — E11649 Type 2 diabetes mellitus with hypoglycemia without coma: Secondary | ICD-10-CM | POA: Diagnosis not present

## 2020-11-24 DIAGNOSIS — I350 Nonrheumatic aortic (valve) stenosis: Secondary | ICD-10-CM | POA: Diagnosis not present

## 2020-11-25 DIAGNOSIS — E785 Hyperlipidemia, unspecified: Secondary | ICD-10-CM | POA: Diagnosis not present

## 2020-11-25 DIAGNOSIS — R570 Cardiogenic shock: Secondary | ICD-10-CM | POA: Diagnosis not present

## 2020-11-25 DIAGNOSIS — N390 Urinary tract infection, site not specified: Secondary | ICD-10-CM | POA: Diagnosis not present

## 2020-11-25 DIAGNOSIS — I5023 Acute on chronic systolic (congestive) heart failure: Secondary | ICD-10-CM | POA: Diagnosis not present

## 2020-11-25 DIAGNOSIS — N179 Acute kidney failure, unspecified: Secondary | ICD-10-CM | POA: Diagnosis not present

## 2020-11-25 DIAGNOSIS — B9620 Unspecified Escherichia coli [E. coli] as the cause of diseases classified elsewhere: Secondary | ICD-10-CM | POA: Diagnosis not present

## 2020-11-25 DIAGNOSIS — J9601 Acute respiratory failure with hypoxia: Secondary | ICD-10-CM | POA: Diagnosis not present

## 2020-11-25 DIAGNOSIS — E8770 Fluid overload, unspecified: Secondary | ICD-10-CM | POA: Diagnosis not present

## 2020-11-25 DIAGNOSIS — E119 Type 2 diabetes mellitus without complications: Secondary | ICD-10-CM | POA: Diagnosis not present

## 2020-11-25 DIAGNOSIS — Z6841 Body Mass Index (BMI) 40.0 and over, adult: Secondary | ICD-10-CM | POA: Diagnosis not present

## 2020-11-25 DIAGNOSIS — Z952 Presence of prosthetic heart valve: Secondary | ICD-10-CM | POA: Diagnosis not present

## 2020-11-25 DIAGNOSIS — I350 Nonrheumatic aortic (valve) stenosis: Secondary | ICD-10-CM | POA: Diagnosis not present

## 2020-11-25 DIAGNOSIS — I5043 Acute on chronic combined systolic (congestive) and diastolic (congestive) heart failure: Secondary | ICD-10-CM | POA: Diagnosis not present

## 2020-11-25 DIAGNOSIS — I110 Hypertensive heart disease with heart failure: Secondary | ICD-10-CM | POA: Diagnosis not present

## 2020-11-25 DIAGNOSIS — R188 Other ascites: Secondary | ICD-10-CM | POA: Diagnosis not present

## 2020-11-26 DIAGNOSIS — E8770 Fluid overload, unspecified: Secondary | ICD-10-CM | POA: Diagnosis not present

## 2020-11-26 DIAGNOSIS — R570 Cardiogenic shock: Secondary | ICD-10-CM | POA: Diagnosis not present

## 2020-11-26 DIAGNOSIS — E785 Hyperlipidemia, unspecified: Secondary | ICD-10-CM | POA: Diagnosis not present

## 2020-11-26 DIAGNOSIS — Z6841 Body Mass Index (BMI) 40.0 and over, adult: Secondary | ICD-10-CM | POA: Diagnosis not present

## 2020-11-26 DIAGNOSIS — B9620 Unspecified Escherichia coli [E. coli] as the cause of diseases classified elsewhere: Secondary | ICD-10-CM | POA: Diagnosis not present

## 2020-11-26 DIAGNOSIS — J9601 Acute respiratory failure with hypoxia: Secondary | ICD-10-CM | POA: Diagnosis not present

## 2020-11-26 DIAGNOSIS — R188 Other ascites: Secondary | ICD-10-CM | POA: Diagnosis not present

## 2020-11-26 DIAGNOSIS — I5043 Acute on chronic combined systolic (congestive) and diastolic (congestive) heart failure: Secondary | ICD-10-CM | POA: Diagnosis not present

## 2020-11-26 DIAGNOSIS — I350 Nonrheumatic aortic (valve) stenosis: Secondary | ICD-10-CM | POA: Diagnosis not present

## 2020-11-26 DIAGNOSIS — N179 Acute kidney failure, unspecified: Secondary | ICD-10-CM | POA: Diagnosis not present

## 2020-11-26 DIAGNOSIS — N390 Urinary tract infection, site not specified: Secondary | ICD-10-CM | POA: Diagnosis not present

## 2020-11-26 DIAGNOSIS — I110 Hypertensive heart disease with heart failure: Secondary | ICD-10-CM | POA: Diagnosis not present

## 2020-11-26 DIAGNOSIS — E119 Type 2 diabetes mellitus without complications: Secondary | ICD-10-CM | POA: Diagnosis not present

## 2020-11-26 DIAGNOSIS — Z952 Presence of prosthetic heart valve: Secondary | ICD-10-CM | POA: Diagnosis not present

## 2020-11-26 DIAGNOSIS — I5023 Acute on chronic systolic (congestive) heart failure: Secondary | ICD-10-CM | POA: Diagnosis not present

## 2020-11-27 DIAGNOSIS — E8770 Fluid overload, unspecified: Secondary | ICD-10-CM | POA: Diagnosis not present

## 2020-11-27 DIAGNOSIS — E119 Type 2 diabetes mellitus without complications: Secondary | ICD-10-CM | POA: Diagnosis not present

## 2020-11-27 DIAGNOSIS — Z7409 Other reduced mobility: Secondary | ICD-10-CM | POA: Diagnosis not present

## 2020-11-27 DIAGNOSIS — I350 Nonrheumatic aortic (valve) stenosis: Secondary | ICD-10-CM | POA: Diagnosis not present

## 2020-11-27 DIAGNOSIS — J9601 Acute respiratory failure with hypoxia: Secondary | ICD-10-CM | POA: Diagnosis not present

## 2020-11-27 DIAGNOSIS — R570 Cardiogenic shock: Secondary | ICD-10-CM | POA: Diagnosis not present

## 2020-11-27 DIAGNOSIS — B9620 Unspecified Escherichia coli [E. coli] as the cause of diseases classified elsewhere: Secondary | ICD-10-CM | POA: Diagnosis not present

## 2020-11-27 DIAGNOSIS — Z952 Presence of prosthetic heart valve: Secondary | ICD-10-CM | POA: Diagnosis not present

## 2020-11-27 DIAGNOSIS — I110 Hypertensive heart disease with heart failure: Secondary | ICD-10-CM | POA: Diagnosis not present

## 2020-11-27 DIAGNOSIS — N179 Acute kidney failure, unspecified: Secondary | ICD-10-CM | POA: Diagnosis not present

## 2020-11-27 DIAGNOSIS — I509 Heart failure, unspecified: Secondary | ICD-10-CM | POA: Diagnosis not present

## 2020-11-27 DIAGNOSIS — N390 Urinary tract infection, site not specified: Secondary | ICD-10-CM | POA: Diagnosis not present

## 2020-11-27 DIAGNOSIS — R188 Other ascites: Secondary | ICD-10-CM | POA: Diagnosis not present

## 2020-11-27 DIAGNOSIS — I429 Cardiomyopathy, unspecified: Secondary | ICD-10-CM | POA: Diagnosis not present

## 2020-11-27 DIAGNOSIS — I5023 Acute on chronic systolic (congestive) heart failure: Secondary | ICD-10-CM | POA: Diagnosis not present

## 2020-11-27 DIAGNOSIS — I5043 Acute on chronic combined systolic (congestive) and diastolic (congestive) heart failure: Secondary | ICD-10-CM | POA: Diagnosis not present

## 2020-11-27 DIAGNOSIS — Z6841 Body Mass Index (BMI) 40.0 and over, adult: Secondary | ICD-10-CM | POA: Diagnosis not present

## 2020-11-27 DIAGNOSIS — I469 Cardiac arrest, cause unspecified: Secondary | ICD-10-CM | POA: Diagnosis not present

## 2020-11-27 DIAGNOSIS — J90 Pleural effusion, not elsewhere classified: Secondary | ICD-10-CM | POA: Diagnosis not present

## 2020-11-27 DIAGNOSIS — E785 Hyperlipidemia, unspecified: Secondary | ICD-10-CM | POA: Diagnosis not present

## 2020-11-28 DIAGNOSIS — E119 Type 2 diabetes mellitus without complications: Secondary | ICD-10-CM | POA: Diagnosis not present

## 2020-11-28 DIAGNOSIS — Z952 Presence of prosthetic heart valve: Secondary | ICD-10-CM | POA: Diagnosis not present

## 2020-11-28 DIAGNOSIS — R570 Cardiogenic shock: Secondary | ICD-10-CM | POA: Diagnosis not present

## 2020-11-28 DIAGNOSIS — Z6841 Body Mass Index (BMI) 40.0 and over, adult: Secondary | ICD-10-CM | POA: Diagnosis not present

## 2020-11-28 DIAGNOSIS — N179 Acute kidney failure, unspecified: Secondary | ICD-10-CM | POA: Diagnosis not present

## 2020-11-28 DIAGNOSIS — I350 Nonrheumatic aortic (valve) stenosis: Secondary | ICD-10-CM | POA: Diagnosis not present

## 2020-11-28 DIAGNOSIS — R188 Other ascites: Secondary | ICD-10-CM | POA: Diagnosis not present

## 2020-11-28 DIAGNOSIS — I110 Hypertensive heart disease with heart failure: Secondary | ICD-10-CM | POA: Diagnosis not present

## 2020-11-28 DIAGNOSIS — I509 Heart failure, unspecified: Secondary | ICD-10-CM | POA: Diagnosis not present

## 2020-11-28 DIAGNOSIS — I5023 Acute on chronic systolic (congestive) heart failure: Secondary | ICD-10-CM | POA: Diagnosis not present

## 2020-11-28 DIAGNOSIS — E8770 Fluid overload, unspecified: Secondary | ICD-10-CM | POA: Diagnosis not present

## 2020-11-28 DIAGNOSIS — E785 Hyperlipidemia, unspecified: Secondary | ICD-10-CM | POA: Diagnosis not present

## 2020-11-29 DIAGNOSIS — B9620 Unspecified Escherichia coli [E. coli] as the cause of diseases classified elsewhere: Secondary | ICD-10-CM | POA: Diagnosis not present

## 2020-11-29 DIAGNOSIS — I110 Hypertensive heart disease with heart failure: Secondary | ICD-10-CM | POA: Diagnosis not present

## 2020-11-29 DIAGNOSIS — R570 Cardiogenic shock: Secondary | ICD-10-CM | POA: Diagnosis not present

## 2020-11-29 DIAGNOSIS — J9601 Acute respiratory failure with hypoxia: Secondary | ICD-10-CM | POA: Diagnosis not present

## 2020-11-29 DIAGNOSIS — E873 Alkalosis: Secondary | ICD-10-CM | POA: Diagnosis not present

## 2020-11-29 DIAGNOSIS — I350 Nonrheumatic aortic (valve) stenosis: Secondary | ICD-10-CM | POA: Diagnosis not present

## 2020-11-29 DIAGNOSIS — Z952 Presence of prosthetic heart valve: Secondary | ICD-10-CM | POA: Diagnosis not present

## 2020-11-29 DIAGNOSIS — E785 Hyperlipidemia, unspecified: Secondary | ICD-10-CM | POA: Diagnosis not present

## 2020-11-29 DIAGNOSIS — R188 Other ascites: Secondary | ICD-10-CM | POA: Diagnosis not present

## 2020-11-29 DIAGNOSIS — E119 Type 2 diabetes mellitus without complications: Secondary | ICD-10-CM | POA: Diagnosis not present

## 2020-11-29 DIAGNOSIS — I5023 Acute on chronic systolic (congestive) heart failure: Secondary | ICD-10-CM | POA: Diagnosis not present

## 2020-11-29 DIAGNOSIS — N390 Urinary tract infection, site not specified: Secondary | ICD-10-CM | POA: Diagnosis not present

## 2020-11-29 DIAGNOSIS — I509 Heart failure, unspecified: Secondary | ICD-10-CM | POA: Diagnosis not present

## 2020-11-29 DIAGNOSIS — Z6841 Body Mass Index (BMI) 40.0 and over, adult: Secondary | ICD-10-CM | POA: Diagnosis not present

## 2020-11-30 DIAGNOSIS — N179 Acute kidney failure, unspecified: Secondary | ICD-10-CM | POA: Diagnosis not present

## 2020-11-30 DIAGNOSIS — I130 Hypertensive heart and chronic kidney disease with heart failure and stage 1 through stage 4 chronic kidney disease, or unspecified chronic kidney disease: Secondary | ICD-10-CM | POA: Diagnosis not present

## 2020-11-30 DIAGNOSIS — E785 Hyperlipidemia, unspecified: Secondary | ICD-10-CM | POA: Diagnosis not present

## 2020-11-30 DIAGNOSIS — R188 Other ascites: Secondary | ICD-10-CM | POA: Diagnosis not present

## 2020-11-30 DIAGNOSIS — I350 Nonrheumatic aortic (valve) stenosis: Secondary | ICD-10-CM | POA: Diagnosis not present

## 2020-11-30 DIAGNOSIS — I509 Heart failure, unspecified: Secondary | ICD-10-CM | POA: Diagnosis not present

## 2020-11-30 DIAGNOSIS — R570 Cardiogenic shock: Secondary | ICD-10-CM | POA: Diagnosis not present

## 2020-11-30 DIAGNOSIS — K8020 Calculus of gallbladder without cholecystitis without obstruction: Secondary | ICD-10-CM | POA: Diagnosis not present

## 2020-11-30 DIAGNOSIS — Z952 Presence of prosthetic heart valve: Secondary | ICD-10-CM | POA: Diagnosis not present

## 2020-11-30 DIAGNOSIS — N189 Chronic kidney disease, unspecified: Secondary | ICD-10-CM | POA: Diagnosis not present

## 2020-11-30 DIAGNOSIS — I5023 Acute on chronic systolic (congestive) heart failure: Secondary | ICD-10-CM | POA: Diagnosis not present

## 2020-11-30 DIAGNOSIS — Z6841 Body Mass Index (BMI) 40.0 and over, adult: Secondary | ICD-10-CM | POA: Diagnosis not present

## 2020-11-30 DIAGNOSIS — R1011 Right upper quadrant pain: Secondary | ICD-10-CM | POA: Diagnosis not present

## 2020-11-30 DIAGNOSIS — E1122 Type 2 diabetes mellitus with diabetic chronic kidney disease: Secondary | ICD-10-CM | POA: Diagnosis not present

## 2020-12-01 DIAGNOSIS — Z79899 Other long term (current) drug therapy: Secondary | ICD-10-CM | POA: Diagnosis not present

## 2020-12-01 DIAGNOSIS — R0600 Dyspnea, unspecified: Secondary | ICD-10-CM | POA: Diagnosis not present

## 2020-12-01 DIAGNOSIS — I4510 Unspecified right bundle-branch block: Secondary | ICD-10-CM | POA: Diagnosis not present

## 2020-12-01 DIAGNOSIS — J811 Chronic pulmonary edema: Secondary | ICD-10-CM | POA: Diagnosis not present

## 2020-12-01 DIAGNOSIS — I340 Nonrheumatic mitral (valve) insufficiency: Secondary | ICD-10-CM | POA: Diagnosis not present

## 2020-12-01 DIAGNOSIS — J90 Pleural effusion, not elsewhere classified: Secondary | ICD-10-CM | POA: Diagnosis not present

## 2020-12-01 DIAGNOSIS — I447 Left bundle-branch block, unspecified: Secondary | ICD-10-CM | POA: Diagnosis not present

## 2020-12-01 DIAGNOSIS — I517 Cardiomegaly: Secondary | ICD-10-CM | POA: Diagnosis not present

## 2020-12-01 DIAGNOSIS — I5023 Acute on chronic systolic (congestive) heart failure: Secondary | ICD-10-CM | POA: Diagnosis not present

## 2020-12-04 ENCOUNTER — Other Ambulatory Visit: Payer: Self-pay | Admitting: Family Medicine

## 2020-12-04 NOTE — Telephone Encounter (Signed)
Requesting: [email or web address] Contract: 03/27/20 UDS: N/A Last Visit:06/25/20 Next Visit: advised to f/u June Last Refill:  04/22/20(90,1)  Please review and advise, med pending

## 2020-12-13 DIAGNOSIS — R279 Unspecified lack of coordination: Secondary | ICD-10-CM | POA: Diagnosis not present

## 2020-12-13 DIAGNOSIS — Z743 Need for continuous supervision: Secondary | ICD-10-CM | POA: Diagnosis not present

## 2020-12-17 DEATH — deceased

## 2022-05-05 IMAGING — DX DG CHEST 2V
2 series · 2 of 2 positions shown · non-contrast
Comparison: 03/29/2019

CLINICAL DATA: Cough and wheezing for several weeks

EXAM:
CHEST - 2 VIEW

[chest pa]
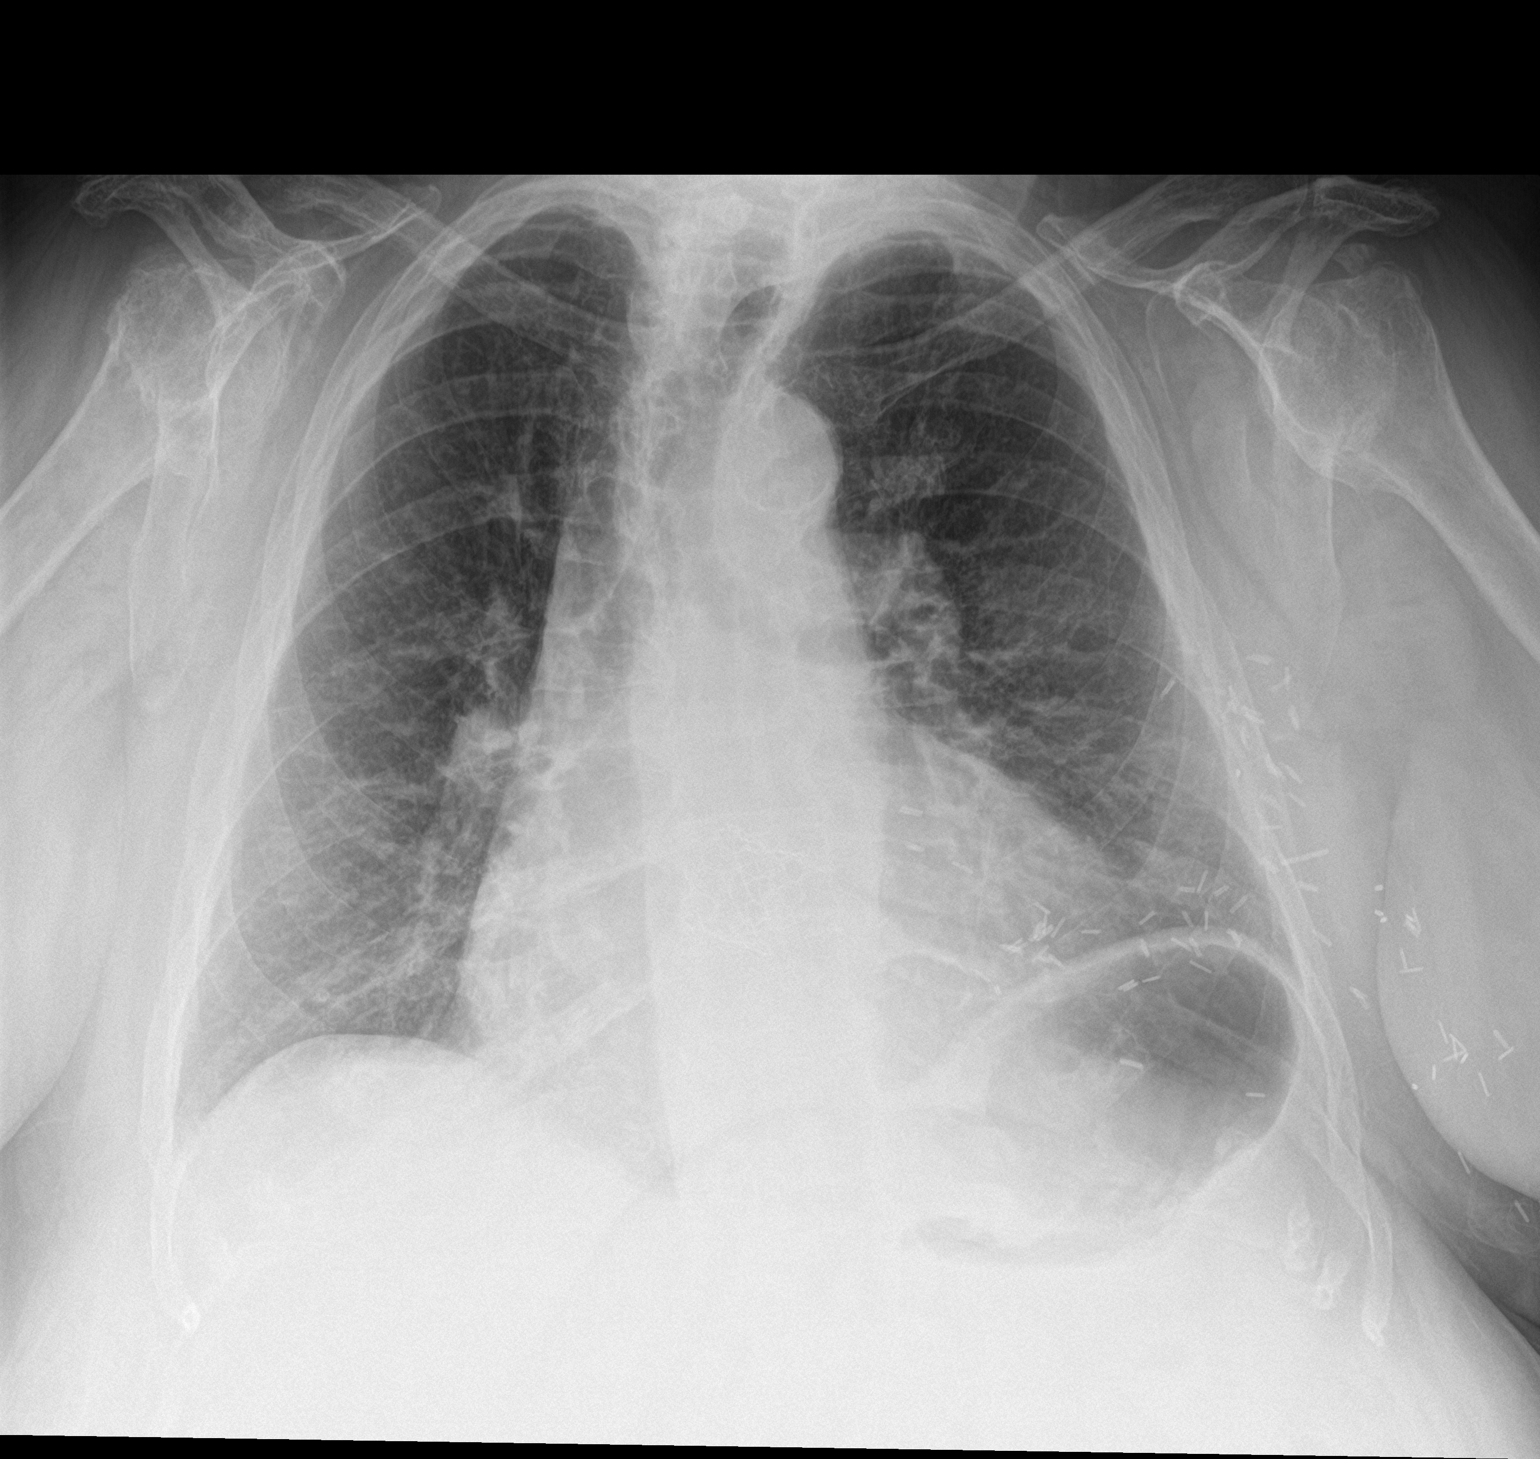

[chest lat]
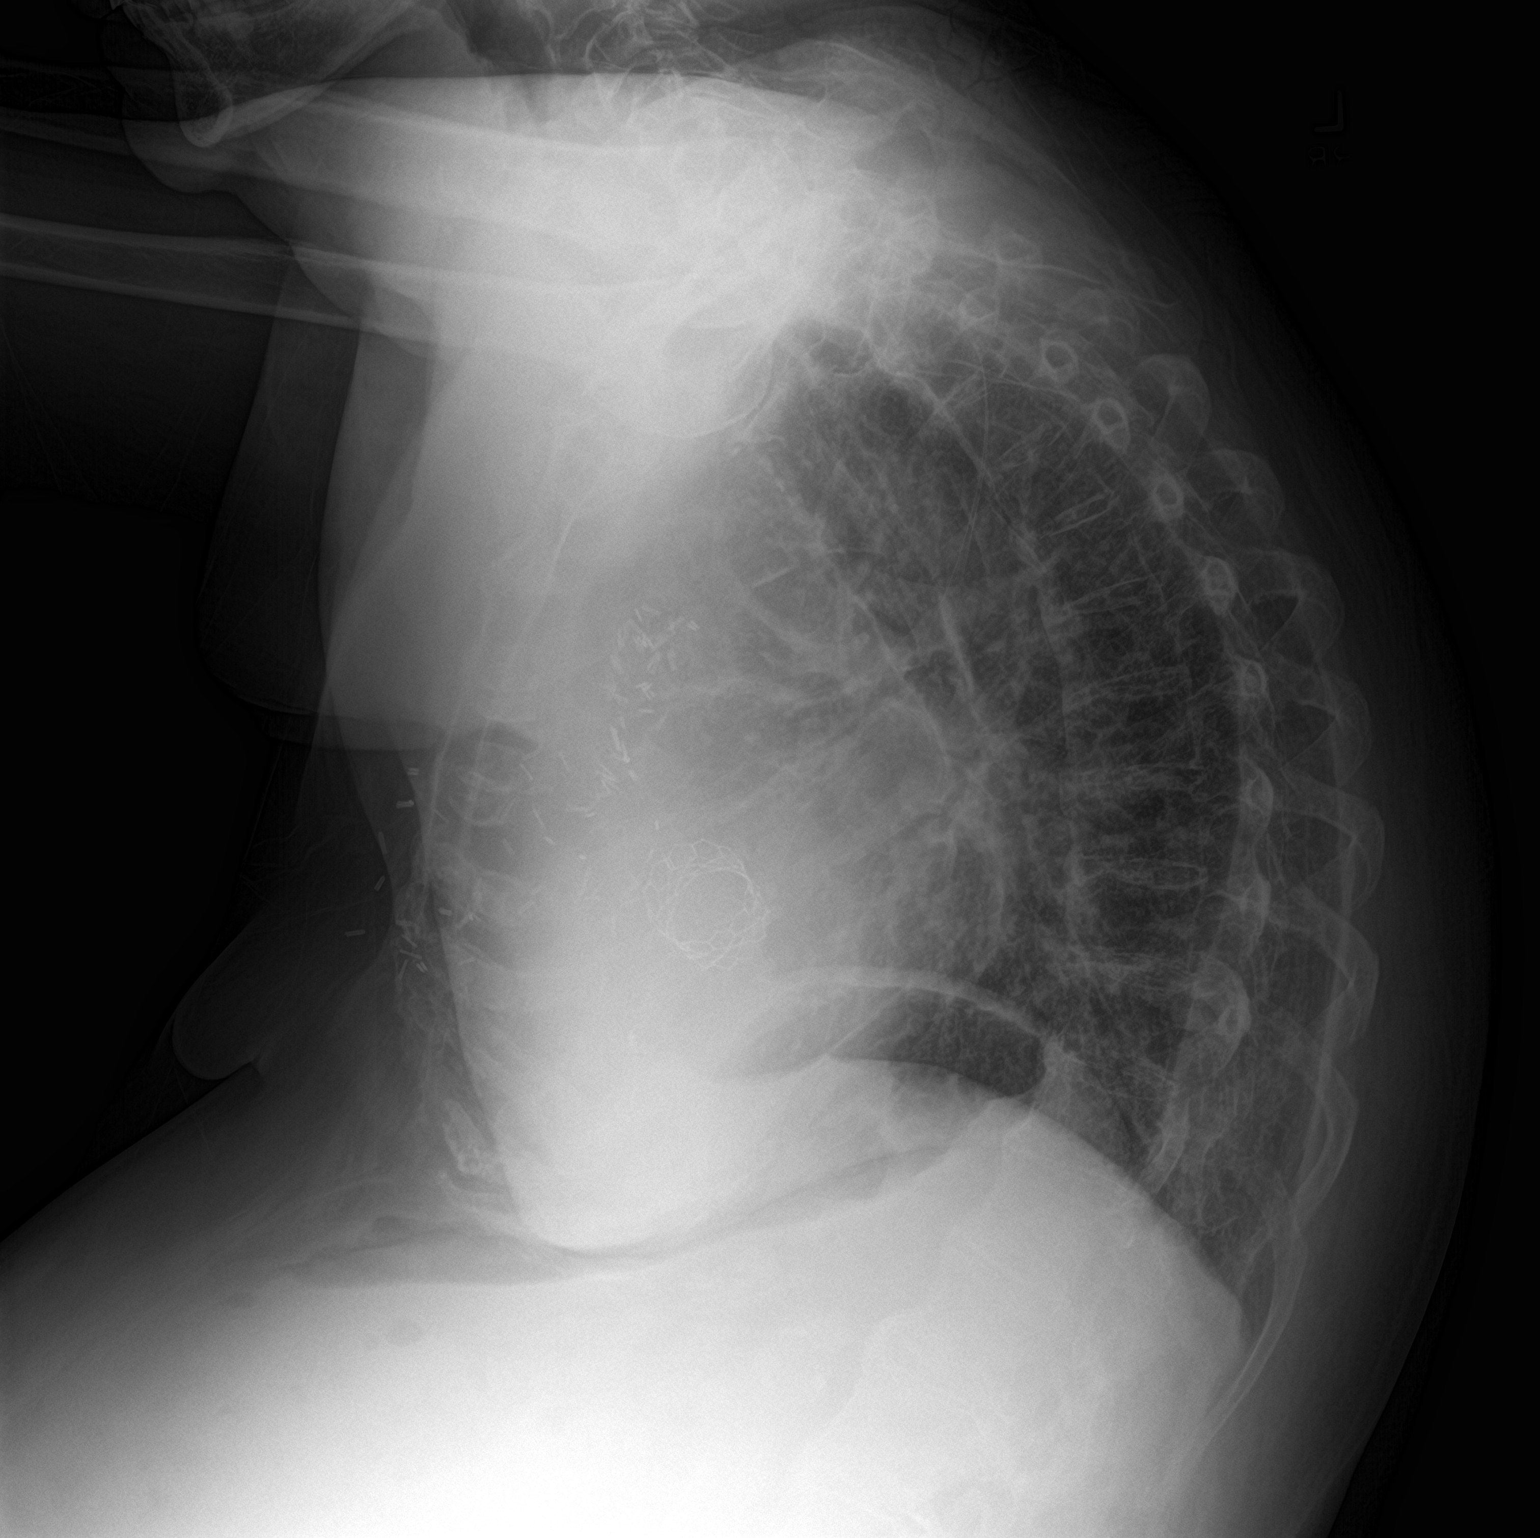

[2 of 2 positions shown; findings below may reference images not displayed]

FINDINGS: Cardiac shadow is enlarged. Aortic calcifications are again noted.
Postsurgical changes are noted in the left chest wall. No focal
infiltrate or sizable effusion is seen. Chronic interstitial changes
are again seen. No acute bony abnormality is noted. Changes of prior
TAVR are seen.
IMPRESSION: Chronic interstitial changes stable from the prior exam. No acute
abnormality noted.
